# Patient Record
Sex: Female | Born: 1995 | Race: White | Hispanic: No | Marital: Single | Smoking: Current some day smoker
Health system: Southern US, Community
[De-identification: ages and names within clinical notes are randomized; demographics above are authoritative.]

## PROBLEM LIST (undated history)

## (undated) DIAGNOSIS — F419 Anxiety disorder, unspecified: Secondary | ICD-10-CM

## (undated) DIAGNOSIS — F909 Attention-deficit hyperactivity disorder, unspecified type: Secondary | ICD-10-CM

## (undated) DIAGNOSIS — R011 Cardiac murmur, unspecified: Secondary | ICD-10-CM

## (undated) DIAGNOSIS — F32A Depression, unspecified: Secondary | ICD-10-CM

## (undated) DIAGNOSIS — G43909 Migraine, unspecified, not intractable, without status migrainosus: Secondary | ICD-10-CM

## (undated) DIAGNOSIS — F329 Major depressive disorder, single episode, unspecified: Secondary | ICD-10-CM

## (undated) HISTORY — DX: Migraine, unspecified, not intractable, without status migrainosus: G43.909

## (undated) HISTORY — PX: BIOPSY THYROID: PRO38

## (undated) HISTORY — DX: Major depressive disorder, single episode, unspecified: F32.9

## (undated) HISTORY — DX: Attention-deficit hyperactivity disorder, unspecified type: F90.9

## (undated) HISTORY — DX: Anxiety disorder, unspecified: F41.9

## (undated) HISTORY — DX: Cardiac murmur, unspecified: R01.1

## (undated) HISTORY — DX: Depression, unspecified: F32.A

---
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
## 2016-03-15 ENCOUNTER — Ambulatory Visit: Payer: BLUE CROSS/BLUE SHIELD | Admitting: Certified Nurse Midwife

## 2016-03-16 ENCOUNTER — Ambulatory Visit (INDEPENDENT_AMBULATORY_CARE_PROVIDER_SITE_OTHER): Payer: BLUE CROSS/BLUE SHIELD | Admitting: Certified Nurse Midwife

## 2016-03-16 ENCOUNTER — Encounter: Payer: Self-pay | Admitting: Certified Nurse Midwife

## 2016-03-16 VITALS — BP 110/70 | HR 70 | Resp 16 | Ht 68.25 in | Wt 156.0 lb

## 2016-03-16 DIAGNOSIS — Z01419 Encounter for gynecological examination (general) (routine) without abnormal findings: Secondary | ICD-10-CM

## 2016-03-16 DIAGNOSIS — K5900 Constipation, unspecified: Secondary | ICD-10-CM | POA: Diagnosis not present

## 2016-03-16 DIAGNOSIS — Z0000 Encounter for general adult medical examination without abnormal findings: Secondary | ICD-10-CM

## 2016-03-16 LAB — POCT URINALYSIS DIPSTICK
Bilirubin, UA: NEGATIVE
Blood, UA: NEGATIVE
Glucose, UA: NEGATIVE
Ketones, UA: NEGATIVE
Leukocytes, UA: NEGATIVE
Nitrite, UA: NEGATIVE
Protein, UA: NEGATIVE
Urobilinogen, UA: NEGATIVE
pH, UA: 5

## 2016-03-16 NOTE — Patient Instructions (Signed)
General topics  Next pap or exam is  due in 1 year Take a Women's multivitamin Take 1200 mg. of calcium daily - prefer dietary If any concerns in interim to call back  Breast Self-Awareness Practicing breast self-awareness may pick up problems early, prevent significant medical complications, and possibly save your life. By practicing breast self-awareness, you can become familiar with how your breasts look and feel and if your breasts are changing. This allows you to notice changes early. It can also offer you some reassurance that your breast health is good. One way to learn what is normal for your breasts and whether your breasts are changing is to do a breast self-exam. If you find a lump or something that was not present in the past, it is best to contact your caregiver right away. Other findings that should be evaluated by your caregiver include nipple discharge, especially if it is bloody; skin changes or reddening; areas where the skin seems to be pulled in (retracted); or new lumps and bumps. Breast pain is seldom associated with cancer (malignancy), but should also be evaluated by a caregiver. BREAST SELF-EXAM The best time to examine your breasts is 5 7 days after your menstrual period is over.  ExitCare Patient Information 2013 [employer].   Exercise to Stay Healthy Exercise helps you become and stay healthy. EXERCISE IDEAS AND TIPS Choose exercises that:  You enjoy.  Fit into your day. You do not need to exercise really hard to be healthy. You can do exercises at a slow or medium level and stay healthy. You can:  Stretch before and after working out.  Try yoga, Pilates, or tai chi.  Lift weights.  Walk fast, swim, jog, run, climb stairs, bicycle, dance, or rollerskate.  Take aerobic classes. Exercises that burn about 150 calories:  Running 1  miles in 15 minutes.  Playing volleyball for 45 to 60 minutes.  Washing and waxing a car for 45 to 60  minutes.  Playing touch football for 45 minutes.  Walking 1  miles in 35 minutes.  Pushing a stroller 1  miles in 30 minutes.  Playing basketball for 30 minutes.  Raking leaves for 30 minutes.  Bicycling 5 miles in 30 minutes.  Walking 2 miles in 30 minutes.  Dancing for 30 minutes.  Shoveling snow for 15 minutes.  Swimming laps for 20 minutes.  Walking up stairs for 15 minutes.  Bicycling 4 miles in 15 minutes.  Gardening for 30 to 45 minutes.  Jumping rope for 15 minutes.  Washing windows or floors for 45 to 60 minutes. Document Released: 01/15/2011 Document Revised: 03/06/2012 Document Reviewed: 01/15/2011 ExitCare Patient Information 2013 ExitCare[employer].   Other topics ( that may be useful information):    Sexually Transmitted Disease Sexually transmitted disease (STD) refers to any infection that is passed from person to person during sexual activity. This may happen by way of saliva, semen, blood, vaginal mucus, or urine. Common STDs include:  Gonorrhea.  Chlamydia.  Syphilis.  HIV/AIDS.  Genital herpes.  Hepatitis B and C.  Trichomonas.  Human papillomavirus (HPV).  Pubic lice. CAUSES  An STD may be spread by bacteria, virus, or parasite. A person can get an STD by:  Sexual intercourse with an infected person.  Sharing sex toys with an infected person.  Sharing needles with an infected person.  Having intimate contact with the genitals, mouth, or rectal areas of an infected person. SYMPTOMS  Some people may not have any symptoms, but   they can still pass the infection to others. Different STDs have different symptoms. Symptoms include:  Painful or bloody urination.  Pain in the pelvis, abdomen, vagina, anus, throat, or eyes.  Skin rash, itching, irritation, growths, or sores (lesions). These usually occur in the genital or anal area.  Abnormal vaginal discharge.  Penile discharge in men.  Soft, flesh-colored skin growths in the  genital or anal area.  Fever.  Pain or bleeding during sexual intercourse.  Swollen glands in the groin area.  Yellow skin and eyes (jaundice). This is seen with hepatitis. DIAGNOSIS  To make a diagnosis, your caregiver may:  Take a medical history.  Perform a physical exam.  Take a specimen (culture) to be examined.  Examine a sample of discharge under a microscope.  Perform blood test TREATMENT   Chlamydia, gonorrhea, trichomonas, and syphilis can be cured with antibiotic medicine.  Genital herpes, hepatitis, and HIV can be treated, but not cured, with prescribed medicines. The medicines will lessen the symptoms.  Genital warts from HPV can be treated with medicine or by freezing, burning (electrocautery), or surgery. Warts may come back.  HPV is a virus and cannot be cured with medicine or surgery.However, abnormal areas may be followed very closely by your caregiver and may be removed from the cervix, vagina, or vulva through office procedures or surgery. If your diagnosis is confirmed, your recent sexual partners need treatment. This is true even if they are symptom-free or have a negative culture or evaluation. They should not have sex until their caregiver says it is okay. HOME CARE INSTRUCTIONS  All sexual partners should be informed, tested, and treated for all STDs.  Take your antibiotics as directed. Finish them even if you start to feel better.  Only take over-the-counter or prescription medicines for pain, discomfort, or fever as directed by your caregiver.  Rest.  Eat a balanced diet and drink enough fluids to keep your urine clear or pale yellow.  Do not have sex until treatment is completed and you have followed up with your caregiver. STDs should be checked after treatment.  Keep all follow-up appointments, Pap tests, and blood tests as directed by your caregiver.  Only use latex condoms and water-soluble lubricants during sexual activity. Do not use  petroleum jelly or oils.  Avoid alcohol and illegal drugs.  Get vaccinated for HPV and hepatitis. If you have not received these vaccines in the past, talk to your caregiver about whether one or both might be right for you.  Avoid risky sex practices that can break the skin. The only way to avoid getting an STD is to avoid all sexual activity.Latex condoms and dental dams (for oral sex) will help lessen the risk of getting an STD, but will not completely eliminate the risk. SEEK MEDICAL CARE IF:   You have a fever.  You have any new or worsening symptoms. Document Released: 03/05/2003 Document Revised: 03/06/2012 Document Reviewed: 03/12/2011 [facility] Patient Information 2013 [place].    Domestic Abuse You are being battered or abused if someone close to you hits, pushes, or physically hurts you in any way. You also are being abused if you are forced into activities. You are being sexually abused if you are forced to have sexual contact of any kind. You are being emotionally abused if you are made to feel worthless or if you are constantly threatened. It is important to remember that help is available. No one has the right to abuse you. PREVENTION OF FURTHER  ABUSE  Learn the warning signs of danger. This varies with situations but may include: the use of alcohol, threats, isolation from friends and family, or forced sexual contact. Leave if you feel that violence is going to occur.  If you are attacked or beaten, report it to the police so the abuse is documented. You do not have to press charges. The police can protect you while you or the attackers are leaving. Get the officer's name and badge number and a copy of the report.  Find someone you can trust and tell them what is happening to you: your caregiver, a nurse, clergy member, close friend or family member. Feeling ashamed is natural, but remember that you have done nothing wrong. No one deserves abuse. Document Released:  12/10/2000 Document Revised: 03/06/2012 Document Reviewed: 02/18/2011 ExitCare Patient Information 2013 ExitCare[employer].    How Much is Too Much Alcohol? Drinking too much alcohol can cause injury, accidents, and health problems. These types of problems can include:   Car crashes.  Falls.  Family fighting (domestic violence).  Drowning.  Fights.  Injuries.  Burns.  Damage to certain organs.  Having a baby with birth defects. ONE DRINK CAN BE TOO MUCH WHEN YOU ARE:  Working.  Pregnant or breastfeeding.  Taking medicines. Ask your doctor.  Driving or planning to drive. If you or someone you know has a drinking problem, get help from a doctor.  Document Released: 10/09/2009 Document Revised: 03/06/2012 Document Reviewed: 10/09/2009 ExitCare Patient Information 2013 ExitCare[employer].   Smoking Hazards Smoking cigarettes is extremely bad for your health. Tobacco smoke has over 200 known poisons in it. There are over 60 chemicals in tobacco smoke that cause cancer. Some of the chemicals found in cigarette smoke include:   Cyanide.  Benzene.  Formaldehyde.  Methanol (wood alcohol).  Acetylene (fuel used in welding torches).  Ammonia. Cigarette smoke also contains the poisonous gases nitrogen oxide and carbon monoxide.  Cigarette smokers have an increased risk of many serious medical problems and Smoking causes approximately:  90% of all lung cancer deaths in men.  80% of all lung cancer deaths in women.  90% of deaths from chronic obstructive lung disease. Compared with nonsmokers, smoking increases the risk of:  Coronary heart disease by 2 to 4 times.  Stroke by 2 to 4 times.  Men developing lung cancer by 23 times.  Women developing lung cancer by 13 times.  Dying from chronic obstructive lung diseases by 12 times.  . Smoking is the most preventable cause of death and disease in our society.  WHY IS SMOKING ADDICTIVE?  Nicotine is the chemical  agent in tobacco that is capable of causing addiction or dependence.  When you smoke and inhale, nicotine is absorbed rapidly into the bloodstream through your lungs. Nicotine absorbed through the lungs is capable of creating a powerful addiction. Both inhaled and non-inhaled nicotine may be addictive.  Addiction studies of cigarettes and spit tobacco show that addiction to nicotine occurs mainly during the teen years, when young people begin using tobacco products. WHAT ARE THE BENEFITS OF QUITTING?  There are many health benefits to quitting smoking.   Likelihood of developing cancer and heart disease decreases. Health improvements are seen almost immediately.  Blood pressure, pulse rate, and breathing patterns start returning to normal soon after quitting. QUITTING SMOKING   American Lung Association - 1-800-LUNGUSA  American Cancer Society - 1-800-ACS-2345 Document Released: 01/20/2005 Document Revised: 03/06/2012 Document Reviewed: 09/24/2009 ExitCare Patient Information 2013 ExitCare,   LLC.   Stress Management Stress is a state of physical or mental tension that often results from changes in your life or normal routine. Some common causes of stress are:  Death of a loved one.  Injuries or severe illnesses.  Getting fired or changing jobs.  Moving into a new home. Other causes may be:  Sexual problems.  Business or financial losses.  Taking on a large debt.  Regular conflict with someone at home or at work.  Constant tiredness from lack of sleep. It is not just bad things that are stressful. It may be stressful to:  Win the lottery.  Get married.  Buy a new car. The amount of stress that can be easily tolerated varies from person to person. Changes generally cause stress, regardless of the types of change. Too much stress can affect your health. It may lead to physical or emotional problems. Too little stress (boredom) may also become stressful. SUGGESTIONS TO  REDUCE STRESS:  Talk things over with your family and friends. It often is helpful to share your concerns and worries. If you feel your problem is serious, you may want to get help from a professional counselor.  Consider your problems one at a time instead of lumping them all together. Trying to take care of everything at once may seem impossible. List all the things you need to do and then start with the most important one. Set a goal to accomplish 2 or 3 things each day. If you expect to do too many in a single day you will naturally fail, causing you to feel even more stressed.  Do not use alcohol or drugs to relieve stress. Although you may feel better for a short time, they do not remove the problems that caused the stress. They can also be habit forming.  Exercise regularly - at least 3 times per week. Physical exercise can help to relieve that "uptight" feeling and will relax you.  The shortest distance between despair and hope is often a good night's sleep.  Go to bed and get up on time allowing yourself time for appointments without being rushed.  Take a short "time-out" period from any stressful situation that occurs during the day. Close your eyes and take some deep breaths. Starting with the muscles in your face, tense them, hold it for a few seconds, then relax. Repeat this with the muscles in your neck, shoulders, hand, stomach, back and legs.  Take good care of yourself. Eat a balanced diet and get plenty of rest.  Schedule time for having fun. Take a break from your daily routine to relax. HOME CARE INSTRUCTIONS   Call if you feel overwhelmed by your problems and feel you can no longer manage them on your own.  Return immediately if you feel like hurting yourself or someone else. Document Released: 06/08/2001 Document Revised: 03/06/2012 Document Reviewed: 01/29/2008 [facility] Patient Information 2013 [place].  Constipation, Adult Constipation is when a person has  fewer than three bowel movements a week, has difficulty having a bowel movement, or has stools that are dry, hard, or larger than normal. As people grow older, constipation is more common. A low-fiber diet, not taking in enough fluids, and taking certain medicines may make constipation worse.  CAUSES   Certain medicines, such as antidepressants, pain medicine, iron supplements, antacids, and water pills.   Certain diseases, such as diabetes, irritable bowel syndrome (IBS), thyroid disease, or depression.   Not drinking enough water.   Not  eating enough fiber-rich foods.   Stress or travel.   Lack of physical activity or exercise.   Ignoring the urge to have a bowel movement.   Using laxatives too much.  SIGNS AND SYMPTOMS   Having fewer than three bowel movements a week.   Straining to have a bowel movement.   Having stools that are hard, dry, or larger than normal.   Feeling full or bloated.   Pain in the lower abdomen.   Not feeling relief after having a bowel movement.  DIAGNOSIS  Your health care provider will take a medical history and perform a physical exam. Further testing may be done for severe constipation. Some tests may include:  A barium enema X-ray to examine your rectum, colon, and, sometimes, your small intestine.   A sigmoidoscopy to examine your lower colon.   A colonoscopy to examine your entire colon. TREATMENT  Treatment will depend on the severity of your constipation and what is causing it. Some dietary treatments include drinking more fluids and eating more fiber-rich foods. Lifestyle treatments may include regular exercise. If these diet and lifestyle recommendations do not help, your health care provider may recommend taking over-the-counter laxative medicines to help you have bowel movements. Prescription medicines may be prescribed if over-the-counter medicines do not work.  HOME CARE INSTRUCTIONS   Eat foods that have a lot of  fiber, such as fruits, vegetables, whole grains, and beans.  Limit foods high in fat and processed sugars, such as french fries, hamburgers, cookies, candies, and soda.   A fiber supplement may be added to your diet if you cannot get enough fiber from foods.   Drink enough fluids to keep your urine clear or pale yellow.   Exercise regularly or as directed by your health care provider.   Go to the restroom when you have the urge to go. Do not hold it.   Only take over-the-counter or prescription medicines as directed by your health care provider. Do not take other medicines for constipation without talking to your health care provider first.  [place] IF:   You have bright red blood in your stool.   Your constipation lasts for more than 4 days or gets worse.   You have abdominal or rectal pain.   You have thin, pencil-like stools.   You have unexplained weight loss. MAKE SURE YOU:   Understand these instructions.  Will watch your condition.  Will get help right away if you are not doing well or get worse.   This information is not intended to replace advice given to you by your health care provider. Make sure you discuss any questions you have with your health care provider.   Document Released: 09/10/2004 Document Revised: 01/03/2015 Document Reviewed: 09/24/2013 Elsevier Interactive Patient Education [employer].

## 2016-03-16 NOTE — Progress Notes (Signed)
20 y.o. G0P0000 Single  Caucasian Fe here for annual exam. Periods normal to slightly heavy and then occasional missed period. Patient is experiencing constipation with gas pain and discomfort at times, working on diet to help with. Not sure she wants to stay with IUD, but will at present due to being senior in high school. Denies any pelvic pain, just has noted increase acne and feels related to IUD. Sees PCP prn. Not sexually active, no STD concerns or testing needed. No other health issues today.  No LMP recorded. Patient is not currently having periods (Reason: IUD).          Sexually active: No.  The current method of family planning is IUD.    Exercising: Yes.    [place] Smoker:  no  Health Maintenance: Pap:  none MMG:  none Colonoscopy:  none BMD:   none TDaP:  Less than 6077yrs Shingles: no Pneumonia: no Hep C and HIV: HIV neg 2016 Labs: poct urine-neg, hgb-12.0 Self breast exam: not done   reports that she has never smoked. She has never used smokeless tobacco. She reports that she does not drink alcohol or use illicit drugs.  Past Medical History  Diagnosis Date  . Migraines     with aura  . Anxiety   . Heart murmur     Past Surgical History  Procedure Laterality Date  . Biopsy thyroid      benign  . Skyla      insertion 04-18-15    Current Outpatient Prescriptions  Medication Sig Dispense Refill  . Levonorgestrel (SKYLA) 13.5 MG IUD by Intrauterine route.     No current facility-administered medications for this visit.    Family History  Problem Relation Age of Onset  . Thyroid disease Mother     hypo  . Aneurysm Maternal Aunt   . Aneurysm Maternal Grandmother     ROS:  Pertinent items are noted in HPI.  Otherwise, a comprehensive ROS was negative.  Exam:   BP 110/70 mmHg  Pulse 70  Resp 16  Ht 5' 8.25" (1.734 m)  Wt 156 lb (70.761 kg)  BMI 23.53 kg/m2 Height: 5' 8.25" (173.4 cm) Ht Readings from Last 3 Encounters:  03/16/16 5' 8.25" (1.734 m)  (94 %*, Z = 1.58)  05/23/15 5' 7.75" (1.721 m) (92 %*, Z = 1.40)  04/18/15 5' 8.25" (1.734 m) (95 %*, Z = 1.60)   * Growth percentiles are based on CDC 2-20 Years data.    General appearance: alert, cooperative and appears stated age Head: Normocephalic, without obvious abnormality, atraumatic Neck: no adenopathy, supple, symmetrical, trachea midline and thyroid mild enlargement Lungs: clear to auscultation bilaterally Breasts: normal appearance, no masses or tenderness, No nipple retraction or dimpling, No nipple discharge or bleeding, No axillary or supraclavicular adenopathy Heart: regular rate and rhythm Abdomen: soft, non-tender; no masses,  no organomegaly Extremities: extremities normal, atraumatic, no cyanosis or edema Skin: Skin color, texture, turgor normal. No rashes or lesions Lymph nodes: Cervical, supraclavicular, and axillary nodes normal. No abnormal inguinal nodes palpated Neurologic: Grossly normal   Pelvic: External genitalia:  no lesions              Urethra:  normal appearing urethra with no masses, tenderness or lesions              Bartholin's and Skene's: normal                 Vagina: normal appearing vagina with normal color and discharge,  no lesions              Cervix: normal,non tender, no lesions, IUD string noted in cervix              Pap taken: No. Bimanual Exam:  Uterus:  normal size, contour, position, consistency, mobility, non-tender and anteverted              Adnexa: normal adnexa and no mass, fullness, tenderness               Rectovaginal: Confirms               Anus:  normal appearance  Chaperone present: yes  A:  Well Woman with normal exam  Contraception Skyla IUD removal due 04/17/18  Constipation ? Stress with school  P:   Reviewed health and wellness pertinent to exam  Reviewed warning signs with IUD and discussed bleeding profile expectations with IUD and what she is having is normal. Hgb. Normal , so no anemia.  Lab  TSH  Discussed constipation and etiology of. Discussed increasing roughage, fresh fruit and vegetables and increase water intake, decrease soda to re establish normal bowel habits. Discussed daily probiotic use, given information on, and on constipation warning signs. Will see PCP if continues. Questions addressed.  Pap smear as above not taken   counseled on breast self exam, STD prevention, HIV risk factors and prevention, adequate intake of calcium and vitamin D, diet and exercise  return annually or prn  An After Visit Summary was printed and given to the patient.

## 2016-03-17 LAB — TSH: TSH: 0.71 mIU/L (ref 0.50–4.30)

## 2016-03-17 NOTE — Progress Notes (Signed)
Encounter reviewed Ane Conerly, MD   

## 2016-03-19 LAB — HEMOGLOBIN, FINGERSTICK: Hemoglobin, fingerstick: 12 g/dL (ref 12.0–16.0)

## 2016-03-30 DIAGNOSIS — R05 Cough: Secondary | ICD-10-CM | POA: Diagnosis not present

## 2016-03-30 DIAGNOSIS — R42 Dizziness and giddiness: Secondary | ICD-10-CM | POA: Diagnosis not present

## 2016-04-21 DIAGNOSIS — J988 Other specified respiratory disorders: Secondary | ICD-10-CM | POA: Diagnosis not present

## 2016-06-02 DIAGNOSIS — J029 Acute pharyngitis, unspecified: Secondary | ICD-10-CM | POA: Diagnosis not present

## 2016-08-06 DIAGNOSIS — Z23 Encounter for immunization: Secondary | ICD-10-CM | POA: Diagnosis not present

## 2017-01-15 DIAGNOSIS — J101 Influenza due to other identified influenza virus with other respiratory manifestations: Secondary | ICD-10-CM | POA: Diagnosis not present

## 2017-01-15 DIAGNOSIS — R509 Fever, unspecified: Secondary | ICD-10-CM | POA: Diagnosis not present

## 2017-02-17 ENCOUNTER — Ambulatory Visit (INDEPENDENT_AMBULATORY_CARE_PROVIDER_SITE_OTHER): Payer: BLUE CROSS/BLUE SHIELD | Admitting: Certified Nurse Midwife

## 2017-02-17 VITALS — BP 96/70 | HR 88 | Temp 98.6°F | Resp 16 | Ht 68.25 in | Wt 159.0 lb

## 2017-02-17 DIAGNOSIS — N3001 Acute cystitis with hematuria: Secondary | ICD-10-CM

## 2017-02-17 DIAGNOSIS — N920 Excessive and frequent menstruation with regular cycle: Secondary | ICD-10-CM | POA: Diagnosis not present

## 2017-02-17 DIAGNOSIS — R300 Dysuria: Secondary | ICD-10-CM | POA: Diagnosis not present

## 2017-02-17 LAB — POCT URINALYSIS DIPSTICK
Bilirubin, UA: NEGATIVE
Glucose, UA: NEGATIVE
Ketones, UA: NEGATIVE
Nitrite, UA: NEGATIVE
Urobilinogen, UA: NEGATIVE
pH, UA: 5

## 2017-02-17 LAB — POCT URINE PREGNANCY: Preg Test, Ur: NEGATIVE

## 2017-02-17 MED ORDER — PHENAZOPYRIDINE HCL 100 MG PO TABS: 100.0000 mg | ORAL_TABLET | Freq: Three times a day (TID) | ORAL | 0 refills | Status: DC | PRN

## 2017-02-17 MED ORDER — NITROFURANTOIN MONOHYD MACRO 100 MG PO CAPS: 100.0000 mg | ORAL_CAPSULE | Freq: Two times a day (BID) | ORAL | 0 refills | Status: DC

## 2017-02-17 NOTE — Progress Notes (Signed)
21 y.o. Single Caucasian female G0P0000 here with complaint of UTI, with onset  on 2-3 days ago. Patient complaining of urinary frequency/urgency/ and pain with urination. Patient denies fever, chills, nausea or back pain. No new personal products. Patient feels not to sexual activity. Denies any vaginal symptoms.    Contraception is Skyla IUD. Denies STD concerns or testing needed. Patient also wants to change contraception. Has continued to have dysmenorrhea with use and heavier periods. Inserted in 04/18/15. Would like to have removal. Interested in Nexplanon or trial of OCP again. She feels she maybe able to more consistent with OCP use. No other health concerns today.  ROS: Pertinent to HPI  O: Healthy female WDWN Affect: Normal, orientation x 3 Skin : warm and dry CVAT: negative bilateral Abdomen: positive for suprapubic tenderness  Pelvic exam: External genital area: normal, no lesions Bladder,Urethra tender, Urethral meatus: tender, red Vagina: normal vaginal discharge, normal appearance  Wet prep not taken Cervix: normal, non tender, IUD string noted in cervix Uterus:normal,non tender Adnexa: normal non tender, no fullness or masses POCT urine : 1+ RBC, WBC+  A: Acute cystitis Normal pelvic exam  P: Reviewed findings of UTI and need for treatment. Rx:Rx Macrobid see order with instructions Rx Pyridium see order with instructions ZOX:[id number]:Urine micro, culture Reviewed warning signs and symptoms of UTI and need to advise if occurring. Limit thong use which can contribute to UTI Encouraged to limit soda, tea, and coffee and be sure to increase water intake.  Discussed risks/benefits of Nexplanon, insertion/removal and bleeding profile expectations. Questions addressed at length. Patient also was shown the device. Discussed may have same or similar bleeding profile as with IUD. Discussed risks/benefits of OCP, expectations with cycle control and PMS may improve with use. Importance of  consistent use for contraception as well as improved cycle control. Discussed if not consistent cycles may be worse. Questions addressed at length. Information given. Patient will advise her decision and call. Will schedule removal of IUD at same time as Nexplanon insertion if she decides on this. Otherwise if OCP can  RX at removal.  20 minutes in consultation regarding contraception.  Rv prn   RV prn

## 2017-02-17 NOTE — Patient Instructions (Signed)
Oral Contraception Use Oral contraceptive pills (OCPs) are medicines taken to prevent pregnancy. OCPs work by preventing the ovaries from releasing eggs. The hormones in OCPs also cause the cervical mucus to thicken, preventing the sperm from entering the uterus. The hormones also cause the uterine lining to become thin, not allowing a fertilized egg to attach to the inside of the uterus. OCPs are highly effective when taken exactly as prescribed. However, OCPs do not prevent sexually transmitted diseases (STDs). Safe sex practices, such as using condoms along with an OCP, can help prevent STDs. Before taking OCPs, you may have a physical exam and Pap test. Your health care provider may also order blood tests if necessary. Your health care provider will make sure you are a good candidate for oral contraception. Discuss with your health care provider the possible side effects of the OCP you may be prescribed. When starting an OCP, it can take 2 to 3 months for the body to adjust to the changes in hormone levels in your body. How to take oral contraceptive pills Your health care provider may advise you on how to start taking the first cycle of OCPs. Otherwise, you can:  Start on day 1 of your menstrual period. You will not need any backup contraceptive protection with this start time.  Start on the first Sunday after your menstrual period or the day you get your prescription. In these cases, you will need to use backup contraceptive protection for the first week.  Start the pill at any time of your cycle. If you take the pill within 5 days of the start of your period, you are protected against pregnancy right away. In this case, you will not need a backup form of birth control. If you start at any other time of your menstrual cycle, you will need to use another form of birth control for 7 days. If your OCP is the type called a minipill, it will protect you from pregnancy after taking it for 2 days (48  hours). After you have started taking OCPs:  If you forget to take 1 pill, take it as soon as you remember. Take the next pill at the regular time.  If you miss 2 or more pills, call your health care provider because different pills have different instructions for missed doses. Use backup birth control until your next menstrual period starts.  If you use a 28-day pack that contains inactive pills and you miss 1 of the last 7 pills (pills with no hormones), it will not matter. Throw away the rest of the non-hormone pills and start a new pill pack. No matter which day you start the OCP, you will always start a new pack on that same day of the week. Have an extra pack of OCPs and a backup contraceptive method available in case you miss some pills or lose your OCP pack. Follow these instructions at home:  Do not smoke.  Always use a condom to protect against STDs. OCPs do not protect against STDs.  Use a calendar to mark your menstrual period days.  Read the information and directions that came with your OCP. Talk to your health care provider if you have questions. Contact a health care provider if:  You develop nausea and vomiting.  You have abnormal vaginal discharge or bleeding.  You develop a rash.  You miss your menstrual period.  You are losing your hair.  You need treatment for mood swings or depression.  You get dizzy  when taking the OCP.  You develop acne from taking the OCP.  You become pregnant. Get help right away if:  You develop chest pain.  You develop shortness of breath.  You have an uncontrolled or severe headache.  You develop numbness or slurred speech.  You develop visual problems.  You develop pain, redness, and swelling in the legs. This information is not intended to replace advice given to you by your health care provider. Make sure you discuss any questions you have with your health care provider. Document Released: 12/02/2011 Document Revised:  05/20/2016 Document Reviewed: 06/03/2013 Elsevier Interactive Patient Education  2017 [employer] Contraceptive Implant Information A contraceptive implant is a plastic rod that is inserted under your skin. It is usually inserted under the skin of your upper arm. It continually releases small amounts of progestin (synthetic progesterone) into your bloodstream. This prevents an egg from being released from your ovaries. It also thickens your cervical mucus to prevent sperm from entering the cervix, and it thins your uterine lining to prevent a fertilized egg from attaching to your uterus. Contraceptive implants can be effective for up to 3 years. They do not provide protection against sexually transmitted diseases (STDs).  The procedure to insert an implant usually takes about 10 minutes. There may be minor bruising, swelling, and discomfort at the insertion site for a couple days. The implant begins to work within the first day. Other contraceptive protection may be necessary for 7 days. Be sure to discuss with your health care provider if you need a backup method of contraception.  Your health care provider will make sure you are a good candidate for the contraceptive implant. Discuss with your health care provider the possible side effects of the implant. ADVANTAGES  It prevents pregnancy for up to 3 years.  It is easily reversible.  It is convenient.  It can be used when breastfeeding.  It can be used by women who cannot take estrogen. DISADVANTAGES  You may have irregular or unplanned vaginal bleeding.  You may develop side effects, including headache, weight gain, acne, breast tenderness, or mood changes.  You may have tissue or nerve damage after insertion (rare).  It may be difficult and uncomfortable to remove.  Certain medicines may interfere with the effectiveness of the implant. REMOVAL OF IMPLANT The implant should be removed in 3 years or as directed by your health care  provider. The implant's effect wears off in a few hours after removal. Your ability to get pregnant (fertility) may be restored in 1-2 weeks. A new implant can be inserted as soon as the old one is removed if desired. CONTRAINDICATIONS You should not get the implant if you are experiencing any of the following situations:  You are pregnant.  You have a history of breast cancer, osteoporosis, blood clots, heart disease, diabetes, high blood pressure, liver disease, tumors, or stroke.   You have undiagnosed vaginal bleeding.  You have a sensitivity to any part of the implant. This information is not intended to replace advice given to you by your health care provider. Make sure you discuss any questions you have with your health care provider. Document Released: 12/02/2011 Document Revised: 08/15/2013 Document Reviewed: 06/11/2013 Elsevier Interactive Patient Education  2017 [employer]vier Inc. Urinary Tract Infection, Adult Introduction A urinary tract infection (UTI) is an infection of any part of the urinary tract. The urinary tract includes the:  Kidneys.  Ureters.  Bladder.  Urethra. These organs make, store, and get rid of  pee (urine) in the body. Follow these instructions at home:  Take over-the-counter and prescription medicines only as told by your doctor.  If you were prescribed an antibiotic medicine, take it as told by your doctor. Do not stop taking the antibiotic even if you start to feel better.  Avoid the following drinks:  Alcohol.  Caffeine.  Tea.  Carbonated drinks.  Drink enough fluid to keep your pee clear or pale yellow.  Keep all follow-up visits as told by your doctor. This is important.  Make sure to:  Empty your bladder often and completely. Do not to hold pee for long periods of time.  Empty your bladder before and after sex.  Wipe from front to back after a bowel movement if you are female. Use each tissue one time when you wipe. Contact a  doctor if:  You have back pain.  You have a fever.  You feel sick to your stomach (nauseous).  You throw up (vomit).  Your symptoms do not get better after 3 days.  Your symptoms go away and then come back. Get help right away if:  You have very bad back pain.  You have very bad lower belly (abdominal) pain.  You are throwing up and cannot keep down any medicines or water. This information is not intended to replace advice given to you by your health care provider. Make sure you discuss any questions you have with your health care provider. Document Released: 05/31/2008 Document Revised: 05/20/2016 Document Reviewed: 11/03/2015  2017 Elsevier

## 2017-02-18 ENCOUNTER — Encounter: Payer: Self-pay | Admitting: Certified Nurse Midwife

## 2017-02-18 LAB — URINALYSIS, MICROSCOPIC ONLY
Casts: NONE SEEN [LPF]
Crystals: NONE SEEN [HPF]
Yeast: NONE SEEN [HPF]

## 2017-02-18 NOTE — Progress Notes (Signed)
Encounter reviewed Jill Jertson, MD   

## 2017-02-20 LAB — URINE CULTURE

## 2017-02-21 ENCOUNTER — Telehealth: Payer: Self-pay | Admitting: *Deleted

## 2017-02-21 NOTE — Telephone Encounter (Signed)
LM for pt to call back.

## 2017-02-21 NOTE — Telephone Encounter (Signed)
-----   Message from Verner Choleborah S Leonard, CNM sent at 02/20/2017  7:43 PM EST ----- Notify patient that urine culture positive for E. Coli and Micro also positive for bacteria.  She is on appropriate medication. If symptoms persist after completion needs to call.

## 2017-02-22 NOTE — Telephone Encounter (Signed)
Left message for call back.

## 2017-02-22 NOTE — Telephone Encounter (Signed)
Patient is returning a call to Joy. °

## 2017-02-22 NOTE — Telephone Encounter (Signed)
Patient notified of results as written by provider 

## 2017-02-22 NOTE — Telephone Encounter (Signed)
Left message to call back  

## 2017-03-17 ENCOUNTER — Ambulatory Visit (INDEPENDENT_AMBULATORY_CARE_PROVIDER_SITE_OTHER): Payer: BLUE CROSS/BLUE SHIELD | Admitting: Certified Nurse Midwife

## 2017-03-17 ENCOUNTER — Encounter: Payer: Self-pay | Admitting: Certified Nurse Midwife

## 2017-03-17 VITALS — BP 104/70 | HR 64 | Temp 99.3°F | Resp 16 | Ht 67.75 in | Wt 163.0 lb

## 2017-03-17 DIAGNOSIS — Z0000 Encounter for general adult medical examination without abnormal findings: Secondary | ICD-10-CM | POA: Diagnosis not present

## 2017-03-17 DIAGNOSIS — Z30432 Encounter for removal of intrauterine contraceptive device: Secondary | ICD-10-CM

## 2017-03-17 DIAGNOSIS — Z01419 Encounter for gynecological examination (general) (routine) without abnormal findings: Secondary | ICD-10-CM | POA: Diagnosis not present

## 2017-03-17 LAB — POCT URINALYSIS DIPSTICK
Bilirubin, UA: NEGATIVE
Blood, UA: NEGATIVE
Glucose, UA: NEGATIVE
Ketones, UA: NEGATIVE
Leukocytes, UA: NEGATIVE
Nitrite, UA: NEGATIVE
Protein, UA: NEGATIVE
Urobilinogen, UA: NEGATIVE (ref ?–2.0)
pH, UA: 7 (ref 5.0–8.0)

## 2017-03-17 NOTE — Progress Notes (Signed)
21 y.o. G0P0000 Single  Caucasian Fe here for annual exam. Periods are heavier and more cramping with Skyla IUD and would like to have removed. Discussed options last time she was in and plan to do OCP.  No partner change, no STD screening needed. Having issues with allergies at present seen for at [facility]. Sees Psychiatrist for medication management of depression/anxiety. No other health issues today.  Patient's last menstrual period was 02/26/2017 (exact date).          Sexually active: Yes.    The current method of family planning is IUD.    Exercising: No.  exercise Smoker:  E-cigs  Health Maintenance: Pap:  none MMG:  none Colonoscopy:  none BMD:   none TDaP:  Less than10 Shingles: no Pneumonia: no Hep C and HIV: HIV neg 2016, Hep c neg 2015 Labs: poct urine-7.0 Self breast exam: not done   reports that she has been smoking E-cigarettes.  She has never used smokeless tobacco. She reports that she drinks about 1.2 oz of alcohol per week . She reports that she does not use drugs.  Past Medical History:  Diagnosis Date  . Anxiety   . Depression   . Heart murmur   . Migraines    with aura    Past Surgical History:  Procedure Laterality Date  . BIOPSY THYROID     benign  . skyla     insertion 04-18-15    Current Outpatient Prescriptions  Medication Sig Dispense Refill  . buPROPion (WELLBUTRIN XL) 150 MG 24 hr tablet Take 1 tablet by mouth daily.    . busPIRone (BUSPAR) 5 MG tablet Take 1 tablet by mouth daily.    . Levonorgestrel (SKYLA) 13.5 MG IUD by Intrauterine route.     No current facility-administered medications for this visit.     Family History  Problem Relation Age of Onset  . Thyroid disease Mother     hypo  . Aneurysm Maternal Aunt   . Aneurysm Maternal Grandmother     ROS:  Pertinent items are noted in HPI.  Otherwise, a comprehensive ROS was negative.  Exam:   BP 104/70   Pulse 64   Temp 99.3 F (37.4 C) (Oral)   Resp 16   Ht 5'  7.75" (1.721 m)   Wt 163 lb (73.9 kg)   LMP 02/26/2017 (Exact Date)   BMI 24.97 kg/m  Height: 5' 7.75" (172.1 cm) Ht Readings from Last 3 Encounters:  03/17/17 5' 7.75" (1.721 m) (91 %, Z= 1.36)*  02/17/17 5' 8.25" (1.734 m) (94 %, Z= 1.56)*  03/16/16 5' 8.25" (1.734 m) (94 %, Z= 1.58)*   * Growth percentiles are based on CDC 2-20 Years data.    General appearance: alert, cooperative and appears stated age Head: Normocephalic, without obvious abnormality, atraumatic Neck: no adenopathy, supple, symmetrical, trachea midline and thyroid normal to inspection and palpation Lungs: clear to auscultation bilaterally Breasts: normal appearance, no masses or tenderness, No nipple retraction or dimpling, No nipple discharge or bleeding, No axillary or supraclavicular adenopathy Heart: regular rate and rhythm Abdomen: soft, non-tender; no masses,  no organomegaly Extremities: extremities normal, atraumatic, no cyanosis or edema Skin: Skin color, texture, turgor normal. No rashes or lesions Lymph nodes: Cervical, supraclavicular, and axillary nodes normal. No abnormal inguinal nodes palpated Neurologic: Grossly normal   Pelvic: External genitalia:  no lesions              Urethra:  normal appearing urethra with no  masses, tenderness or lesions              Bartholin's and Skene's: normal                 Vagina: normal appearing vagina with normal color and discharge, no lesions              Cervix: no cervical motion tenderness, no lesions and nulliparous appearance, IUD string present              Pap taken: No. Bimanual Exam:  Uterus:  normal size, contour, position, consistency, mobility, non-tender              Adnexa: normal adnexa and no mass, fullness, tenderness               Rectovaginal: Confirms               Anus:  normal sphincter tone, no lesions  Chaperone present: yes  A:  Well Woman with normal exam  Contraception Skyla IUD desires removal due to heavier periods and  cramping.  Contraception plan POP due to Migraine with aura  Depression/anxiety with MD management      P:   Reviewed health and wellness pertinent to exam  Patient will call with period for removal and will start POP at that time. She will be called with insurance information regarding removal.  Warning signs of IUD reviewed and need to advise if occurring.  Pap smear as above not taken   counseled on breast self exam, STD prevention, HIV risk factors and prevention, use and side effects of OCP's, adequate intake of calcium and vitamin D, diet and exercise  return annually or prn  An After Visit Summary was printed and given to the patient.

## 2017-03-17 NOTE — Patient Instructions (Signed)
Oral Contraception Use Oral contraceptive pills (OCPs) are medicines taken to prevent pregnancy. OCPs work by preventing the ovaries from releasing eggs. The hormones in OCPs also cause the cervical mucus to thicken, preventing the sperm from entering the uterus. The hormones also cause the uterine lining to become thin, not allowing a fertilized egg to attach to the inside of the uterus. OCPs are highly effective when taken exactly as prescribed. However, OCPs do not prevent sexually transmitted diseases (STDs). Safe sex practices, such as using condoms along with an OCP, can help prevent STDs. Before taking OCPs, you may have a physical exam and Pap test. Your health care provider may also order blood tests if necessary. Your health care provider will make sure you are a good candidate for oral contraception. Discuss with your health care provider the possible side effects of the OCP you may be prescribed. When starting an OCP, it can take 2 to 3 months for the body to adjust to the changes in hormone levels in your body. How to take oral contraceptive pills Your health care provider may advise you on how to start taking the first cycle of OCPs. Otherwise, you can:  Start on day 1 of your menstrual period. You will not need any backup contraceptive protection with this start time.  Start on the first Sunday after your menstrual period or the day you get your prescription. In these cases, you will need to use backup contraceptive protection for the first week.  Start the pill at any time of your cycle. If you take the pill within 5 days of the start of your period, you are protected against pregnancy right away. In this case, you will not need a backup form of birth control. If you start at any other time of your menstrual cycle, you will need to use another form of birth control for 7 days. If your OCP is the type called a minipill, it will protect you from pregnancy after taking it for 2 days (48  hours).  After you have started taking OCPs:  If you forget to take 1 pill, take it as soon as you remember. Take the next pill at the regular time.  If you miss 2 or more pills, call your health care provider because different pills have different instructions for missed doses. Use backup birth control until your next menstrual period starts.  If you use a 28-day pack that contains inactive pills and you miss 1 of the last 7 pills (pills with no hormones), it will not matter. Throw away the rest of the non-hormone pills and start a new pill pack.  No matter which day you start the OCP, you will always start a new pack on that same day of the week. Have an extra pack of OCPs and a backup contraceptive method available in case you miss some pills or lose your OCP pack. Follow these instructions at home:  Do not smoke.  Always use a condom to protect against STDs. OCPs do not protect against STDs.  Use a calendar to mark your menstrual period days.  Read the information and directions that came with your OCP. Talk to your health care provider if you have questions. Contact a health care provider if:  You develop nausea and vomiting.  You have abnormal vaginal discharge or bleeding.  You develop a rash.  You miss your menstrual period.  You are losing your hair.  You need treatment for mood swings or depression.  You   get dizzy when taking the OCP.  You develop acne from taking the OCP.  You become pregnant. Get help right away if:  You develop chest pain.  You develop shortness of breath.  You have an uncontrolled or severe headache.  You develop numbness or slurred speech.  You develop visual problems.  You develop pain, redness, and swelling in the legs. This information is not intended to replace advice given to you by your health care provider. Make sure you discuss any questions you have with your health care provider. Document Released: 12/02/2011 Document  Revised: 05/20/2016 Document Reviewed: 06/03/2013 Elsevier Interactive Patient Education  2017 Elsevier Inc.  

## 2017-03-20 NOTE — Progress Notes (Signed)
Encounter reviewed Amanda Squitieri, MD   

## 2017-03-28 ENCOUNTER — Telehealth: Payer: Self-pay | Admitting: Certified Nurse Midwife

## 2017-03-28 NOTE — Telephone Encounter (Signed)
Patient is calling to schedule her IUD insertion. Patient has started her cycle.

## 2017-03-28 NOTE — Telephone Encounter (Signed)
Spoke with patient. Patient states that she would like to schedule her IUD removal and start on POP.Appointment for IUD removal scheduled for 03/30/2017 at 10:30 am with Leota Sauers CNM. Patient is agreeable to date and time. Colposcopy post procedure.  Instructions given. Motrin 800 mg po x , one hour before appointment with food. Make sure to eat a meal before appointment and drink plenty of fluids.   Routing to provider for final review. Patient agreeable to disposition. Will close encounter.

## 2017-03-28 NOTE — Telephone Encounter (Signed)
Message left to return call to Emily at [phone].    

## 2017-03-30 ENCOUNTER — Ambulatory Visit (INDEPENDENT_AMBULATORY_CARE_PROVIDER_SITE_OTHER): Payer: BLUE CROSS/BLUE SHIELD | Admitting: Certified Nurse Midwife

## 2017-03-30 ENCOUNTER — Ambulatory Visit: Payer: BLUE CROSS/BLUE SHIELD | Admitting: Certified Nurse Midwife

## 2017-03-30 ENCOUNTER — Encounter: Payer: Self-pay | Admitting: Certified Nurse Midwife

## 2017-03-30 VITALS — BP 98/60 | HR 60 | Resp 16 | Ht 67.75 in | Wt 162.0 lb

## 2017-03-30 DIAGNOSIS — Z30011 Encounter for initial prescription of contraceptive pills: Secondary | ICD-10-CM

## 2017-03-30 DIAGNOSIS — Z30432 Encounter for removal of intrauterine contraceptive device: Secondary | ICD-10-CM

## 2017-03-30 MED ORDER — NORETHIN ACE-ETH ESTRAD-FE 1-20 MG-MCG PO TABS: 1.0000 | ORAL_TABLET | Freq: Every day | ORAL | 11 refills | Status: DC

## 2017-03-30 NOTE — Progress Notes (Signed)
20 yrsCaucasianSingleG0P0000LMP      Presents for [facility] IUD removal.  Denies any vaginal symptoms or STD concerns.  Plans for contraception are oral contraception. No health issues today.  LMPApril 1,2018       HPI neg. Healthy [place] female Affect: normal, orientation x 3  Abdomen: soft non-tender Groin:no inguinal nodes palpated    Pelvic exam:Pelvic exam: VULVA: normal appearing vulva with no masses, tenderness or lesions, VAGINA: normal appearing vagina with normal color and discharge, no lesions, blood noted in vagina, CERVIX: normal appearing cervix without discharge or lesions, blood present from cervix and IUD string noted, UTERUS: uterus is normal size, shape, consistency and nontender, ADNEXA: normal adnexa in size, nontender and no masses.  Procedure: Speculum placed, cervix visualized and cleansed with Betadine solution..  IUD string visualized, grasp with ring forceps, with gentle traction IUD removed intact.  IUD shown to patient and discarded. Speculum removed.   Assessment: Skyla IUD removal Pt tolerated procedure well.  Plan: Begin contraceptive choice of oral contraception today,use condoms consistently for at least one month or next period occurs. Discussed warning signs of OCP and need to advise if occurs. Handout given on use.  Rx Loestrin 1/20 Fe  See order with instructions  Questions addressed  Return Visit: 3 months to check BP, prn

## 2017-03-30 NOTE — Patient Instructions (Signed)
Oral Contraception Use Oral contraceptive pills (OCPs) are medicines taken to prevent pregnancy. OCPs work by preventing the ovaries from releasing eggs. The hormones in OCPs also cause the cervical mucus to thicken, preventing the sperm from entering the uterus. The hormones also cause the uterine lining to become thin, not allowing a fertilized egg to attach to the inside of the uterus. OCPs are highly effective when taken exactly as prescribed. However, OCPs do not prevent sexually transmitted diseases (STDs). Safe sex practices, such as using condoms along with an OCP, can help prevent STDs. Before taking OCPs, you may have a physical exam and Pap test. Your health care provider may also order blood tests if necessary. Your health care provider will make sure you are a good candidate for oral contraception. Discuss with your health care provider the possible side effects of the OCP you may be prescribed. When starting an OCP, it can take 2 to 3 months for the body to adjust to the changes in hormone levels in your body. How to take oral contraceptive pills Your health care provider may advise you on how to start taking the first cycle of OCPs. Otherwise, you can:  Start on day 1 of your menstrual period. You will not need any backup contraceptive protection with this start time.  Start on the first Sunday after your menstrual period or the day you get your prescription. In these cases, you will need to use backup contraceptive protection for the first week.  Start the pill at any time of your cycle. If you take the pill within 5 days of the start of your period, you are protected against pregnancy right away. In this case, you will not need a backup form of birth control. If you start at any other time of your menstrual cycle, you will need to use another form of birth control for 7 days. If your OCP is the type called a minipill, it will protect you from pregnancy after taking it for 2 days (48  hours).  After you have started taking OCPs:  If you forget to take 1 pill, take it as soon as you remember. Take the next pill at the regular time.  If you miss 2 or more pills, call your health care provider because different pills have different instructions for missed doses. Use backup birth control until your next menstrual period starts.  If you use a 28-day pack that contains inactive pills and you miss 1 of the last 7 pills (pills with no hormones), it will not matter. Throw away the rest of the non-hormone pills and start a new pill pack.  No matter which day you start the OCP, you will always start a new pack on that same day of the week. Have an extra pack of OCPs and a backup contraceptive method available in case you miss some pills or lose your OCP pack. Follow these instructions at home:  Do not smoke.  Always use a condom to protect against STDs. OCPs do not protect against STDs.  Use a calendar to mark your menstrual period days.  Read the information and directions that came with your OCP. Talk to your health care provider if you have questions. Contact a health care provider if:  You develop nausea and vomiting.  You have abnormal vaginal discharge or bleeding.  You develop a rash.  You miss your menstrual period.  You are losing your hair.  You need treatment for mood swings or depression.  You   get dizzy when taking the OCP.  You develop acne from taking the OCP.  You become pregnant. Get help right away if:  You develop chest pain.  You develop shortness of breath.  You have an uncontrolled or severe headache.  You develop numbness or slurred speech.  You develop visual problems.  You develop pain, redness, and swelling in the legs. This information is not intended to replace advice given to you by your health care provider. Make sure you discuss any questions you have with your health care provider. Document Released: 12/02/2011 Document  Revised: 05/20/2016 Document Reviewed: 06/03/2013 Elsevier Interactive Patient Education  2017 Elsevier Inc.  

## 2017-04-04 NOTE — Progress Notes (Signed)
Encounter reviewed Jill Jertson, MD   

## 2017-05-11 ENCOUNTER — Telehealth: Payer: Self-pay | Admitting: Certified Nurse Midwife

## 2017-05-11 NOTE — Telephone Encounter (Signed)
Left message to call Mallissa Lorenzen at [phone].  

## 2017-05-11 NOTE — Telephone Encounter (Signed)
Patient would like to have another IUD inserted.

## 2017-05-16 NOTE — Telephone Encounter (Signed)
Spoke with patient. Patient states she is having trouble remembering to take OCP, has missed more than 3 pills. Skyla IUD removed 03/30/17. LMP "short one more than a month ago". Patient would like to have IUD placed, would like a different option other than Skyla. Patient reports negative UPT on 05/14/17. Patient reports consistent condom use. Advised patient to continue consistent condom use. Patient scheduled for OV to discuss IUD options on 5/23 at 8:15am with Dr. Edward JollySilva.   Routing to provider for final review. Patient is agreeable to disposition. Will close encounter.  Cc: Leota Sauerseborah Leonard, CNM, Dr. Edward JollySilva

## 2017-05-18 ENCOUNTER — Ambulatory Visit: Payer: Self-pay | Admitting: Obstetrics and Gynecology

## 2017-05-18 NOTE — Progress Notes (Deleted)
GYNECOLOGY  VISIT   HPI: 21 y.o.   Single  Caucasian  female   G0P0000 with No LMP recorded. Patient is not currently having periods (Reason: IUD).   here for   Discuss IUD options  GYNECOLOGIC HISTORY: No LMP recorded. Patient is not currently having periods (Reason: IUD). Contraception:  OCP  Menopausal hormone therapy:  n/a Last mammogram:  n/a Last pap smear:   n/a        OB History    Gravida Para Term Preterm AB Living   0 0 0 0 0 0   SAB TAB Ectopic Multiple Live Births   0 0 0 0           Patient Active Problem List   Diagnosis Date Noted  . Menstrual migraine 08/31/2013  . Menorrhagia 08/31/2013    Past Medical History:  Diagnosis Date  . Anxiety   . Depression   . Heart murmur   . Migraines    with aura    Past Surgical History:  Procedure Laterality Date  . BIOPSY THYROID     benign  . skyla     insertion 04-18-15    Current Outpatient Prescriptions  Medication Sig Dispense Refill  . buPROPion (WELLBUTRIN XL) 150 MG 24 hr tablet Take 1 tablet by mouth daily.    . busPIRone (BUSPAR) 5 MG tablet Take 1 tablet by mouth daily.    . norethindrone-ethinyl estradiol (JUNEL FE,GILDESS FE,LOESTRIN FE) 1-20 MG-MCG tablet Take 1 tablet by mouth daily. 1 Package 11   No current facility-administered medications for this visit.      ALLERGIES: Amoxicillin  Family History  Problem Relation Age of Onset  . Thyroid disease Mother        hypo  . Aneurysm Maternal Aunt   . Aneurysm Maternal Grandmother     Social History   Social History  . Marital status: Single    Spouse name: N/A  . Number of children: N/A  . Years of education: N/A   Occupational History  . Not on file.   Social History Main Topics  . Smoking status: Current Every Day Smoker    Types: E-cigarettes  . Smokeless tobacco: Never Used  . Alcohol use 1.2 oz/week    2 Standard drinks or equivalent per week  . Drug use: No  . Sexual activity: Yes    Partners: Male    Birth  control/ protection: IUD, Condom   Other Topics Concern  . Not on file   Social History Narrative  . No narrative on file    ROS:  Pertinent items are noted in HPI.  PHYSICAL EXAMINATION:    There were no vitals taken for this visit.    General appearance: alert, cooperative and appears stated age Head: Normocephalic, without obvious abnormality, atraumatic Neck: no adenopathy, supple, symmetrical, trachea midline and thyroid normal to inspection and palpation Lungs: clear to auscultation bilaterally Breasts: normal appearance, no masses or tenderness, No nipple retraction or dimpling, No nipple discharge or bleeding, No axillary or supraclavicular adenopathy Heart: regular rate and rhythm Abdomen: soft, non-tender, no masses,  no organomegaly Extremities: extremities normal, atraumatic, no cyanosis or edema Skin: Skin color, texture, turgor normal. No rashes or lesions Lymph nodes: Cervical, supraclavicular, and axillary nodes normal. No abnormal inguinal nodes palpated Neurologic: Grossly normal  Pelvic: External genitalia:  no lesions              Urethra:  normal appearing urethra with no masses, tenderness or lesions  Bartholins and Skenes: normal                 Vagina: normal appearing vagina with normal color and discharge, no lesions              Cervix: no lesions                Bimanual Exam:  Uterus:  normal size, contour, position, consistency, mobility, non-tender              Adnexa: no mass, fullness, tenderness              Rectal exam: {yes no:314532}.  Confirms.              Anus:  normal sphincter tone, no lesions  Chaperone was present for exam.  ASSESSMENT     PLAN     An After Visit Summary was printed and given to the patient.  ______ minutes face to face time of which over 50% was spent in counseling.

## 2017-05-25 ENCOUNTER — Encounter: Payer: Self-pay | Admitting: Obstetrics and Gynecology

## 2017-05-25 ENCOUNTER — Ambulatory Visit (INDEPENDENT_AMBULATORY_CARE_PROVIDER_SITE_OTHER): Payer: BLUE CROSS/BLUE SHIELD | Admitting: Obstetrics and Gynecology

## 2017-05-25 VITALS — BP 100/60 | HR 88 | Resp 16 | Wt 162.0 lb

## 2017-05-25 DIAGNOSIS — Z3009 Encounter for other general counseling and advice on contraception: Secondary | ICD-10-CM

## 2017-05-25 DIAGNOSIS — N926 Irregular menstruation, unspecified: Secondary | ICD-10-CM | POA: Diagnosis not present

## 2017-05-25 DIAGNOSIS — Z01812 Encounter for preprocedural laboratory examination: Secondary | ICD-10-CM | POA: Diagnosis not present

## 2017-05-25 LAB — POCT URINE PREGNANCY: Preg Test, Ur: NEGATIVE

## 2017-05-25 NOTE — Progress Notes (Signed)
GYNECOLOGY  VISIT   HPI: [age] y.o.   Single  Caucasian  female   G0P0000 with Patient's last menstrual period was 05/16/2017.   here for  Discuss IUD options.  Interested in CollbranParaGard IUD.   Hx Skyla IUD removal in March 30, 2017.  Had first menses which was "very average but late." Took about 6 weeks to have a cycle after IUD removal.  IUD causes hormonal changes, mood changes, and weight gain.   Nuva Ring fell out.  Difficulty to remember to take pills.  Stopped her OCPs.  Did a UPT at home and it was negative.  Sexually active 2 weeks ago.  Broke up since then and reunited also.  Had STD testing at March at school. Had negative GC/CT testing then.   GYNECOLOGIC HISTORY: Patient's last menstrual period was 05/16/2017. Contraception:  Patient has stopped taking OCP; per patient no currently sexually active Menopausal hormone therapy:  n/a Last mammogram:  n/a Last pap smear:   n/a        OB History    Gravida Para Term Preterm AB Living   0 0 0 0 0 0   SAB TAB Ectopic Multiple Live Births   0 0 0 0           Patient Active Problem List   Diagnosis Date Noted  . Menstrual migraine 08/31/2013  . Menorrhagia 08/31/2013    Past Medical History:  Diagnosis Date  . Anxiety   . Depression   . Heart murmur   . Migraines    with aura    Past Surgical History:  Procedure Laterality Date  . BIOPSY THYROID     benign  . skyla     insertion 04-18-15    Current Outpatient Prescriptions  Medication Sig Dispense Refill  . buPROPion (WELLBUTRIN XL) 150 MG 24 hr tablet Take 1 tablet by mouth daily.    . busPIRone (BUSPAR) 5 MG tablet Take 1 tablet by mouth daily.     No current facility-administered medications for this visit.      ALLERGIES: Amoxicillin  Family History  Problem Relation Age of Onset  . Thyroid disease Mother        hypo  . Aneurysm Maternal Aunt   . Aneurysm Maternal Grandmother     Social History   Social History  . Marital status:  Single    Spouse name: N/A  . Number of children: N/A  . Years of education: N/A   Occupational History  . Not on file.   Social History Main Topics  . Smoking status: Current Every Day Smoker    Types: E-cigarettes  . Smokeless tobacco: Never Used  . Alcohol use 1.2 oz/week    2 Standard drinks or equivalent per week  . Drug use: No  . Sexual activity: Not Currently    Partners: Male    Birth control/ protection: Condom   Other Topics Concern  . Not on file   Social History Narrative  . No narrative on file    ROS:  Pertinent items are noted in HPI.  PHYSICAL EXAMINATION:    BP 100/60 (BP Location: Right Arm, Patient Position: Sitting, Cuff Size: Normal)   Pulse 88   Resp 16   Wt 162 lb (73.5 kg)   LMP 05/16/2017   BMI 24.81 kg/m     General appearance: alert, cooperative and appears stated age   Pelvic: External genitalia:  no lesions  Urethra:  normal appearing urethra with no masses, tenderness or lesions              Bartholins and Skenes: normal                 Vagina: normal appearing vagina with normal color and discharge, no lesions              Cervix: no lesions                Bimanual Exam:  Uterus:  normal size, contour, position, consistency, mobility, non-tender              Adnexa: no mass, fullness, tenderness         Chaperone was present for exam.  ASSESSMENT  Desire for long acting contraception.  Irregular menses.  Delayed onset of cycle following IUD removal. Migraine headache with aura.  PLAN  Discussion of menstrual frequency.  UPT here now - negative. Discussed ParaGard IUD, condoms with spermicide, and diaphragm with spermicide - risks and benefits of each.  She knows that the ParaGard will not control frequency of menses nor flow or cramping. In fact, menstrual symptoms may be increased and she can take NSAIDs for this. Will precert ParaGard.    An After Visit Summary was printed and given to the  patient.  _15_____ minutes face to face time of which over 50% was spent in counseling.

## 2017-05-30 ENCOUNTER — Telehealth: Payer: Self-pay | Admitting: Certified Nurse Midwife

## 2017-05-30 NOTE — Telephone Encounter (Signed)
Call to patient to review benefit for ParaGuard insertion. Left voicemail to return call for benefit review and scheduling.

## 2017-06-01 NOTE — Telephone Encounter (Signed)
Patient had returned call, front office staff placed on hold.  When trying to retrieve the call, the call was disconnected.  I called back to patient at the primary phone number listed, there was no answer. I have left a voicemail message requesting a return call.

## 2017-06-01 NOTE — Telephone Encounter (Signed)
Patient returned call. Reviewed benefit information for a ParaGuard IUD insertion.  Patient understood information presented. Patient states her last monthly period was on 05/16/17. I have advised patient to call our office at the onset of her next cycle. Patient understood and is agreeable.   Routing to Leota Sauerseborah Leonard, CNM  cc: Triage Nurse

## 2017-06-13 ENCOUNTER — Telehealth: Payer: Self-pay | Admitting: *Deleted

## 2017-06-13 MED ORDER — MISOPROSTOL 200 MCG PO TABS: ORAL_TABLET | ORAL | 0 refills | Status: DC

## 2017-06-13 NOTE — Telephone Encounter (Signed)
Spoke with patient. Patient started her menses today and would like to schedule Paragard insertion. Appointment scheduled for 06/15/2017 at 3 pm with Dr.Silva. Pre procedure instructions given.  Motrin instructions given. Motrin=Advil=Ibuprofen, 800 mg one hour before appointment. Eat a meal and hydrate well before appointment. Cytotec instructions given. Cytotec 200 mcg place 1 tablet the night before the procedure and 1 tablet the morning of the procedure #2 0RF sent to pharmacy on file. Patient verbalizes understanding.  Routing to provider for final review. Patient agreeable to disposition. Will close encounter.

## 2017-06-13 NOTE — Telephone Encounter (Signed)
Patient calling wanting to schedule her IUD. She started her period today. She will not have her phone after -

## 2017-06-13 NOTE — Progress Notes (Signed)
GYNECOLOGY  VISIT   HPI: 21 y.o.   Single  Caucasian  female   G0P0000 with Patient's last menstrual period was 06/13/2017 (exact date).   here for   ParaGard IUD insertion.  Negative GC/CT testing at school in March.  No partner change since then.  No other partner involved.   Took Cytotec this am.  Took 600 mg ibuprofen this afternoon.  UPT negative today.   GYNECOLOGIC HISTORY: Patient's last menstrual period was 06/13/2017 (exact date). Contraception:  Abstinence  Menopausal hormone therapy:  none Last mammogram:  none Last pap smear:   None         OB History    Gravida Para Term Preterm AB Living   0 0 0 0 0 0   SAB TAB Ectopic Multiple Live Births   0 0 0 0           Patient Active Problem List   Diagnosis Date Noted  . Menstrual migraine 08/31/2013  . Menorrhagia 08/31/2013    Past Medical History:  Diagnosis Date  . Anxiety   . Depression   . Heart murmur   . Migraines    with aura    Past Surgical History:  Procedure Laterality Date  . BIOPSY THYROID     benign  . skyla     insertion 04-18-15    Current Outpatient Prescriptions  Medication Sig Dispense Refill  . buPROPion (WELLBUTRIN XL) 150 MG 24 hr tablet Take 1 tablet by mouth daily.    . busPIRone (BUSPAR) 5 MG tablet Take 1 tablet by mouth daily.     No current facility-administered medications for this visit.      ALLERGIES: Amoxicillin  Family History  Problem Relation Age of Onset  . Thyroid disease Mother        hypo  . Aneurysm Maternal Aunt   . Aneurysm Maternal Grandmother     Social History   Social History  . Marital status: Single    Spouse name: N/A  . Number of children: N/A  . Years of education: N/A   Occupational History  . Not on file.   Social History Main Topics  . Smoking status: Current Every Day Smoker    Types: E-cigarettes  . Smokeless tobacco: Never Used  . Alcohol use 1.2 oz/week    2 Standard drinks or equivalent per week  . Drug use: No   . Sexual activity: Not Currently    Partners: Male    Birth control/ protection: Condom   Other Topics Concern  . Not on file   Social History Narrative  . No narrative on file    ROS:  Pertinent items are noted in HPI.  PHYSICAL EXAMINATION:    BP 110/70 (BP Location: Right Arm, Patient Position: Sitting, Cuff Size: Normal)   Pulse 88   Resp 16   Ht 5' 7.75" (1.721 m)   Wt 166 lb (75.3 kg)   LMP 06/13/2017 (Exact Date)   BMI 25.43 kg/m     General appearance: alert, cooperative and appears stated age  Pelvic: External genitalia:  no lesions              Urethra:  normal appearing urethra with no masses, tenderness or lesions              Bartholins and Skenes: normal                 Vagina: normal appearing vagina with normal color and discharge, no lesions  Cervix: no lesions. Menstrual flow.                 Bimanual Exam:  Uterus:  normal size, contour, position, consistency, mobility, non-tender              Adnexa: no mass, fullness, tenderness             ParaGard IUD insertion. Lot number [id number], Exp June 2028. Consent for procedure.  Sterile prep with betadine.  Paracervical block with 10 cc 1% lidocaine.  Lot number [id number], Exp 02/21. Tenaculum to anterior cervical lip.  Uterus sounded to a little over 6 cm.  IUD placed without difficulty.  Strings trimmed to 2.5 cm and shown to patient.  Repeat BM exam, no change. Minimal EBL.  No complications.  IUD card to patient.  Patient had minor vagal response with nausea and dizziness after procedure.  BP 112/84  and pulse 78. Observed and given an addition 200 mg ibuprofen and heating pad.   Chaperone was present for exam.  ASSESSMENT  ParaGard IUD insertion.   PLAN  Instructions and precautions given.  Call for heavy bleeding, increasing abdominal pain, or fever. Ibuprofen 800 mg po q 8 hours prn. Aware that this provides immediate contraception.  Follow up in 5 weeks for recheck.     An After Visit Summary was printed and given to the patient.

## 2017-06-15 ENCOUNTER — Ambulatory Visit (INDEPENDENT_AMBULATORY_CARE_PROVIDER_SITE_OTHER): Payer: BLUE CROSS/BLUE SHIELD | Admitting: Obstetrics and Gynecology

## 2017-06-15 VITALS — BP 110/70 | HR 88 | Resp 16 | Ht 67.75 in | Wt 166.0 lb

## 2017-06-15 DIAGNOSIS — Z3009 Encounter for other general counseling and advice on contraception: Secondary | ICD-10-CM

## 2017-06-15 DIAGNOSIS — Z30430 Encounter for insertion of intrauterine contraceptive device: Secondary | ICD-10-CM | POA: Diagnosis not present

## 2017-06-15 LAB — POCT URINE PREGNANCY: Preg Test, Ur: NEGATIVE

## 2017-06-16 ENCOUNTER — Telehealth: Payer: Self-pay | Admitting: Obstetrics and Gynecology

## 2017-06-16 NOTE — Telephone Encounter (Signed)
Please contact patient to see how she is doing post IUD insertion on 06/15/17.  She received a ParaGard and had a minor vagal response and cramping.   She needs to schedule her 5 week post IUD check with me also.

## 2017-06-16 NOTE — Telephone Encounter (Signed)
Left message to call Amanda Fowler at [phone]. 

## 2017-06-20 NOTE — Telephone Encounter (Signed)
Spoke with patient. Patient states that she is having light spotting and mild cramping following IUD insertion on 06/15/2017. Denies any heavy bleeding or pain. Advised she may take 600-800 mg of Ibuprofen/Motrin every 6-8 hours for cramping. Advised light spotting and mild cramping are common with new IUD insertion. Advised this should subside. If she develops any heavy bleeding of sharp pain will need to be seen for further evaluation. Patient is agreeable. 5 week recheck scheduled for 07/11/2017 at 2 pm with Dr.Silva. Patient is agreeable to date and time.  Routing to provider for final review. Patient agreeable to disposition. Will close encounter.

## 2017-06-30 ENCOUNTER — Ambulatory Visit: Payer: BLUE CROSS/BLUE SHIELD | Admitting: Certified Nurse Midwife

## 2017-07-11 ENCOUNTER — Ambulatory Visit (INDEPENDENT_AMBULATORY_CARE_PROVIDER_SITE_OTHER): Payer: BLUE CROSS/BLUE SHIELD | Admitting: Obstetrics and Gynecology

## 2017-07-11 ENCOUNTER — Encounter: Payer: Self-pay | Admitting: Obstetrics and Gynecology

## 2017-07-11 VITALS — BP 102/60 | HR 88 | Resp 16 | Wt 165.0 lb

## 2017-07-11 DIAGNOSIS — R102 Pelvic and perineal pain: Secondary | ICD-10-CM

## 2017-07-11 DIAGNOSIS — Z30431 Encounter for routine checking of intrauterine contraceptive device: Secondary | ICD-10-CM

## 2017-07-11 NOTE — Progress Notes (Signed)
Patient scheduled while in office. [facility] with [facility] at [facility]. Patient scheduled for PUS on 07/12/17 at 8 am, arriving at 7:40am. [address] location. Patient to arrive with full bladder, drink only water 1 hour prior to appointment. Patient verbalizes understanding and is agreeable.

## 2017-07-11 NOTE — Progress Notes (Signed)
GYNECOLOGY  VISIT   HPI: 21 y.o.   Single  Caucasian  female   G0P0000 with Patient's last menstrual period was 07/11/2017.   here for IUD recheck; patient complains of having some cramping daily since insertion of IUD     Cramping since the IUD was placed.   Taking Advil and Iburofen for the pain.   Able to sleep but appetite is decreased.   Feels flushed.  No know fever.  Sexually active.  No change in partner. Feels different but not painful to have sex.  Had negative GC/CT in March at school.   Previously had a Skyla IUD.   GYNECOLOGIC HISTORY: Patient's last menstrual period was 07/11/2017. Contraception:  ParaGard IUD inserted 06/15/17 Menopausal hormone therapy:  n/a Last mammogram:  n/a Last pap smear:   n/a        OB History    Gravida Para Term Preterm AB Living   0 0 0 0 0 0   SAB TAB Ectopic Multiple Live Births   0 0 0 0           Patient Active Problem List   Diagnosis Date Noted  . Menstrual migraine 08/31/2013  . Menorrhagia 08/31/2013    Past Medical History:  Diagnosis Date  . Anxiety   . Depression   . Heart murmur   . Migraines    with aura    Past Surgical History:  Procedure Laterality Date  . BIOPSY THYROID     benign  . skyla     insertion 04-18-15    Current Outpatient Prescriptions  Medication Sig Dispense Refill  . buPROPion (WELLBUTRIN XL) 150 MG 24 hr tablet Take 1 tablet by mouth daily.    . busPIRone (BUSPAR) 5 MG tablet Take 1 tablet by mouth daily.     No current facility-administered medications for this visit.      ALLERGIES: Amoxicillin  Family History  Problem Relation Age of Onset  . Thyroid disease Mother        hypo  . Aneurysm Maternal Aunt   . Aneurysm Maternal Grandmother     Social History   Social History  . Marital status: Single    Spouse name: N/A  . Number of children: N/A  . Years of education: N/A   Occupational History  . Not on file.   Social History Main Topics  . Smoking  status: Current Every Day Smoker    Types: E-cigarettes  . Smokeless tobacco: Never Used  . Alcohol use 1.2 oz/week    2 Standard drinks or equivalent per week  . Drug use: No  . Sexual activity: Not Currently    Partners: Male    Birth control/ protection: IUD     Comment: ParaGard - inserted 06/15/17   Other Topics Concern  . Not on file   Social History Narrative  . No narrative on file    ROS:  Pertinent items are noted in HPI.  PHYSICAL EXAMINATION:    BP 102/60 (BP Location: Right Arm, Patient Position: Sitting, Cuff Size: Normal)   Pulse 88   Resp 16   Wt 165 lb (74.8 kg)   LMP 07/11/2017   BMI 25.27 kg/m     General appearance: alert, cooperative and appears stated age   Abdomen: soft, non-tender, no masses,  no organomegaly   Pelvic: External genitalia:  no lesions              Urethra:  normal appearing urethra with no masses,  tenderness or lesions              Bartholins and Skenes: normal                 Vagina: normal appearing vagina with normal color and discharge, no lesions              Cervix: no lesions. IUD strings seen.  No CMT.                Bimanual Exam:  Uterus:  normal size, contour, position, consistency, mobility, non-tender              Adnexa: no mass, fullness, tenderness               Chaperone was present for exam.  ASSESSMENT  Status post Skyla removal and placement of ParaGard IUD. Pelvic pain/cramping.   PLAN  Possibilities of etiology of pain - malplacement of the IUD, removal of progesterone IUD and return to ovulatory function.  Will get pelvic ultrasound and manage as appropriate.  Patient really wants to know as soon as possible if the IUD positioning is OK.   An After Visit Summary was printed and given to the patient.  ___15___ minutes face to face time of which over 50% was spent in counseling.

## 2017-07-12 ENCOUNTER — Ambulatory Visit
Admission: RE | Admit: 2017-07-12 | Discharge: 2017-07-12 | Disposition: A | Discharge status: Home / Self Care | Payer: Self-pay | Source: Ambulatory Visit | Attending: Obstetrics and Gynecology | Admitting: Obstetrics and Gynecology

## 2017-07-12 ENCOUNTER — Telehealth: Payer: Self-pay | Admitting: *Deleted

## 2017-07-12 DIAGNOSIS — R102 Pelvic and perineal pain: Secondary | ICD-10-CM | POA: Diagnosis not present

## 2017-07-12 NOTE — Telephone Encounter (Signed)
Left message to call Resa Rinks at [phone].  

## 2017-07-12 NOTE — Telephone Encounter (Signed)
Spoke with patient, advised of results and recommendations as seen below per Dr. Edward JollySilva. Patient scheduled for OV on 7/18 at 10am with Dr. Edward JollySilva. Patient verbalizes understanding and is agreeable.  Patient is agreeable to disposition. Will close encounter.

## 2017-07-12 NOTE — Telephone Encounter (Signed)
-----   Message from Patton SallesBrook E Amundson C Silva, MD sent at 07/12/2017 12:44 PM EDT ----- Please let patient know that her IUD is in a normal position.  Her ovaries are normal as well.  Please make an appointment for patient to see me in follow up this week.  I can see her 7/18 or 7/19.

## 2017-07-12 NOTE — Telephone Encounter (Signed)
Spoke with Tobi Bastosnna from [facility] calling to notify report from PUS dated today available for review in EPIC. RN advised will update provider for review.    Routing to covering provider for review.  Dr. Oscar LaJertson, please review PUS dated today.  Cc: Dr. Edward JollySilva

## 2017-07-13 ENCOUNTER — Encounter: Payer: Self-pay | Admitting: Obstetrics and Gynecology

## 2017-07-13 ENCOUNTER — Ambulatory Visit (INDEPENDENT_AMBULATORY_CARE_PROVIDER_SITE_OTHER): Payer: BLUE CROSS/BLUE SHIELD | Admitting: Obstetrics and Gynecology

## 2017-07-13 VITALS — BP 110/70 | HR 68 | Resp 16 | Wt 165.0 lb

## 2017-07-13 DIAGNOSIS — Z113 Encounter for screening for infections with a predominantly sexual mode of transmission: Secondary | ICD-10-CM

## 2017-07-13 DIAGNOSIS — R102 Pelvic and perineal pain: Secondary | ICD-10-CM

## 2017-07-13 NOTE — Progress Notes (Signed)
GYNECOLOGY  VISIT   HPI: 21 y.o.   Single  Caucasian  female   G0P0000 with Patient's last menstrual period was 07/11/2017.   here for f/u from PUS.  US done for pelvic cramping since IUD was placed.  Pelvic US shows IUD is in the endometrial canal.   Negative GC/CT this spring.   Still bleeding with her period.  States the cramping got worse with onset of her cycle.  Denies dysuria.   No bowel problems.  BMs are once to twice a day.  No diarrhea or blood in the stool.  No intercourse since the IUD was placed.     Really likes that the IUD is nonhormonal.   GYNECOLOGIC HISTORY: Patient's last menstrual period was 07/11/2017. Contraception:  ParaGard IUD inserted 06/15/17 Menopausal hormone therapy:  n/a Last mammogram:  n/a Last pap smear:   n/a        OB History    Gravida Para Term Preterm AB Living   0 0 0 0 0 0   SAB TAB Ectopic Multiple Live Births   0 0 0 0           Patient Active Problem List   Diagnosis Date Noted  . Menstrual migraine 08/31/2013  . Menorrhagia 08/31/2013    Past Medical History:  Diagnosis Date  . Anxiety   . Depression   . Heart murmur   . Migraines    with aura    Past Surgical History:  Procedure Laterality Date  . BIOPSY THYROID     benign  . skyla     insertion 04-18-15    Current Outpatient Prescriptions  Medication Sig Dispense Refill  . buPROPion (WELLBUTRIN XL) 150 MG 24 hr tablet Take 1 tablet by mouth daily.    . busPIRone (BUSPAR) 5 MG tablet Take 1 tablet by mouth daily.     No current facility-administered medications for this visit.      ALLERGIES: Amoxicillin  Family History  Problem Relation Age of Onset  . Thyroid disease Mother        hypo  . Aneurysm Maternal Aunt   . Aneurysm Maternal Grandmother     Social History   Social History  . Marital status: Single    Spouse name: N/A  . Number of children: N/A  . Years of education: N/A   Occupational History  . Not on file.   Social  History Main Topics  . Smoking status: Current Every Day Smoker    Types: E-cigarettes  . Smokeless tobacco: Never Used  . Alcohol use 1.2 oz/week    2 Standard drinks or equivalent per week  . Drug use: No  . Sexual activity: Not Currently    Partners: Male    Birth control/ protection: IUD     Comment: ParaGard - inserted 06/15/17   Other Topics Concern  . Not on file   Social History Narrative  . No narrative on file    ROS:  Pertinent items are noted in HPI.  PHYSICAL EXAMINATION:    BP 110/70 (BP Location: Right Arm, Patient Position: Sitting, Cuff Size: Normal)   Pulse 68   Resp 16   Wt 165 lb (74.8 kg)   LMP 07/11/2017   BMI 25.27 kg/m     General appearance: alert, cooperative and appears stated age    Pelvic: External genitalia:  no lesions              Urethra:  normal appearing urethra with no  masses, tenderness or lesions              Bartholins and Skenes: normal                 Vagina: normal appearing vagina with normal color and discharge, no lesions              Cervix: no lesions.  IUD strings seen.  Old blood noted.  No CMT.                 Bimanual Exam:  Uterus:  normal size, contour, position, consistency, mobility, non-tender              Adnexa: no mass, fullness, tenderness              Chaperone was present for exam.  ASSESSMENT  Pelvic cramping.  ParaGard IUD.  STD screening.   PLAN  Will check Affirm and GC/CT.  May do an empiric course of doxycycline.  Patient would like to continue with he IUD.  NSAIDs prn.   An After Visit Summary was printed and given to the patient.  __15___ minutes face to face time of which over 50% was spent in counseling.

## 2017-07-13 NOTE — Patient Instructions (Signed)
Please call if you pain worsens or if you get a fever!

## 2017-07-14 LAB — VAGINITIS/VAGINOSIS, DNA PROBE
Candida Species: NEGATIVE
Gardnerella vaginalis: NEGATIVE
Trichomonas vaginosis: NEGATIVE

## 2017-07-15 LAB — GC/CHLAMYDIA PROBE AMP
Chlamydia trachomatis, NAA: NEGATIVE
Neisseria gonorrhoeae by PCR: NEGATIVE

## 2017-07-18 ENCOUNTER — Telehealth: Payer: Self-pay

## 2017-07-18 NOTE — Telephone Encounter (Signed)
-----   Message from Patton SallesBrook E Amundson C Silva, MD sent at 07/16/2017  9:55 AM EDT ----- Please inform patient of her negative Affirm and negative GC/CT.  I am recommending an empiric course of Doxycycline as she has had cramping since her IUD was placed.  Her ultrasound showed the IUD to be in a proper position and showed no signs of pathology.  She and I talked about this plan at her office visit.  Doxycycline 100 mg po bid x 1 week.  Take with food. Please send to pharmacy of choice. She will need follow up about a week after she completes the abx. Please schedule.

## 2017-07-18 NOTE — Telephone Encounter (Signed)
Left message to call Kaitlyn at [phone]. 

## 2017-07-20 NOTE — Telephone Encounter (Signed)
Left message to call Kaitlyn at [phone]. 

## 2017-07-22 NOTE — Telephone Encounter (Signed)
Left message to call Kaitlyn at [phone]. 

## 2017-07-22 NOTE — Telephone Encounter (Signed)
Patient returned call

## 2017-08-02 NOTE — Telephone Encounter (Signed)
Follow-up call to patient from call on 07-25-17 Patient previously spoke to Amanda MuKaitlyn Sprague, RN on 07-25-17 who advised patient of results and instructions from Dr Edward JollySilva. Due to sudden bereavement leave, Amanda Fowler was unable to complete call documentation and prescription.   Patient states she does not feel she needs medication sent as cramping has now resolved and she is pleased with IUD. Has minimal bleeding and does not feel like she needs any further follow-up. Has annual scheduled for 03-22-18.    Advised Dr Edward JollySilva will review call. Will call back if additional instructions.

## 2017-08-03 NOTE — Telephone Encounter (Signed)
No further instructions.  Encounter has already been closed.

## 2017-08-17 DIAGNOSIS — N3081 Other cystitis with hematuria: Secondary | ICD-10-CM | POA: Diagnosis not present

## 2017-09-27 DIAGNOSIS — J029 Acute pharyngitis, unspecified: Secondary | ICD-10-CM | POA: Diagnosis not present

## 2017-09-27 DIAGNOSIS — R300 Dysuria: Secondary | ICD-10-CM | POA: Diagnosis not present

## 2017-11-04 DIAGNOSIS — F900 Attention-deficit hyperactivity disorder, predominantly inattentive type: Secondary | ICD-10-CM | POA: Diagnosis not present

## 2017-11-29 DIAGNOSIS — F900 Attention-deficit hyperactivity disorder, predominantly inattentive type: Secondary | ICD-10-CM | POA: Diagnosis not present

## 2017-12-07 DIAGNOSIS — F900 Attention-deficit hyperactivity disorder, predominantly inattentive type: Secondary | ICD-10-CM | POA: Diagnosis not present

## 2018-01-25 DIAGNOSIS — F988 Other specified behavioral and emotional disorders with onset usually occurring in childhood and adolescence: Secondary | ICD-10-CM | POA: Diagnosis not present

## 2018-01-25 DIAGNOSIS — F411 Generalized anxiety disorder: Secondary | ICD-10-CM | POA: Diagnosis not present

## 2018-01-25 DIAGNOSIS — F3341 Major depressive disorder, recurrent, in partial remission: Secondary | ICD-10-CM | POA: Diagnosis not present

## 2018-01-25 DIAGNOSIS — F410 Panic disorder [episodic paroxysmal anxiety] without agoraphobia: Secondary | ICD-10-CM | POA: Diagnosis not present

## 2018-03-22 ENCOUNTER — Ambulatory Visit: Payer: BLUE CROSS/BLUE SHIELD | Admitting: Certified Nurse Midwife

## 2018-03-22 ENCOUNTER — Encounter: Payer: Self-pay | Admitting: Certified Nurse Midwife

## 2018-03-22 NOTE — Progress Notes (Deleted)
22 y.o. G0P0000 Single  {Race/ethnicity:17218} Fe here for annual exam.    No LMP recorded.          Sexually active: {yes no:314532}  The current method of family planning is {contraception:315051}.    Exercising: {yes no:314532}  {types:19826} Smoker:  {YES NO:22349}  Health Maintenance: Pap:  none History of Abnormal Pap: {YES NO:22349} MMG:  none Self Breast exams: {YES NO:22349} Colonoscopy:  none BMD:   none TDaP:  Less than 10 Shingles: no Pneumonia: no Hep C and HIV: HIV neg 2016, hep c neg 2015 Labs: ***   reports that she has been smoking e-cigarettes.  She has never used smokeless tobacco. She reports that she drinks about 1.2 oz of alcohol per week. She reports that she does not use drugs.  Past Medical History:  Diagnosis Date  . Anxiety   . Depression   . Heart murmur   . Migraines    with aura    Past Surgical History:  Procedure Laterality Date  . BIOPSY THYROID     benign  . skyla     insertion 04-18-15    Current Outpatient Medications  Medication Sig Dispense Refill  . buPROPion (WELLBUTRIN XL) 150 MG 24 hr tablet Take 1 tablet by mouth daily.    . busPIRone (BUSPAR) 5 MG tablet Take 1 tablet by mouth daily.     No current facility-administered medications for this visit.     Family History  Problem Relation Age of Onset  . Thyroid disease Mother        hypo  . Aneurysm Maternal Aunt   . Aneurysm Maternal Grandmother     ROS:  Pertinent items are noted in HPI.  Otherwise, a comprehensive ROS was negative.  Exam:   There were no vitals taken for this visit.   Ht Readings from Last 3 Encounters:  06/15/17 5' 7.75" (1.721 m) (91 %, Z= 1.36)*  03/30/17 5' 7.75" (1.721 m) (91 %, Z= 1.36)*  03/17/17 5' 7.75" (1.721 m) (91 %, Z= 1.36)*   * Growth percentiles are based on CDC (Girls, 2-20 Years) data.    General appearance: alert, cooperative and appears stated age Head: Normocephalic, without obvious abnormality, atraumatic Neck: no  adenopathy, supple, symmetrical, trachea midline and thyroid {EXAM; THYROID:18604} Lungs: clear to auscultation bilaterally Breasts: {Exam; breast:13139::"normal appearance, no masses or tenderness"} Heart: regular rate and rhythm Abdomen: soft, non-tender; no masses,  no organomegaly Extremities: extremities normal, atraumatic, no cyanosis or edema Skin: Skin color, texture, turgor normal. No rashes or lesions Lymph nodes: Cervical, supraclavicular, and axillary nodes normal. No abnormal inguinal nodes palpated Neurologic: Grossly normal   Pelvic: External genitalia:  no lesions              Urethra:  normal appearing urethra with no masses, tenderness or lesions              Bartholin's and Skene's: normal                 Vagina: normal appearing vagina with normal color and discharge, no lesions              Cervix: {exam; cervix:14595}              Pap taken: {yes no:314532} Bimanual Exam:  Uterus:  {exam; uterus:12215}              Adnexa: {exam; adnexa:12223}               Rectovaginal: Confirms  Anus:  normal sphincter tone, no lesions  Chaperone present: ***  A:  Well Woman with normal exam  P:   Reviewed health and wellness pertinent to exam  Pap smear: {YES NO:22349}  {plan; gyn:5269::"mammogram","pap smear","return annually or prn"}  An After Visit Summary was printed and given to the patient.

## 2018-05-08 DIAGNOSIS — N390 Urinary tract infection, site not specified: Secondary | ICD-10-CM | POA: Diagnosis not present

## 2018-05-08 DIAGNOSIS — Z3202 Encounter for pregnancy test, result negative: Secondary | ICD-10-CM | POA: Diagnosis not present

## 2018-05-11 ENCOUNTER — Other Ambulatory Visit: Payer: Self-pay

## 2018-05-11 ENCOUNTER — Encounter: Payer: Self-pay | Admitting: Obstetrics and Gynecology

## 2018-05-11 ENCOUNTER — Ambulatory Visit (INDEPENDENT_AMBULATORY_CARE_PROVIDER_SITE_OTHER): Payer: BLUE CROSS/BLUE SHIELD | Admitting: Obstetrics and Gynecology

## 2018-05-11 VITALS — BP 118/60 | HR 92 | Resp 14 | Ht 68.0 in | Wt 158.0 lb

## 2018-05-11 DIAGNOSIS — Z30431 Encounter for routine checking of intrauterine contraceptive device: Secondary | ICD-10-CM

## 2018-05-11 DIAGNOSIS — Z01419 Encounter for gynecological examination (general) (routine) without abnormal findings: Secondary | ICD-10-CM | POA: Diagnosis not present

## 2018-05-11 DIAGNOSIS — N6324 Unspecified lump in the left breast, lower inner quadrant: Secondary | ICD-10-CM

## 2018-05-11 DIAGNOSIS — N390 Urinary tract infection, site not specified: Secondary | ICD-10-CM

## 2018-05-11 DIAGNOSIS — R300 Dysuria: Secondary | ICD-10-CM | POA: Diagnosis not present

## 2018-05-11 DIAGNOSIS — R350 Frequency of micturition: Secondary | ICD-10-CM

## 2018-05-11 DIAGNOSIS — Z0000 Encounter for general adult medical examination without abnormal findings: Secondary | ICD-10-CM

## 2018-05-11 DIAGNOSIS — Z113 Encounter for screening for infections with a predominantly sexual mode of transmission: Secondary | ICD-10-CM | POA: Diagnosis not present

## 2018-05-11 LAB — POCT URINALYSIS DIPSTICK
Bilirubin, UA: NEGATIVE
Blood, UA: NEGATIVE
Glucose, UA: NEGATIVE
Ketones, UA: NEGATIVE
Leukocytes, UA: NEGATIVE
Nitrite, UA: NEGATIVE
Protein, UA: NEGATIVE
pH, UA: 7 (ref 5.0–8.0)

## 2018-05-11 MED ORDER — NITROFURANTOIN MACROCRYSTAL 50 MG PO CAPS: ORAL_CAPSULE | ORAL | 2 refills | Status: DC

## 2018-05-11 MED ORDER — NITROFURANTOIN MONOHYD MACRO 100 MG PO CAPS: 100.0000 mg | ORAL_CAPSULE | Freq: Two times a day (BID) | ORAL | 0 refills | Status: DC

## 2018-05-11 NOTE — Patient Instructions (Signed)
EXERCISE AND DIET:  We recommended that you start or continue a regular exercise program for good health. Regular exercise means any activity that makes your heart beat faster and makes you sweat.  We recommend exercising at least 30 minutes per day at least 3 days a week, preferably 4 or 5.  We also recommend a diet low in fat and sugar.  Inactivity, poor dietary choices and obesity can cause diabetes, heart attack, stroke, and kidney damage, among others.    ALCOHOL AND SMOKING:  Women should limit their alcohol intake to no more than 7 drinks/beers/glasses of wine (combined, not each!) per week. Moderation of alcohol intake to this level decreases your risk of breast cancer and liver damage. And of course, no recreational drugs are part of a healthy lifestyle.  And absolutely no smoking or even second hand smoke. Most people know smoking can cause heart and lung diseases, but did you know it also contributes to weakening of your bones? Aging of your skin?  Yellowing of your teeth and nails?  CALCIUM AND VITAMIN D:  Adequate intake of calcium and Vitamin D are recommended.  The recommendations for exact amounts of these supplements seem to change often, but generally speaking 600 mg of calcium (either carbonate or citrate) and 800 units of Vitamin D per day seems prudent. Certain women may benefit from higher intake of Vitamin D.  If you are among these women, your doctor will have told you during your visit.    PAP SMEARS:  Pap smears, to check for cervical cancer or precancers,  have traditionally been done yearly, although recent scientific advances have shown that most women can have pap smears less often.  However, every woman still should have a physical exam from her gynecologist every year. It will include a breast check, inspection of the vulva and vagina to check for abnormal growths or skin changes, a visual exam of the cervix, and then an exam to evaluate the size and shape of the uterus and  ovaries.  And after 21 years of age, a rectal exam is indicated to check for rectal cancers. We will also provide age appropriate advice regarding health maintenance, like when you should have certain vaccines, screening for sexually transmitted diseases, bone density testing, colonoscopy, mammograms, etc.   MAMMOGRAMS:  All women over 40 years old should have a yearly mammogram. Many facilities now offer a "3D" mammogram, which may cost around $50 extra out of pocket. If possible,  we recommend you accept the option to have the 3D mammogram performed.  It both reduces the number of women who will be called back for extra views which then turn out to be normal, and it is better than the routine mammogram at detecting truly abnormal areas.    COLONOSCOPY:  Colonoscopy to screen for colon cancer is recommended for all women at age 50.  We know, you hate the idea of the prep.  We agree, BUT, having colon cancer and not knowing it is worse!!  Colon cancer so often starts as a polyp that can be seen and removed at colonscopy, which can quite literally save your life!  And if your first colonoscopy is normal and you have no family history of colon cancer, most women don't have to have it again for 10 years.  Once every ten years, you can do something that may end up saving your life, right?  We will be happy to help you get it scheduled when you are ready.    Be sure to check your insurance coverage so you understand how much it will cost.  It may be covered as a preventative service at no cost, but you should check your particular policy.      Breast Self-Awareness Breast self-awareness means being familiar with how your breasts look and feel. It involves checking your breasts regularly and reporting any changes to your health care provider. Practicing breast self-awareness is important. A change in your breasts can be a sign of a serious medical problem. Being familiar with how your breasts look and feel allows  you to find any problems early, when treatment is more likely to be successful. All women should practice breast self-awareness, including women who have had breast implants. How to do a breast self-exam One way to learn what is normal for your breasts and whether your breasts are changing is to do a breast self-exam. To do a breast self-exam: Look for Changes  1. Remove all the clothing above your waist. 2. Stand in front of a mirror in a room with good lighting. 3. Put your hands on your hips. 4. Push your hands firmly downward. 5. Compare your breasts in the mirror. Look for differences between them (asymmetry), such as: ? Differences in shape. ? Differences in size. ? Puckers, dips, and bumps in one breast and not the other. 6. Look at each breast for changes in your skin, such as: ? Redness. ? Scaly areas. 7. Look for changes in your nipples, such as: ? Discharge. ? Bleeding. ? Dimpling. ? Redness. ? A change in position. Feel for Changes  Carefully feel your breasts for lumps and changes. It is best to do this while lying on your back on the floor and again while sitting or standing in the shower or tub with soapy water on your skin. Feel each breast in the following way:  Place the arm on the side of the breast you are examining above your head.  Feel your breast with the other hand.  Start in the nipple area and make  inch (2 cm) overlapping circles to feel your breast. Use the pads of your three middle fingers to do this. Apply light pressure, then medium pressure, then firm pressure. The light pressure will allow you to feel the tissue closest to the skin. The medium pressure will allow you to feel the tissue that is a little deeper. The firm pressure will allow you to feel the tissue close to the ribs.  Continue the overlapping circles, moving downward over the breast until you feel your ribs below your breast.  Move one finger-width toward the center of the body.  Continue to use the  inch (2 cm) overlapping circles to feel your breast as you move slowly up toward your collarbone.  Continue the up and down exam using all three pressures until you reach your armpit.  Write Down What You Find  Write down what is normal for each breast and any changes that you find. Keep a written record with breast changes or normal findings for each breast. By writing this information down, you do not need to depend only on memory for size, tenderness, or location. Write down where you are in your menstrual cycle, if you are still menstruating. If you are having trouble noticing differences in your breasts, do not get discouraged. With time you will become more familiar with the variations in your breasts and more comfortable with the exam. How often should I examine my breasts? Examine   your breasts every month. If you are breastfeeding, the best time to examine your breasts is after a feeding or after using a breast pump. If you menstruate, the best time to examine your breasts is 5-7 days after your period is over. During your period, your breasts are lumpier, and it may be more difficult to notice changes. When should I see my health care provider? See your health care provider if you notice:  A change in shape or size of your breasts or nipples.  A change in the skin of your breast or nipples, such as a reddened or scaly area.  Unusual discharge from your nipples.  A lump or thick area that was not there before.  Pain in your breasts.  Anything that concerns you.  This information is not intended to replace advice given to you by your health care provider. Make sure you discuss any questions you have with your health care provider. Document Released: 12/13/2005 Document Revised: 05/20/2016 Document Reviewed: 11/02/2015 Elsevier Interactive Patient Education  2018 Elsevier Inc.  

## 2018-05-11 NOTE — Progress Notes (Signed)
22 y.o. G0P0000 SingleCaucasianF here for annual exam. Patient is on Bactrim for 3 days for a UTI, still having symptoms.  Still has frequency and slight dysuria. She was started on antibiotics from urgent care. She was referred to Urology for recurrent UTI's. At least 5 in the last year, she was seen for all them by her primary or urgent care. All seem related to intercourse.  She has a paragard IUD, placed in 6/18. That was removed and the mirena was placed in 2/19.  She is sexually active, same partner since August of 2018. Not using condoms. She reports intermittent deep dyspareunia.  Period Cycle (Days): 28 Period Duration (Days): 5-6 days  Period Pattern: Regular Menstrual Flow: Moderate Menstrual Control: Tampon Menstrual Control Change Freq (Hours): changes tampon every 5 hours  Dysmenorrhea: None  Patient's last menstrual period was 05/07/2018.          Sexually active: Yes.    The current method of family planning is IUD.   Mirena 2/19 Exercising: No.  The patient does not participate in regular exercise at present. Smoker:  Yes E-CIG  Health Maintenance: Pap:  N/A TDaP:  unsure Gardasil: yes x 3   reports that she has been smoking e-cigarettes.  She has never used smokeless tobacco. She reports that she drinks about 0.6 oz of alcohol per week. She reports that she does not use drugs. She goes to [employer], Arts development officer. She has dual citizenship here and [place]. Wants to get her masters over there.   Past Medical History:  Diagnosis Date  . ADHD   . Anxiety   . Depression   . Heart murmur   . Migraines    with aura    Past Surgical History:  Procedure Laterality Date  . BIOPSY THYROID     benign    Current Outpatient Medications  Medication Sig Dispense Refill  . amphetamine-dextroamphetamine (ADDERALL) 10 MG tablet Take 10 mg by mouth daily with breakfast.    . FLUoxetine (PROZAC) 20 MG capsule Take by mouth daily.  1  . propranolol (INDERAL) 10 MG tablet  Take 10 mg by mouth 3 (three) times daily.    Marland Kitchen sulfamethoxazole-trimethoprim (BACTRIM DS,SEPTRA DS) 800-160 MG tablet TAKE 1 TABLET BY MOUTH TWICE A DAY FOR 3 DAYS  0   No current facility-administered medications for this visit.     Family History  Problem Relation Age of Onset  . Thyroid disease Mother        hypo  . Aneurysm Maternal Aunt   . Aneurysm Maternal Grandmother     Review of Systems  Constitutional: Negative.   HENT: Negative.   Eyes: Negative.   Respiratory: Negative.   Cardiovascular: Negative.   Gastrointestinal: Negative.   Endocrine: Negative.   Genitourinary: Positive for dyspareunia, dysuria and frequency.  Musculoskeletal: Negative.   Skin: Negative.   Allergic/Immunologic: Negative.   Neurological: Negative.   Psychiatric/Behavioral: Negative.     Exam:   BP 118/60 (BP Location: Right Arm, Patient Position: Sitting, Cuff Size: Normal)   Pulse 92   Resp 14   Ht  (1.727 m)   Wt 158 lb (71.7 kg)   LMP 05/07/2018   BMI 24.02 kg/m   Weight change: @ Height:   Height:  (172.7 cm)  Ht Readings from Last 3 Encounters:  05/11/18  (1.727 m)  06/15/17 5' 7.75" (1.721 m) (91 %, Z= 1.36)*  03/30/17 5' 7.75" (1.721 m) (91 %, Z= 1.36)*   * Growth  percentiles are based on CDC (Girls, 2-20 Years) data.    General appearance: alert, cooperative and appears stated age Head: Normocephalic, without obvious abnormality, atraumatic Neck: no adenopathy, supple, symmetrical, trachea midline and thyroid normal to inspection and palpation Lungs: clear to auscultation bilaterally Cardiovascular: regular rate and rhythm Breasts: In the left breast at 8 o'clock, just outside the areolar region is a smooth, mobile, non tender lima bean shaped lump. No other lumps, no dimpling or skin changes Abdomen: soft, non-tender; non distended,  no masses,  no organomegaly Extremities: extremities normal, atraumatic, no cyanosis or edema Skin: Skin  color, texture, turgor normal. No rashes or lesions Lymph nodes: Cervical, supraclavicular, and axillary nodes normal. No abnormal inguinal nodes palpated Neurologic: Grossly normal   Pelvic: External genitalia:  no lesions              Urethra:  normal appearing urethra with no masses, tenderness or lesions              Bartholins and Skenes: normal                 Vagina: normal appearing vagina with normal color and discharge, no lesions              Cervix: no lesions and IUD string 1 cm               Bimanual Exam:  Uterus:  normal size, contour, position, consistency, mobility, non-tender and retroverted              Adnexa: no mass, fullness, tenderness               Rectovaginal: Confirms               Anus:  normal sphincter tone, no lesions  Chaperone was present for exam.  A:  Well Woman with normal exam  Recurrent UTI, with intercourse  Currently on medication for UTI, not better  New breast lump  IUD check  P:   No pap until next year  Screening labs  Urine for ua, c&s  STD testing  Discussed breast self exam  Discussed calcium and vit D intake  Stop Bactrim, start macrobid  macrodantin for prn use with intercourse

## 2018-05-11 NOTE — Progress Notes (Signed)
Scheduled patient while in office for [facility] 05[facility]17/2018 at 7:20 am. Patient is agreeable to date and time. Placed in mammogram hold.

## 2018-05-12 LAB — CBC
Hematocrit: 43.2 % (ref 34.0–46.6)
Hemoglobin: 14.3 g/dL (ref 11.1–15.9)
MCH: 30 pg (ref 26.6–33.0)
MCHC: 33.1 g/dL (ref 31.5–35.7)
MCV: 91 fL (ref 79–97)
Platelets: 371 10*3/uL (ref 150–379)
RBC: 4.77 x10E6/uL (ref 3.77–5.28)
RDW: 13.2 % (ref 12.3–15.4)
WBC: 6.9 10*3/uL (ref 3.4–10.8)

## 2018-05-12 LAB — LIPID PANEL
Chol/HDL Ratio: 4.8 ratio — ABNORMAL HIGH (ref 0.0–4.4)
Cholesterol, Total: 215 mg/dL — ABNORMAL HIGH (ref 100–199)
HDL: 45 mg/dL (ref >39)
LDL Calculated: 131 mg/dL — ABNORMAL HIGH (ref 0–99)
Triglycerides: 193 mg/dL — ABNORMAL HIGH (ref 0–149)
VLDL Cholesterol Cal: 39 mg/dL (ref 5–40)

## 2018-05-12 LAB — COMPREHENSIVE METABOLIC PANEL
ALT: 17 IU/L (ref 0–32)
AST: 22 IU/L (ref 0–40)
Albumin/Globulin Ratio: 1.7 (ref 1.2–2.2)
Albumin: 4.8 g/dL (ref 3.5–5.5)
Alkaline Phosphatase: 83 IU/L (ref 39–117)
BUN/Creatinine Ratio: 9 (ref 9–23)
BUN: 9 mg/dL (ref 6–20)
Bilirubin Total: 0.4 mg/dL (ref 0.0–1.2)
CO2: 25 mmol/L (ref 20–29)
Calcium: 9.7 mg/dL (ref 8.7–10.2)
Chloride: 102 mmol/L (ref 96–106)
Creatinine, Ser: 1.04 mg/dL — ABNORMAL HIGH (ref 0.57–1.00)
GFR calc Af Amer: 89 mL/min/{1.73_m2} (ref >59)
GFR calc non Af Amer: 78 mL/min/{1.73_m2} (ref >59)
Globulin, Total: 2.8 g/dL (ref 1.5–4.5)
Glucose: 53 mg/dL — ABNORMAL LOW (ref 65–99)
Potassium: 5.1 mmol/L (ref 3.5–5.2)
Sodium: 141 mmol/L (ref 134–144)
Total Protein: 7.6 g/dL (ref 6.0–8.5)

## 2018-05-12 LAB — URINE CULTURE

## 2018-05-12 LAB — URINALYSIS, MICROSCOPIC ONLY
Casts: NONE SEEN /lpf
Epithelial Cells (non renal): >10 /hpf — AB (ref 0–10)

## 2018-05-12 LAB — HEP, RPR, HIV PANEL
HIV Screen 4th Generation wRfx: NONREACTIVE
Hepatitis B Surface Ag: NEGATIVE
RPR Ser Ql: NONREACTIVE

## 2018-05-12 LAB — HEPATITIS C ANTIBODY: Hep C Virus Ab: <0.1 s/co ratio (ref 0.0–0.9)

## 2018-05-12 LAB — GC/CHLAMYDIA PROBE AMP
Chlamydia trachomatis, NAA: NEGATIVE
Neisseria gonorrhoeae by PCR: NEGATIVE

## 2018-05-16 ENCOUNTER — Telehealth: Payer: Self-pay | Admitting: *Deleted

## 2018-05-16 NOTE — Telephone Encounter (Signed)
Returning a call to Elaine.  °

## 2018-05-16 NOTE — Telephone Encounter (Signed)
Left message to call regarding lab results -eh 

## 2018-05-16 NOTE — Telephone Encounter (Signed)
Spoke with patient and gave results and recommendations. Scheduled 2 month recheck of labs -eh

## 2018-05-16 NOTE — Telephone Encounter (Signed)
-----   Message from Romualdo Bolk, MD sent at 05/15/2018  5:56 PM EDT ----- Please inform the patient that her urine culture was negative (she was on antibiotics) Her creatinine was minimally elevated, but her GFR was normal, likely do to dehydration.  Her glucose was low Her lipid panel was elevated.  Her STD testing was negative. For her elevated lipids,  I would recommend a mediterranean diet.  A mediterranean diet is high in fruits, vegetables, whole grains, fish, chicken, nuts, healthy fats (olive oil or canola oil). Low fat dairy. Limit butter, margarine, red meat and sweets. She should also limit carbohydrates (secondary to elevated triglycerides).  I would recommend that she return for a fasting lipid panel, glucose and renal function in 3 months. Please arrange

## 2018-05-17 ENCOUNTER — Telehealth: Payer: Self-pay | Admitting: *Deleted

## 2018-05-17 ENCOUNTER — Ambulatory Visit
Admission: RE | Admit: 2018-05-17 | Discharge: 2018-05-17 | Disposition: A | Discharge status: Home / Self Care | Payer: PRIVATE HEALTH INSURANCE | Source: Ambulatory Visit | Attending: Obstetrics and Gynecology | Admitting: Obstetrics and Gynecology

## 2018-05-17 DIAGNOSIS — N6324 Unspecified lump in the left breast, lower inner quadrant: Secondary | ICD-10-CM

## 2018-05-17 DIAGNOSIS — N6489 Other specified disorders of breast: Secondary | ICD-10-CM | POA: Diagnosis not present

## 2018-05-17 NOTE — Telephone Encounter (Signed)
Notes recorded by Leda Min, RN on 05/17/2018 at 11:19 AM EDT Left message to call Noreene Larsson at [phone].

## 2018-05-17 NOTE — Telephone Encounter (Signed)
-----   Message from Romualdo Bolk, MD sent at 05/17/2018 10:21 AM EDT ----- Negative imaging. Please have her return for a 3 month breast check. Take out of mammogram hold

## 2018-05-19 DIAGNOSIS — G4700 Insomnia, unspecified: Secondary | ICD-10-CM | POA: Diagnosis not present

## 2018-05-19 DIAGNOSIS — F419 Anxiety disorder, unspecified: Secondary | ICD-10-CM | POA: Diagnosis not present

## 2018-05-23 NOTE — Telephone Encounter (Signed)
Patient canceled her 3 month breast check 05/25/18. Patient does not wish to reschedule. Patient states that her test were all normal and does not need this appointment.

## 2018-05-24 NOTE — Telephone Encounter (Signed)
Spoke with patient. Reviewed importance of breast f/u and recheck. Offered to combine lab appt and OV. OV scheduled for 08/09/18 at 9:30am, lab appt cancelled. Patient aware fasting labs will be collected same day.   Routing to provider for final review. Patient is agreeable to disposition. Will close encounter.

## 2018-05-25 ENCOUNTER — Ambulatory Visit: Payer: BLUE CROSS/BLUE SHIELD | Admitting: Obstetrics and Gynecology

## 2018-05-30 DIAGNOSIS — F3341 Major depressive disorder, recurrent, in partial remission: Secondary | ICD-10-CM | POA: Diagnosis not present

## 2018-05-30 DIAGNOSIS — F411 Generalized anxiety disorder: Secondary | ICD-10-CM | POA: Diagnosis not present

## 2018-06-06 DIAGNOSIS — F411 Generalized anxiety disorder: Secondary | ICD-10-CM | POA: Diagnosis not present

## 2018-06-14 DIAGNOSIS — F411 Generalized anxiety disorder: Secondary | ICD-10-CM | POA: Diagnosis not present

## 2018-06-20 DIAGNOSIS — N3011 Interstitial cystitis (chronic) with hematuria: Secondary | ICD-10-CM | POA: Diagnosis not present

## 2018-06-27 DIAGNOSIS — F411 Generalized anxiety disorder: Secondary | ICD-10-CM | POA: Diagnosis not present

## 2018-07-13 DIAGNOSIS — F411 Generalized anxiety disorder: Secondary | ICD-10-CM | POA: Diagnosis not present

## 2018-07-24 DIAGNOSIS — F411 Generalized anxiety disorder: Secondary | ICD-10-CM | POA: Diagnosis not present

## 2018-07-31 DIAGNOSIS — F411 Generalized anxiety disorder: Secondary | ICD-10-CM | POA: Diagnosis not present

## 2018-08-07 NOTE — Progress Notes (Signed)
GYNECOLOGY  VISIT   HPI: 22 y.o.   Single  Caucasian  female   [id number] with No LMP recorded.  Patient is having ongoing spotting. here for 3 month breast recheck. She was noted to have a lump in her left breast at her annual exam. Imaging was negative.  Needs lab recheck as well. At her annual she had an elevated lipid panel, low glucose and elevated creatinine.  She has a mirena IUD, placed in 2/19. She was initially having regular cycles, last month was her last cycle, now overdue and just spotting. Light and tolerable.  She c/o vulvar itching for the last 1-2 days, no abnormal d/c.    GYNECOLOGIC HISTORY: No LMP recorded.  Patient is having ongoing spotting Contraception: Mirena IUD Menopausal hormone therapy: None        OB History    Gravida  0   Para  0   Term  0   Preterm  0   AB  0   Living  0     SAB  0   TAB  0   Ectopic  0   Multiple  0   Live Births                 Patient Active Problem List   Diagnosis Date Noted  . Menstrual migraine 08/31/2013  . Menorrhagia 08/31/2013    Past Medical History:  Diagnosis Date  . ADHD   . Anxiety   . Depression   . Heart murmur   . Migraines    with aura    Past Surgical History:  Procedure Laterality Date  . BIOPSY THYROID     benign    Current Outpatient Medications  Medication Sig Dispense Refill  . amphetamine-dextroamphetamine (ADDERALL) 10 MG tablet Take 10 mg by mouth daily with breakfast.    . ARIPiprazole (ABILIFY) 2 MG tablet Take 2 mg by mouth every evening.  0  . nitrofurantoin (MACRODANTIN) 50 MG capsule 1 tablet po prn intercourse 30 capsule 2  . propranolol (INDERAL) 10 MG tablet Take 10 mg by mouth 3 (three) times daily.    . sertraline (ZOLOFT) 100 MG tablet Take 100 mg by mouth daily.     No current facility-administered medications for this visit.      ALLERGIES: Amoxicillin  Family History  Problem Relation Age of Onset  . Thyroid disease Mother        hypo  .  Aneurysm Maternal Aunt   . Aneurysm Maternal Grandmother     Social History   Socioeconomic History  . Marital status: Single    Spouse name: Not on file  . Number of children: Not on file  . Years of education: Not on file  . Highest education level: Not on file  Occupational History  . Not on file  Social Needs  . Financial resource strain: Not on file  . Food insecurity:    Worry: Not on file    Inability: Not on file  . Transportation needs:    Medical: Not on file    Non-medical: Not on file  Tobacco Use  . Smoking status: Current Some Day Smoker    Types: E-cigarettes  . Smokeless tobacco: Never Used  Substance and Sexual Activity  . Alcohol use: Yes    Alcohol/week: 1.0 standard drinks    Types: 1 Standard drinks or equivalent per week  . Drug use: No  . Sexual activity: Yes    Partners: Male  Birth control/protection: IUD    Comment: Mirena 01/2018  Lifestyle  . Physical activity:    Days per week: Not on file    Minutes per session: Not on file  . Stress: Not on file  Relationships  . Social connections:    Talks on phone: Not on file    Gets together: Not on file    Attends religious service: Not on file    Active member of club or organization: Not on file    Attends meetings of clubs or organizations: Not on file    Relationship status: Not on file  . Intimate partner violence:    Fear of current or ex partner: Not on file    Emotionally abused: Not on file    Physically abused: Not on file    Forced sexual activity: Not on file  Other Topics Concern  . Not on file  Social History Narrative  . Not on file    Review of Systems  Constitutional: Negative.   HENT: Negative.   Cardiovascular: Negative.   Gastrointestinal: Negative.   Genitourinary:       Vaginal itching Menses changes-ongoing spotting  Musculoskeletal: Negative.   Skin: Negative.   Neurological: Negative.   Endo/Heme/Allergies: Negative.        Craving sweets Excessive  thirst  Psychiatric/Behavioral: Negative.     PHYSICAL EXAMINATION:    BP 108/60 (BP Location: Right Arm, Patient Position: Sitting, Cuff Size: Normal)   Pulse 80   Wt 152 lb 12.8 oz (69.3 kg)   BMI 23.23 kg/m     General appearance: alert, cooperative and appears stated age Breasts: bilateral fibrocystic changes, tender to palpation in the left breast at 8 o'clock several cm from the areolar region, no mass in that location. No change in lima bean shaped lump at 8-9 o'clock just outside the areolar region. No skin changes Abdomen: soft, non-tender; non distended, no masses,  no organomegaly  Pelvic: External genitalia:  no lesions, slight erythema              Urethra:  normal appearing urethra with no masses, tenderness or lesions              Bartholins and Skenes: normal                 Vagina: normal appearing vagina with a slight increase in thick, white vaginal d/c              Cervix: no lesions and IUD string 1.5 cm               Chaperone was present for exam.  Wet prep: no clue, no trich, few wbc KOH: no yeast PH: 4   ASSESSMENT Breast check, stable breast lump New tender area on breast exam, she will call if it persists Vulvovaginitis, normal slides Elevated lipids, elevated creatinine, low glucose    PLAN Continue monthly breast exams Affirm sent Valisone ointment for pruritus Fasting labs today    An After Visit Summary was printed and given to the patient.

## 2018-08-09 ENCOUNTER — Other Ambulatory Visit: Payer: Self-pay

## 2018-08-09 ENCOUNTER — Ambulatory Visit (INDEPENDENT_AMBULATORY_CARE_PROVIDER_SITE_OTHER): Payer: BLUE CROSS/BLUE SHIELD | Admitting: Obstetrics and Gynecology

## 2018-08-09 ENCOUNTER — Encounter: Payer: Self-pay | Admitting: Obstetrics and Gynecology

## 2018-08-09 VITALS — BP 108/60 | HR 80 | Wt 152.8 lb

## 2018-08-09 DIAGNOSIS — R7989 Other specified abnormal findings of blood chemistry: Secondary | ICD-10-CM | POA: Diagnosis not present

## 2018-08-09 DIAGNOSIS — E785 Hyperlipidemia, unspecified: Secondary | ICD-10-CM

## 2018-08-09 DIAGNOSIS — F411 Generalized anxiety disorder: Secondary | ICD-10-CM | POA: Diagnosis not present

## 2018-08-09 DIAGNOSIS — E162 Hypoglycemia, unspecified: Secondary | ICD-10-CM

## 2018-08-09 DIAGNOSIS — R7309 Other abnormal glucose: Secondary | ICD-10-CM

## 2018-08-09 DIAGNOSIS — N63 Unspecified lump in unspecified breast: Secondary | ICD-10-CM | POA: Diagnosis not present

## 2018-08-09 DIAGNOSIS — N760 Acute vaginitis: Secondary | ICD-10-CM | POA: Diagnosis not present

## 2018-08-09 MED ORDER — BETAMETHASONE VALERATE 0.1 % EX OINT: TOPICAL_OINTMENT | CUTANEOUS | 0 refills | Status: DC

## 2018-08-10 ENCOUNTER — Telehealth: Payer: Self-pay | Admitting: *Deleted

## 2018-08-10 LAB — LIPID PANEL
Chol/HDL Ratio: 4 ratio (ref 0.0–4.4)
Cholesterol, Total: 190 mg/dL (ref 100–199)
HDL: 47 mg/dL (ref >39)
LDL Calculated: 122 mg/dL — ABNORMAL HIGH (ref 0–99)
Triglycerides: 105 mg/dL (ref 0–149)
VLDL Cholesterol Cal: 21 mg/dL (ref 5–40)

## 2018-08-10 LAB — RENAL FUNCTION PANEL
Albumin: 4.4 g/dL (ref 3.5–5.5)
BUN/Creatinine Ratio: 12 (ref 9–23)
BUN: 10 mg/dL (ref 6–20)
CO2: 24 mmol/L (ref 20–29)
Calcium: 9.2 mg/dL (ref 8.7–10.2)
Chloride: 106 mmol/L (ref 96–106)
Creatinine, Ser: 0.82 mg/dL (ref 0.57–1.00)
GFR calc Af Amer: 119 mL/min/{1.73_m2} (ref >59)
GFR calc non Af Amer: 103 mL/min/{1.73_m2} (ref >59)
Glucose: 84 mg/dL (ref 65–99)
Phosphorus: 4.6 mg/dL — ABNORMAL HIGH (ref 2.5–4.5)
Potassium: 4.6 mmol/L (ref 3.5–5.2)
Sodium: 145 mmol/L — ABNORMAL HIGH (ref 134–144)

## 2018-08-10 LAB — VAGINITIS/VAGINOSIS, DNA PROBE
Candida Species: POSITIVE — AB
Gardnerella vaginalis: POSITIVE — AB
Trichomonas vaginosis: NEGATIVE

## 2018-08-10 MED ORDER — FLUCONAZOLE 150 MG PO TABS: ORAL_TABLET | ORAL | 0 refills | Status: DC

## 2018-08-10 MED ORDER — METRONIDAZOLE 500 MG PO TABS: 500.0000 mg | ORAL_TABLET | Freq: Two times a day (BID) | ORAL | 0 refills | Status: DC

## 2018-08-10 NOTE — Telephone Encounter (Signed)
Patient returned call. All results reviewed with patient and she verbalized understanding. Patient requesting oral flagyl. Instructions on use reviewed for flagyl and diflucan. ETOH precautions reviewed and patient verbalized understanding. Pharmacy on file confirmed. Prescription for flagyl 500 mg, #14, 0RF and Diflucan 150mg , #2, 0RF sent to pharmacy on file. Patient advised if symptoms do not resolve after treatment to return call to office. Patient also advised copy of lab results will be sent to PCP and she should follow up with PCP in regards to lab work. Patient agreeable.   Routing to provider for final review. Patient agreeable to disposition. Will close encounter.

## 2018-08-10 NOTE — Telephone Encounter (Signed)
Message left to return call to Exa Bomba at [phone].    

## 2018-08-10 NOTE — Telephone Encounter (Signed)
-----   Message from Romualdo BolkJill Evelyn Jertson, MD sent at 08/10/2018  1:21 PM EDT ----- Please inform the patient that her vaginitis probe was + for BV and yeast. Treat with flagyl (either oral or vaginal, her choice), no ETOH while on Flagyl.  Oral: Flagyl 500 mg BID x 7 days, or Vaginal: Metrogel, 1 applicator per vagina q day x 5 days. Also treat with diflucan 150 mg x 1, may repeat in 72 hours if still symptomatic. #2, no refills  Her Lipid panel is much better, her LDL is mildly elevated, the rest of her lipid panel has normalized. Her renal function in now normal.  Her sodium and phosphorus are now just over the normal range. At last check her sodium was fine, phosphorous not checked.  Please send a copy of her labs to her primary. My guess is that these changes aren't significant but should be followed up by her primary.

## 2018-08-14 DIAGNOSIS — F411 Generalized anxiety disorder: Secondary | ICD-10-CM | POA: Diagnosis not present

## 2018-08-25 DIAGNOSIS — F411 Generalized anxiety disorder: Secondary | ICD-10-CM | POA: Diagnosis not present

## 2018-09-01 DIAGNOSIS — F411 Generalized anxiety disorder: Secondary | ICD-10-CM | POA: Diagnosis not present

## 2018-09-05 DIAGNOSIS — F411 Generalized anxiety disorder: Secondary | ICD-10-CM | POA: Diagnosis not present

## 2018-09-18 DIAGNOSIS — F411 Generalized anxiety disorder: Secondary | ICD-10-CM | POA: Diagnosis not present

## 2018-09-25 DIAGNOSIS — F411 Generalized anxiety disorder: Secondary | ICD-10-CM | POA: Diagnosis not present

## 2018-10-03 DIAGNOSIS — F411 Generalized anxiety disorder: Secondary | ICD-10-CM | POA: Diagnosis not present

## 2018-10-10 DIAGNOSIS — F411 Generalized anxiety disorder: Secondary | ICD-10-CM | POA: Diagnosis not present

## 2018-10-17 DIAGNOSIS — F411 Generalized anxiety disorder: Secondary | ICD-10-CM | POA: Diagnosis not present

## 2018-10-18 DIAGNOSIS — N390 Urinary tract infection, site not specified: Secondary | ICD-10-CM | POA: Diagnosis not present

## 2018-10-24 DIAGNOSIS — F411 Generalized anxiety disorder: Secondary | ICD-10-CM | POA: Diagnosis not present

## 2018-10-31 DIAGNOSIS — F411 Generalized anxiety disorder: Secondary | ICD-10-CM | POA: Diagnosis not present

## 2018-11-07 DIAGNOSIS — F411 Generalized anxiety disorder: Secondary | ICD-10-CM | POA: Diagnosis not present

## 2018-11-29 ENCOUNTER — Emergency Department (HOSPITAL_COMMUNITY)
Admission: EM | Admit: 2018-11-29 | Discharge: 2018-11-29 | Disposition: A | Discharge status: Home / Self Care | Payer: BLUE CROSS/BLUE SHIELD | Attending: Emergency Medicine | Admitting: Emergency Medicine

## 2018-11-29 ENCOUNTER — Ambulatory Visit: Payer: Self-pay | Admitting: Nurse Practitioner

## 2018-11-29 ENCOUNTER — Encounter (HOSPITAL_COMMUNITY): Payer: Self-pay | Admitting: Emergency Medicine

## 2018-11-29 ENCOUNTER — Other Ambulatory Visit: Payer: Self-pay

## 2018-11-29 ENCOUNTER — Emergency Department (HOSPITAL_COMMUNITY): Payer: BLUE CROSS/BLUE SHIELD

## 2018-11-29 VITALS — BP 115/60 | HR 78 | Temp 98.8°F | Resp 16 | Wt 151.2 lb

## 2018-11-29 DIAGNOSIS — N3001 Acute cystitis with hematuria: Secondary | ICD-10-CM | POA: Diagnosis not present

## 2018-11-29 DIAGNOSIS — Z79899 Other long term (current) drug therapy: Secondary | ICD-10-CM | POA: Insufficient documentation

## 2018-11-29 DIAGNOSIS — R350 Frequency of micturition: Secondary | ICD-10-CM | POA: Diagnosis not present

## 2018-11-29 DIAGNOSIS — F17290 Nicotine dependence, other tobacco product, uncomplicated: Secondary | ICD-10-CM | POA: Insufficient documentation

## 2018-11-29 DIAGNOSIS — R300 Dysuria: Secondary | ICD-10-CM

## 2018-11-29 LAB — POCT URINALYSIS DIPSTICK
Bilirubin, UA: NEGATIVE
Glucose, UA: NEGATIVE
Ketones, UA: NEGATIVE
Leukocytes, UA: NEGATIVE
Nitrite, UA: NEGATIVE
Protein, UA: NEGATIVE
Spec Grav, UA: 1.025 (ref 1.010–1.025)
Urobilinogen, UA: 0.2 E.U./dL
pH, UA: 5 (ref 5.0–8.0)

## 2018-11-29 LAB — URINALYSIS, ROUTINE W REFLEX MICROSCOPIC
Bilirubin Urine: NEGATIVE
Glucose, UA: NEGATIVE mg/dL
Ketones, ur: NEGATIVE mg/dL
Nitrite: NEGATIVE
Protein, ur: NEGATIVE mg/dL
Specific Gravity, Urine: 1.003 — ABNORMAL LOW (ref 1.005–1.030)
pH: 7 (ref 5.0–8.0)

## 2018-11-29 LAB — POC URINE PREG, ED: Preg Test, Ur: NEGATIVE

## 2018-11-29 NOTE — ED Triage Notes (Signed)
Patient c/o urinary frequency, urgency and dysuria today. Hx UTI.

## 2018-11-29 NOTE — Patient Instructions (Signed)

## 2018-11-29 NOTE — Progress Notes (Signed)
Subjective:    Amanda Fowler is a 22 y.o. female who complains of burning with urination, frequency, hematuria and urgency since this morning.  Patient also complains of fever and vaginal discharge. Patient denies back pain and stomach ache.  Patient does have a history of recurrent UTI.  Patient does not have a history of pyelonephritis.  The patient states she was last seen [facility] school, patient had a urine culture done and it was positive for strep.  The patient did receive antibiotics at that time.  The patient states that her UTIs are usually preempted by sexual intercourse.  Patient states she has seen urology and they informed her to perform better hygiene, and she felt she did not get much at that visit and has not returned since.  The patient states that when she was at her GYN appointment earlier this year, her GYN did give her a supply of Macrobid.  The patient states that she took 2 Macrobid 50 mg tablets this morning.  The patient denies any chance of STD/STI.  Patient also denies any chance of pregnancy.  The following portions of the patient's history were reviewed and updated as appropriate: allergies, current medications and past medical history.  Review of Systems Constitutional: positive for fevers, negative for anorexia, chills, fatigue, malaise and sweats Ears, nose, mouth, throat, and face: negative Respiratory: negative Cardiovascular: negative Gastrointestinal: negative Genitourinary:positive for vaginal discharge, dysuria, frequency and hematuria, negative for abnormal menstrual periods, decreased stream, nocturia and urinary incontinence Neurological: negative    Objective:    BP 115/60 (BP Location: Right Arm, Patient Position: Sitting, Cuff Size: Normal)   Pulse 78   Temp 98.8 F (37.1 C) (Oral)   Resp 16   Wt 151 lb 3.2 oz (68.6 kg)   SpO2 98%   BMI 22.99 kg/m  General: alert, cooperative and no distress  Abdomen:  soft, non-tender, without masses or organomegaly in the entire abdomen  Back: back muscles are full ROM, no CVA tenderness  GU: defer exam   Laboratory:  Urine dipstick shows sp gravity 1.025, negative for glucose, trace for hemoglobin, negative for ketones, negative for leukocyte esterase, negative for nitrites, negative for protein and 0.2 for urobilinogen.   Micro exam: not done.    Assessment:   Dysuria   Plan:   Exam findings, diagnosis etiology and medication use and indications reviewed with patient. Follow- Up and discharge instructions provided.  Discussed this patient with Dr. Hyacinth MeekerMiller, supervising physician.  Informed Dr. Hyacinth MeekerMiller that the patient's exam was negative, and did not indicate any treatment for UTI at this time.  Dr. Hyacinth MeekerMiller suggested sending the patient to the ER to rule out pyelonephritis or a renal stone due to her urinary symptoms without other symptomology.  Spoke with the patient and informed her of the concern of her symptoms.  Informed patient to go to the emergency department so that she can have the proper work-up to include imaging possibly, labs, and a needed urine culture.  Patient states [facility].  Patient education was provided. Patient verbalized understanding of information provided and agrees with plan of care (POC), all questions answered.   1. Dysuria  - POCT urinalysis dipstick -Patient sent to ER for further evaluation.

## 2018-11-29 NOTE — ED Provider Notes (Signed)
[facility]-EMERGENCY DEPT Provider Note   CSN: [id number] Arrival date & time: 11/29/18  4098     History   Chief Complaint Chief Complaint  Patient presents with  . Urinary Frequency  . Dysuria    HPI Amanda Fowler is a 22 y.o. female.  HPI Patient presents with concern of ongoing polyuria, dysuria, Patient has a history of recurrent urinary tract infection. She has been on multiple antibiotics over the past year or so, notes that she typically has episodes of polyuria, dysuria, improved after a brief course typically of Macrobid. She notes that after recent completion of 1 therapeutic regimen, she developed dysuria again, polyuria, with new cramping in the left lateral abdomen. She went to urgent care, and was referred here for further evaluation. She has no history of kidney stones. She denies objective fever, though she does have chills. No vomiting, no diarrhea. Patient notes history of UTI, as above, as well as after every episode of intercourse, in spite of multiple interventions. Patient is here with her mother, was present for much of the exam except for the last portion. Past Medical History:  Diagnosis Date  . ADHD   . Anxiety   . Depression   . Heart murmur   . Migraines    with aura    Patient Active Problem List   Diagnosis Date Noted  . Menstrual migraine 08/31/2013  . Menorrhagia 08/31/2013    Past Surgical History:  Procedure Laterality Date  . BIOPSY THYROID     benign     OB History    Gravida  0   Para  0   Term  0   Preterm  0   AB  0   Living  0     SAB  0   TAB  0   Ectopic  0   Multiple  0   Live Births               Home Medications    Prior to Admission medications   Medication Sig Start Date End Date Taking? Authorizing Provider  acetaminophen (TYLENOL) 500 MG tablet Take 1,000 mg by mouth daily as needed for mild pain or fever.   Yes [provider]    amphetamine-dextroamphetamine (ADDERALL) 10 MG tablet Take 10 mg by mouth daily as needed (ADHD).    Yes [provider]  ARIPiprazole (ABILIFY) 2 MG tablet Take 2 mg by mouth every evening. 07/26/18  Yes [provider]  hydrOXYzine (ATARAX/VISTARIL) 10 MG tablet Take 10-20 mg by mouth 2 (two) times daily as needed for anxiety. 11/07/18  Yes [provider]  levonorgestrel (MIRENA) 20 MCG/24HR IUD 1 each by Intrauterine route once.   Yes [provider]  Multiple Vitamins-Minerals (ALIVE WOMENS GUMMY PO) Take by mouth.   Yes [provider]  nitrofurantoin (MACRODANTIN) 50 MG capsule 1 tablet po prn intercourse 05/11/18  Yes Romualdo Bolk, MD  propranolol (INDERAL) 10 MG tablet Take 10 mg by mouth 3 (three) times daily.   Yes [provider]  venlafaxine XR (EFFEXOR-XR) 37.5 MG 24 hr capsule Take 112.5 mg by mouth daily with breakfast.  11/28/18  Yes [provider]  VITAMIN D, ERGOCALCIFEROL, PO Take 5,000 Units by mouth daily.  08/22/18  Yes [provider]  VYVANSE 30 MG capsule Take 30 mg by mouth every morning. 11/15/18  Yes [provider]  betamethasone valerate ointment (VALISONE) 0.1 % Apply a pea sized amount topically BID  for 1-2 weeks as needed Patient not taking: Reported on 11/29/2018 08/09/18   Romualdo BolkJertson, Jill Evelyn, MD  fluconazole (DIFLUCAN) 150 MG tablet Take one tablet now, repeat in 72 hours if needed Patient not taking: Reported on 11/29/2018 08/10/18   Romualdo BolkJertson, Jill Evelyn, MD  metroNIDAZOLE (FLAGYL) 500 MG tablet Take 1 tablet (500 mg total) by mouth 2 (two) times daily. Patient not taking: Reported on 11/29/2018 08/10/18   Romualdo BolkJertson, Jill Evelyn, MD    Family History Family History  Problem Relation Age of Onset  . Thyroid disease Mother        hypo  . Aneurysm Maternal Aunt   . Aneurysm Maternal Grandmother     Social History Social History   Tobacco Use  . Smoking status: Current  Some Day Smoker    Types: E-cigarettes  . Smokeless tobacco: Never Used  Substance Use Topics  . Alcohol use: Yes    Alcohol/week: 1.0 standard drinks    Types: 1 Standard drinks or equivalent per week  . Drug use: No     Allergies   Amoxicillin   Review of Systems Review of Systems  Constitutional:       Per HPI, otherwise negative  HENT:       Per HPI, otherwise negative  Respiratory:       Per HPI, otherwise negative  Cardiovascular:       Per HPI, otherwise negative  Gastrointestinal: Negative for vomiting.  Endocrine:       Negative aside from HPI  Genitourinary:       Neg aside from HPI   Musculoskeletal:       Per HPI, otherwise negative  Skin: Negative.   Neurological: Negative for syncope.     Physical Exam Updated Vital Signs BP 122/85   Pulse 71   Resp 16   SpO2 100%   Physical Exam  Constitutional: She is oriented to person, place, and time. She appears well-developed and well-nourished. No distress.  HENT:  Head: Normocephalic and atraumatic.  Eyes: Conjunctivae and EOM are normal.  Cardiovascular: Normal rate and regular rhythm.  Pulmonary/Chest: Effort normal and breath sounds normal. No stridor. No respiratory distress.  Abdominal: She exhibits no distension.  Mild left lateral abdominal and flank pain  Musculoskeletal: She exhibits no edema.  Neurological: She is alert and oriented to person, place, and time. No cranial nerve deficit.  Skin: Skin is warm and dry.  Psychiatric: She has a normal mood and affect.  Nursing note and vitals reviewed.    ED Treatments / Results  Labs (all labs ordered are listed, but only abnormal results are displayed) Labs Reviewed  URINALYSIS, ROUTINE W REFLEX MICROSCOPIC - Abnormal; Notable for the following components:      Result Value   Color, Urine STRAW (*)    Specific Gravity, Urine 1.003 (*)    Hgb urine dipstick SMALL (*)    Leukocytes, UA SMALL (*)    Bacteria, UA RARE (*)    All other  components within normal limits  POC URINE PREG, ED    EKG None  Radiology Ct Renal Stone Study  Result Date: 11/29/2018 CLINICAL DATA:  Initial evaluation for acute urinary frequency, urgency and dysuria. EXAM: CT ABDOMEN AND PELVIS WITHOUT CONTRAST TECHNIQUE: Multidetector CT imaging of the abdomen and pelvis was performed following the standard protocol without IV contrast. COMPARISON:  None. FINDINGS: Lower chest: Visualized lung bases are clear. Hepatobiliary: Limited noncontrast evaluation of the liver is unremarkable. Gallbladder within normal limits. No  biliary dilatation. Pancreas: Pancreas within normal limits. Spleen: Unremarkable. Adrenals/Urinary Tract: Adrenal glands are normal. Kidneys equal in size without nephrolithiasis or hydronephrosis. No radiopaque calculi seen along the course of either renal collecting system. No appreciable hydroureter. Partially distended bladder within normal limits. No layering stones within the bladder lumen. Stomach/Bowel: Stomach within normal limits. No evidence for obstruction. No acute inflammatory changes seen about the bowels. Normal appendix. Vascular/Lymphatic: Intra-abdominal aorta of normal caliber. No adenopathy. Reproductive: IUD in place within the uterus. Uterus and ovaries otherwise unremarkable. Other: No free air or fluid. Musculoskeletal: No acute osseous abnormality. No discrete lytic or blastic osseous lesions. IMPRESSION: 1. No CT evidence for nephrolithiasis or obstructive uropathy. 2. No other acute intra-abdominal or pelvic process identified. Electronically Signed   By: Rise Mu M.D.   On: 11/29/2018 22:23    Procedures Procedures (including critical care time)  Medications Ordered in ED Medications - No data to display   Initial Impression / Assessment and Plan / ED Course  I have reviewed the triage vital signs and the nursing notes.  Pertinent labs & imaging results that were available during my care of the  patient were reviewed by me and considered in my medical decision making (see chart for details).  Update:, Patient in no distress, sitting upright, speaking clearly. I discussed all findings with patient and her mother, discussed options for ongoing consideration of recurrent UTI. We discussed follow-up with urology/gynecology, at local [facility] center given her recurrent events, absence of improvement, in spite of follow-up with family practitioners. Absent distress, hemodynamic instability, evidence for bacteremia, sepsis or complete obstruction, the patient will continue Macrobid, receive Pyridium, follow-up as an outpatient.  Final Clinical Impressions(s) / ED Diagnoses   Final diagnoses:  Acute cystitis with hematuria     Gerhard Munch, MD 11/29/18 2340

## 2018-11-29 NOTE — Discharge Instructions (Addendum)
Please be sure follow-up with a gynecologist -urologist

## 2018-12-05 DIAGNOSIS — F411 Generalized anxiety disorder: Secondary | ICD-10-CM | POA: Diagnosis not present

## 2018-12-11 DIAGNOSIS — F411 Generalized anxiety disorder: Secondary | ICD-10-CM | POA: Diagnosis not present

## 2018-12-18 DIAGNOSIS — F411 Generalized anxiety disorder: Secondary | ICD-10-CM | POA: Diagnosis not present

## 2018-12-29 DIAGNOSIS — F411 Generalized anxiety disorder: Secondary | ICD-10-CM | POA: Diagnosis not present

## 2019-01-05 DIAGNOSIS — F411 Generalized anxiety disorder: Secondary | ICD-10-CM | POA: Diagnosis not present

## 2019-01-19 DIAGNOSIS — F411 Generalized anxiety disorder: Secondary | ICD-10-CM | POA: Diagnosis not present

## 2019-01-23 DIAGNOSIS — S83419A Sprain of medial collateral ligament of unspecified knee, initial encounter: Secondary | ICD-10-CM | POA: Diagnosis not present

## 2019-01-23 DIAGNOSIS — S93402A Sprain of unspecified ligament of left ankle, initial encounter: Secondary | ICD-10-CM | POA: Diagnosis not present

## 2019-01-26 DIAGNOSIS — F411 Generalized anxiety disorder: Secondary | ICD-10-CM | POA: Diagnosis not present

## 2019-01-31 ENCOUNTER — Encounter (HOSPITAL_COMMUNITY): Payer: Self-pay

## 2019-01-31 ENCOUNTER — Emergency Department (HOSPITAL_COMMUNITY)
Admission: EM | Admit: 2019-01-31 | Discharge: 2019-01-31 | Disposition: A | Discharge status: Home / Self Care | Payer: BLUE CROSS/BLUE SHIELD | Attending: Emergency Medicine | Admitting: Emergency Medicine

## 2019-01-31 DIAGNOSIS — F17290 Nicotine dependence, other tobacco product, uncomplicated: Secondary | ICD-10-CM | POA: Diagnosis not present

## 2019-01-31 DIAGNOSIS — N3090 Cystitis, unspecified without hematuria: Secondary | ICD-10-CM | POA: Diagnosis not present

## 2019-01-31 DIAGNOSIS — Z79899 Other long term (current) drug therapy: Secondary | ICD-10-CM | POA: Diagnosis not present

## 2019-01-31 DIAGNOSIS — F909 Attention-deficit hyperactivity disorder, unspecified type: Secondary | ICD-10-CM | POA: Insufficient documentation

## 2019-01-31 DIAGNOSIS — N3091 Cystitis, unspecified with hematuria: Secondary | ICD-10-CM | POA: Diagnosis not present

## 2019-01-31 DIAGNOSIS — R350 Frequency of micturition: Secondary | ICD-10-CM | POA: Diagnosis not present

## 2019-01-31 LAB — CBC
HCT: 39.9 % (ref 36.0–46.0)
Hemoglobin: 12.8 g/dL (ref 12.0–15.0)
MCH: 30.8 pg (ref 26.0–34.0)
MCHC: 32.1 g/dL (ref 30.0–36.0)
MCV: 95.9 fL (ref 80.0–100.0)
Platelets: 293 10*3/uL (ref 150–400)
RBC: 4.16 MIL/uL (ref 3.87–5.11)
RDW: 12.5 % (ref 11.5–15.5)
WBC: 16.9 10*3/uL — ABNORMAL HIGH (ref 4.0–10.5)
nRBC: 0 % (ref 0.0–0.2)

## 2019-01-31 LAB — I-STAT BETA HCG BLOOD, ED (MC, WL, AP ONLY): I-stat hCG, quantitative: <5 m[IU]/mL (ref <5)

## 2019-01-31 LAB — URINALYSIS, ROUTINE W REFLEX MICROSCOPIC
Bacteria, UA: NONE SEEN
Bilirubin Urine: NEGATIVE
Glucose, UA: NEGATIVE mg/dL
Ketones, ur: 5 mg/dL — AB
Nitrite: NEGATIVE
Protein, ur: >=300 mg/dL — AB
Specific Gravity, Urine: 1.031 — ABNORMAL HIGH (ref 1.005–1.030)
pH: 6 (ref 5.0–8.0)

## 2019-01-31 LAB — BASIC METABOLIC PANEL
Anion gap: 7 (ref 5–15)
BUN: 13 mg/dL (ref 6–20)
CO2: 24 mmol/L (ref 22–32)
Calcium: 8.9 mg/dL (ref 8.9–10.3)
Chloride: 106 mmol/L (ref 98–111)
Creatinine, Ser: 0.85 mg/dL (ref 0.44–1.00)
GFR calc Af Amer: >60 mL/min (ref >60)
GFR calc non Af Amer: >60 mL/min (ref >60)
Glucose, Bld: 94 mg/dL (ref 70–99)
Potassium: 3.6 mmol/L (ref 3.5–5.1)
Sodium: 137 mmol/L (ref 135–145)

## 2019-01-31 MED ORDER — PHENAZOPYRIDINE HCL 200 MG PO TABS: 200.0000 mg | ORAL_TABLET | Freq: Three times a day (TID) | ORAL | 0 refills | Status: DC | PRN

## 2019-01-31 MED ORDER — NITROFURANTOIN MONOHYD MACRO 100 MG PO CAPS
100.0000 mg | ORAL_CAPSULE | Freq: Once | ORAL | Status: AC
  Administered 2019-01-31: 100 mg via ORAL
  Filled 2019-01-31: qty 1

## 2019-01-31 MED ORDER — PHENAZOPYRIDINE HCL 200 MG PO TABS
200.0000 mg | ORAL_TABLET | Freq: Once | ORAL | Status: AC
  Administered 2019-01-31: 200 mg via ORAL
  Filled 2019-01-31: qty 1

## 2019-01-31 MED ORDER — NITROFURANTOIN MONOHYD MACRO 100 MG PO CAPS: 100.0000 mg | ORAL_CAPSULE | Freq: Two times a day (BID) | ORAL | 0 refills | Status: DC

## 2019-01-31 NOTE — ED Notes (Signed)
Pt and visitor verbalized discharge instructions and follow up care. Alert and ambulatory

## 2019-01-31 NOTE — ED Provider Notes (Signed)
[facility]-EMERGENCY DEPT Provider Note: Amanda Fowler Amanda Novakowski, MD, FACEP  CSN: [id number] MRN: [id number] ARRIVAL: 01/31/19 at 0231 ROOM: WA15/WA15   CHIEF COMPLAINT  Urinary Tract Infection   HISTORY OF PRESENT ILLNESS  01/31/19 4:40 AM Amanda Fowler is a 23 y.o. female with urinary frequency, burning on urination and blood in urine that began about 6 hours prior to arrival.  She states her symptoms are "really bad".  She has taken ibuprofen without relief.  She denies fever but has had chills.  She denies nausea, vomiting or diarrhea.  She denies low back pain.   Past Medical History:  Diagnosis Date  . ADHD   . Anxiety   . Depression   . Heart murmur   . Migraines    with aura    Past Surgical History:  Procedure Laterality Date  . BIOPSY THYROID     benign    Family History  Problem Relation Age of Onset  . Thyroid disease Mother        hypo  . Aneurysm Maternal Aunt   . Aneurysm Maternal Grandmother     Social History   Tobacco Use  . Smoking status: Current Some Day Smoker    Types: E-cigarettes  . Smokeless tobacco: Never Used  Substance Use Topics  . Alcohol use: Yes    Alcohol/week: 1.0 standard drinks    Types: 1 Standard drinks or equivalent per week  . Drug use: No    Prior to Admission medications   Medication Sig Start Date End Date Taking? Authorizing Provider  acetaminophen (TYLENOL) 500 MG tablet Take 1,000 mg by mouth daily as needed for mild pain or fever.   Yes [provider]  amphetamine-dextroamphetamine (ADDERALL) 10 MG tablet Take 10 mg by mouth daily as needed (ADHD).    Yes [provider]  cholecalciferol (VITAMIN D3) 25 MCG (1000 UT) tablet Take 1,000 Units by mouth daily.   Yes [provider]  hydrOXYzine (ATARAX/VISTARIL) 10 MG tablet Take 10-20 mg by mouth 2 (two) times daily as needed for anxiety. 11/07/18  Yes [provider]  levonorgestrel (MIRENA) 20 MCG/24HR IUD 1 each by Intrauterine  route once.   Yes [provider]  propranolol (INDERAL) 10 MG tablet Take 10 mg by mouth 3 (three) times daily.   Yes [provider]  venlafaxine XR (EFFEXOR-XR) 37.5 MG 24 hr capsule Take 112.5 mg by mouth daily with breakfast.  11/28/18  Yes [provider]  VYVANSE 30 MG capsule Take 30 mg by mouth every morning. 11/15/18  Yes [provider]  betamethasone valerate ointment (VALISONE) 0.1 % Apply a pea sized amount topically BID for 1-2 weeks as needed Patient not taking: Reported on 11/29/2018 08/09/18   Romualdo BolkJertson, Jill Evelyn, MD  fluconazole (DIFLUCAN) 150 MG tablet Take one tablet now, repeat in 72 hours if needed Patient not taking: Reported on 11/29/2018 08/10/18   Romualdo BolkJertson, Jill Evelyn, MD  metroNIDAZOLE (FLAGYL) 500 MG tablet Take 1 tablet (500 mg total) by mouth 2 (two) times daily. Patient not taking: Reported on 11/29/2018 08/10/18   Romualdo BolkJertson, Jill Evelyn, MD  nitrofurantoin (MACRODANTIN) 50 MG capsule 1 tablet po prn intercourse Patient not taking: Reported on 01/31/2019 05/11/18   Romualdo BolkJertson, Jill Evelyn, MD    Allergies Amoxicillin   REVIEW OF SYSTEMS  Negative except as noted here or in the History of Present Illness.   PHYSICAL EXAMINATION  Initial Vital Signs Blood pressure 136/80, pulse 93, temperature 98.7 F (37.1 C), temperature source  Oral, resp. rate 18, height 5\' 8"  (1.727 m), weight 69.4 kg, SpO2 99 %.  Examination General: Well-developed, well-nourished female in no acute distress; appearance consistent with age of record HENT: normocephalic; atraumatic Eyes: pupils equal, round and reactive to light; extraocular muscles intact Neck: supple Heart: regular rate and rhythm Lungs: clear to auscultation bilaterally Abdomen: soft; nondistended; suprapubic tenderness; no masses or hepatosplenomegaly; bowel sounds present GU: No CVA tenderness Extremities: No deformity; full range of motion; pulses normal Neurologic: Awake, alert and  oriented; motor function intact in all extremities and symmetric; no facial droop Skin: Warm and dry Psychiatric: Normal mood and affect   RESULTS  Summary of this visit's results, reviewed by myself:   EKG Interpretation  Date/Time:    Ventricular Rate:    PR Interval:    QRS Duration:   QT Interval:    QTC Calculation:   R Axis:     Text Interpretation:        Laboratory Studies: Results for orders placed or performed during the hospital encounter of 01/31/19 (from the past 24 hour(s))  CBC     Status: Abnormal   Collection Time: 01/31/19  3:24 AM  Result Value Ref Range   WBC 16.9 (H) 4.0 - 10.5 K/uL   RBC 4.16 3.87 - 5.11 MIL/uL   Hemoglobin 12.8 12.0 - 15.0 g/dL   HCT 40.939.9 81.136.0 - 91.446.0 %   MCV 95.9 80.0 - 100.0 fL   MCH 30.8 26.0 - 34.0 pg   MCHC 32.1 30.0 - 36.0 g/dL   RDW 78.212.5 95.611.5 - 21.315.5 %   Platelets 293 150 - 400 K/uL   nRBC 0.0 0.0 - 0.2 %  Basic metabolic panel     Status: None   Collection Time: 01/31/19  3:24 AM  Result Value Ref Range   Sodium 137 135 - 145 mmol/L   Potassium 3.6 3.5 - 5.1 mmol/L   Chloride 106 98 - 111 mmol/L   CO2 24 22 - 32 mmol/L   Glucose, Bld 94 70 - 99 mg/dL   BUN 13 6 - 20 mg/dL   Creatinine, Ser 0.860.85 0.44 - 1.00 mg/dL   Calcium 8.9 8.9 - 57.810.3 mg/dL   GFR calc non Af Amer >60 >60 mL/min   GFR calc Af Amer >60 >60 mL/min   Anion gap 7 5 - 15  Urinalysis, Routine w reflex microscopic- may I&O cath if menses     Status: Abnormal   Collection Time: 01/31/19  3:31 AM  Result Value Ref Range   Color, Urine YELLOW YELLOW   APPearance TURBID (A) CLEAR   Specific Gravity, Urine 1.031 (H) 1.005 - 1.030   pH 6.0 5.0 - 8.0   Glucose, UA NEGATIVE NEGATIVE mg/dL   Hgb urine dipstick LARGE (A) NEGATIVE   Bilirubin Urine NEGATIVE NEGATIVE   Ketones, ur 5 (A) NEGATIVE mg/dL   Protein, ur >=469>=300 (A) NEGATIVE mg/dL   Nitrite NEGATIVE NEGATIVE   Leukocytes, UA MODERATE (A) NEGATIVE   RBC / HPF 0-5 0 - 5 RBC/hpf   Bacteria, UA NONE  SEEN NONE SEEN  I-Stat beta hCG blood, ED     Status: None   Collection Time: 01/31/19  3:36 AM  Result Value Ref Range   I-stat hCG, quantitative <5.0 <5 mIU/mL   Comment 3           Imaging Studies: No results found.  ED COURSE and MDM  Nursing notes and initial vitals signs, including pulse oximetry, reviewed.  Vitals:   01/31/19 0238 01/31/19 0240  BP: 136/80   Pulse: 93   Resp: 18   Temp: 98.7 F (37.1 C)   TempSrc: Oral   SpO2: 99%   Weight:  69.4 kg  Height:  5\' 8"  (1.727 m)   4:46 AM U/S C/W hemorrhagic cystitis.   PROCEDURES    ED DIAGNOSES     ICD-10-CM   1. Hemorrhagic cystitis N30.91        Camryn Lampson, Jonny Ruiz, MD 01/31/19 [phone]

## 2019-01-31 NOTE — ED Triage Notes (Signed)
Pt arrived stating roughly 6 hours ago she began to feel symptoms of a UTI. Pt reports, frequency, burning, and bleeding.

## 2019-02-01 IMAGING — CT CT RENAL STONE PROTOCOL
2 of 4 series · 16 of 46 positions shown, 18 images · non-contrast
Comparison: None.

CLINICAL DATA: Initial evaluation for acute urinary frequency,
urgency and dysuria.

EXAM:
CT ABDOMEN AND PELVIS WITHOUT CONTRAST
TECHNIQUE: Multidetector CT imaging of the abdomen and pelvis was performed
following the standard protocol without IV contrast.

[Series 2: axial st · axial · 0.82mm/px · z∈[-386,+14]mm · 13 of 90 slices shown, 15 images]
[im 5/90  soft-tissue]
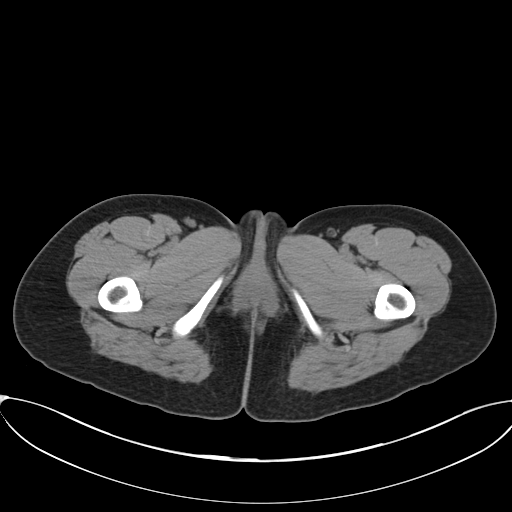
[im 5/90  bone]
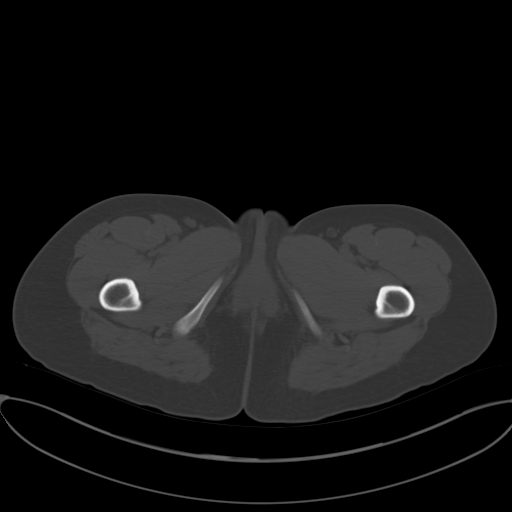
[im 14/90  soft-tissue]
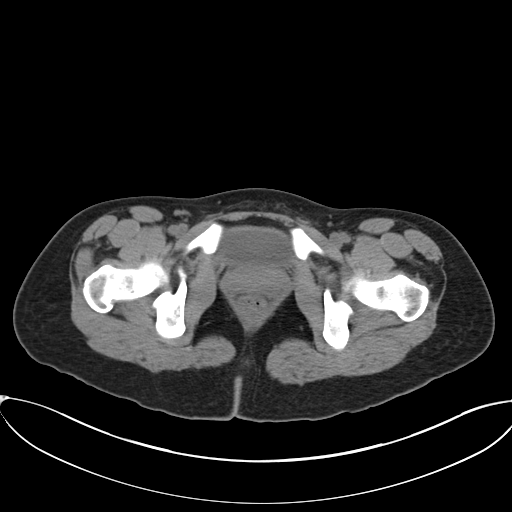
[im 18/90  soft-tissue]
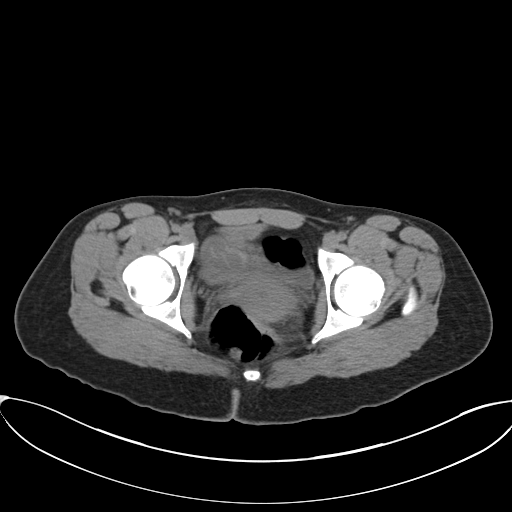
[im 27/90  soft-tissue]
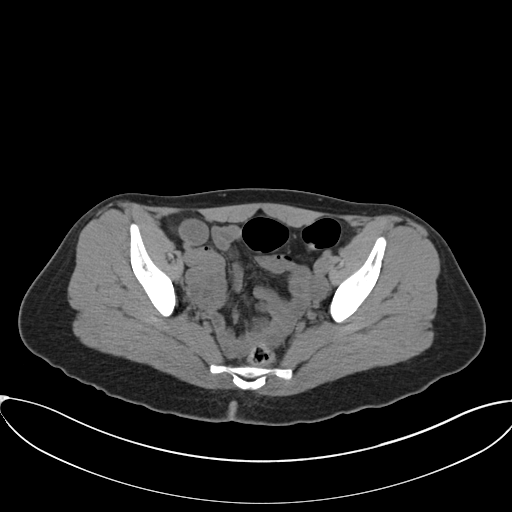
[im 32/90  soft-tissue]
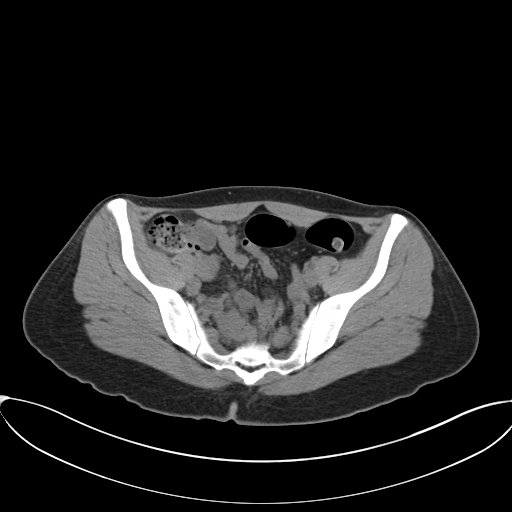
[im 41/90  soft-tissue]
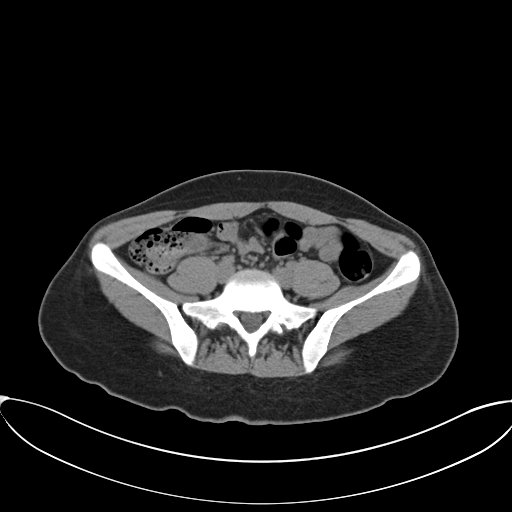
[im 45/90  soft-tissue]
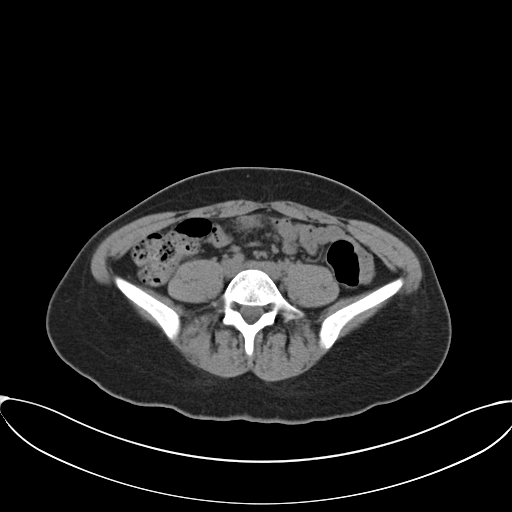
[im 49/90  soft-tissue]
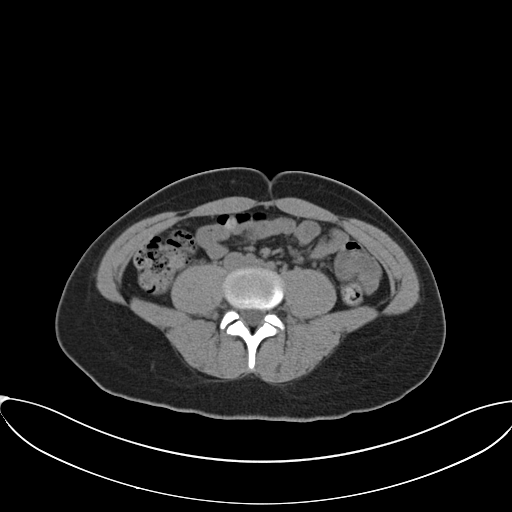
[im 58/90  soft-tissue]
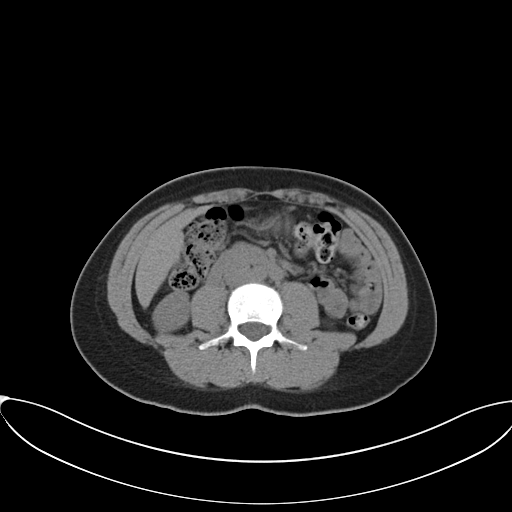
[im 58/90  bone]
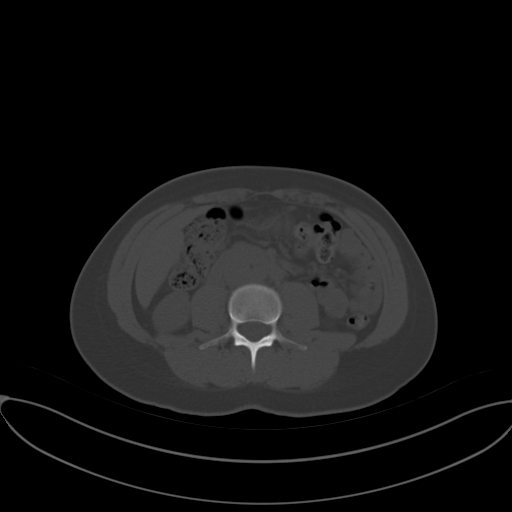
[im 63/90  soft-tissue]
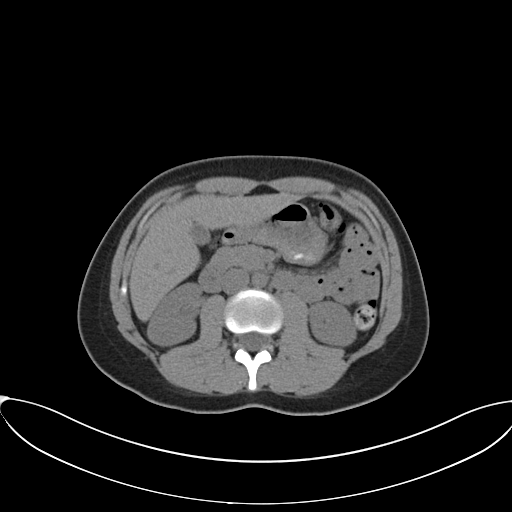
[im 72/90  soft-tissue]
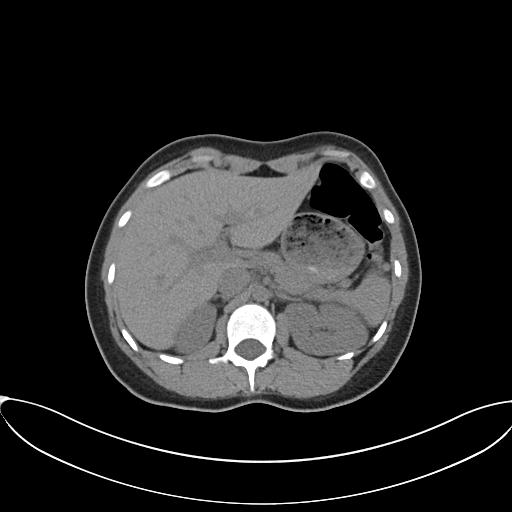
[im 76/90  soft-tissue]
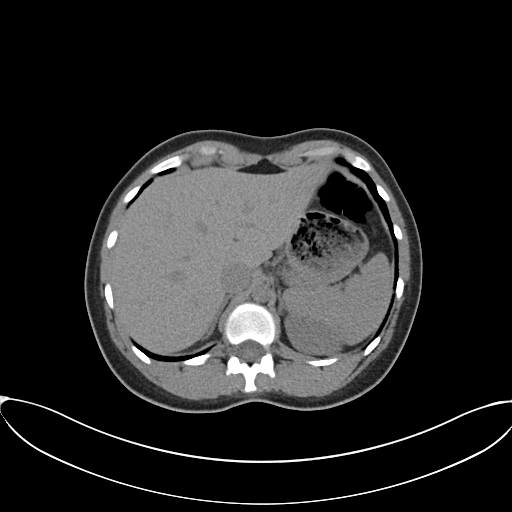
[im 85/90  soft-tissue]
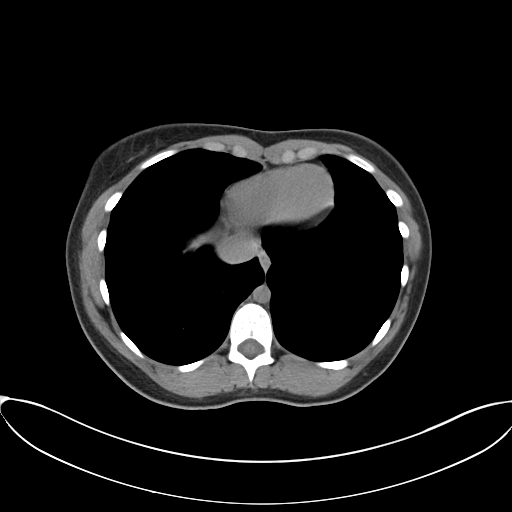

[Series 5: coronal · coronal · 0.69mm/px · 3 of 129 slices shown]
[im 43/129  soft-tissue]
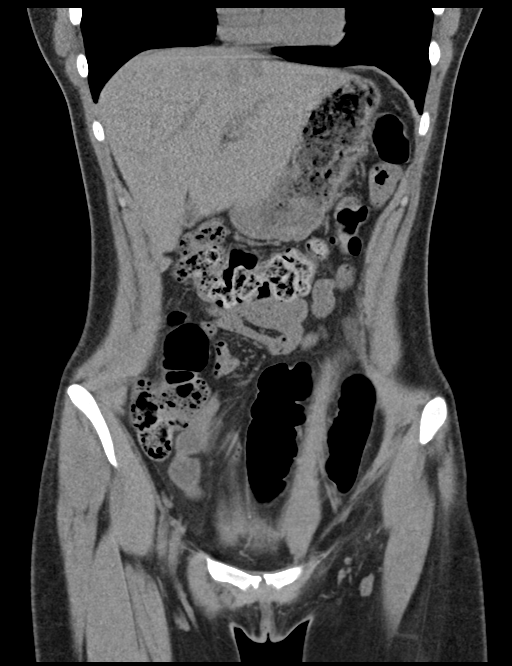
[im 57/129  soft-tissue]
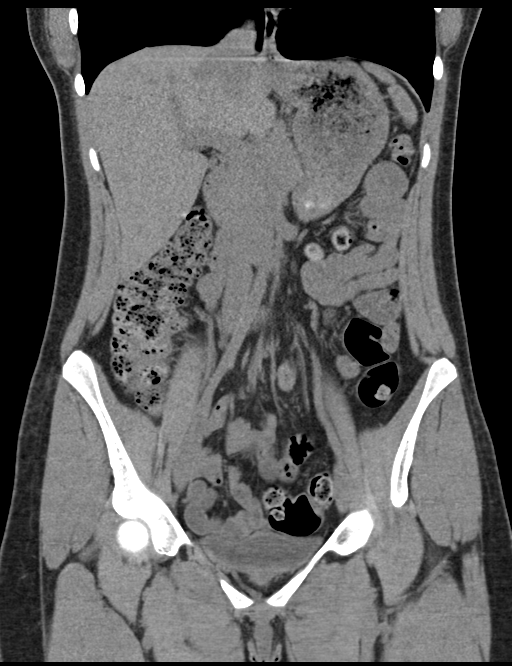
[im 72/129  soft-tissue]
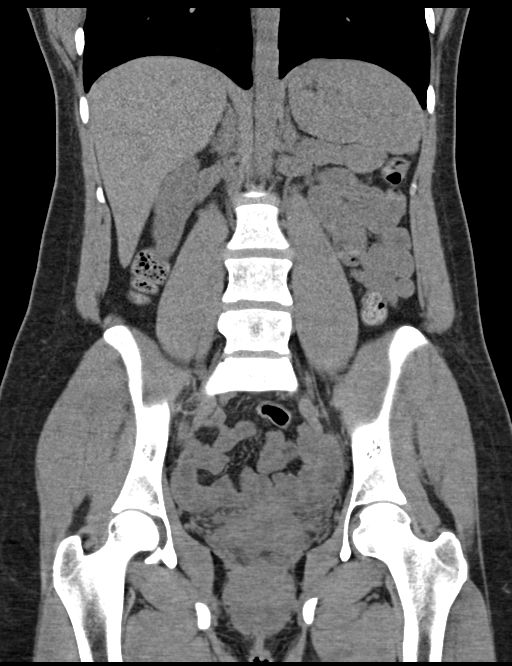

[16 of 46 positions shown; findings below may reference images not displayed]

FINDINGS: Lower chest: Visualized lung bases are clear.

Hepatobiliary: Limited noncontrast evaluation of the liver is
unremarkable. Gallbladder within normal limits. No biliary
dilatation.

Pancreas: Pancreas within normal limits.

Spleen: Unremarkable.

Adrenals/Urinary Tract: Adrenal glands are normal. Kidneys equal in
size without nephrolithiasis or hydronephrosis. No radiopaque
calculi seen along the course of either renal collecting system. No
appreciable hydroureter. Partially distended bladder within normal
limits. No layering stones within the bladder lumen.

Stomach/Bowel: Stomach within normal limits. No evidence for
obstruction. No acute inflammatory changes seen about the bowels.
Normal appendix.

Vascular/Lymphatic: Intra-abdominal aorta of normal caliber. No
adenopathy.

Reproductive: IUD in place within the uterus. Uterus and ovaries
otherwise unremarkable.

Other: No free air or fluid.

Musculoskeletal: No acute osseous abnormality. No discrete lytic or
blastic osseous lesions.
IMPRESSION: 1. No CT evidence for nephrolithiasis or obstructive uropathy.
2. No other acute intra-abdominal or pelvic process identified.

## 2019-02-02 LAB — URINE CULTURE: Culture: 20000 — AB

## 2019-02-03 ENCOUNTER — Telehealth: Payer: Self-pay | Admitting: Emergency Medicine

## 2019-02-03 NOTE — Telephone Encounter (Signed)
Post ED Visit - Positive Culture Follow-up  Culture report reviewed by antimicrobial stewardship pharmacist:  []  Enzo Bi, Pharm.D. []  Celedonio Miyamoto, Pharm.D., BCPS AQ-ID []  Garvin Fila, Pharm.D., BCPS []  Georgina Pillion, [address].D., BCPS []  Jovista, [address].D., BCPS, AAHIVP []  Estella Husk, Pharm.D., BCPS, AAHIVP []  Lysle Pearl, PharmD, BCPS []  Phillips Climes, PharmD, BCPS []  Agapito Games, PharmD, BCPS [x]  Vinnie Level, PharmD  Positive Urine culture Treated with Nitrofurantoin, organism sensitive to the same and no further patient follow-up is required at this time.  Carollee Herter Brenetta Penny 02/03/2019, 3:26 PM

## 2019-02-09 DIAGNOSIS — F411 Generalized anxiety disorder: Secondary | ICD-10-CM | POA: Diagnosis not present

## 2019-02-27 DIAGNOSIS — N390 Urinary tract infection, site not specified: Secondary | ICD-10-CM | POA: Diagnosis not present

## 2019-03-09 DIAGNOSIS — F411 Generalized anxiety disorder: Secondary | ICD-10-CM | POA: Diagnosis not present

## 2019-03-15 DIAGNOSIS — F411 Generalized anxiety disorder: Secondary | ICD-10-CM | POA: Diagnosis not present

## 2019-04-02 DIAGNOSIS — Z87440 Personal history of urinary (tract) infections: Secondary | ICD-10-CM | POA: Diagnosis not present

## 2019-04-02 DIAGNOSIS — R399 Unspecified symptoms and signs involving the genitourinary system: Secondary | ICD-10-CM | POA: Diagnosis not present

## 2019-04-09 DIAGNOSIS — F411 Generalized anxiety disorder: Secondary | ICD-10-CM | POA: Diagnosis not present

## 2019-04-16 DIAGNOSIS — F411 Generalized anxiety disorder: Secondary | ICD-10-CM | POA: Diagnosis not present

## 2019-04-23 DIAGNOSIS — F411 Generalized anxiety disorder: Secondary | ICD-10-CM | POA: Diagnosis not present

## 2019-04-30 DIAGNOSIS — F411 Generalized anxiety disorder: Secondary | ICD-10-CM | POA: Diagnosis not present

## 2019-05-07 DIAGNOSIS — F411 Generalized anxiety disorder: Secondary | ICD-10-CM | POA: Diagnosis not present

## 2019-05-15 DIAGNOSIS — F411 Generalized anxiety disorder: Secondary | ICD-10-CM | POA: Diagnosis not present

## 2019-07-01 DIAGNOSIS — Z20828 Contact with and (suspected) exposure to other viral communicable diseases: Secondary | ICD-10-CM | POA: Diagnosis not present

## 2019-08-28 DIAGNOSIS — Z03818 Encounter for observation for suspected exposure to other biological agents ruled out: Secondary | ICD-10-CM | POA: Diagnosis not present

## 2019-08-29 DIAGNOSIS — Z20828 Contact with and (suspected) exposure to other viral communicable diseases: Secondary | ICD-10-CM | POA: Diagnosis not present

## 2019-09-03 DIAGNOSIS — U071 COVID-19: Secondary | ICD-10-CM | POA: Diagnosis not present

## 2019-11-13 DIAGNOSIS — Z20828 Contact with and (suspected) exposure to other viral communicable diseases: Secondary | ICD-10-CM | POA: Diagnosis not present

## 2020-02-11 DIAGNOSIS — Z20828 Contact with and (suspected) exposure to other viral communicable diseases: Secondary | ICD-10-CM | POA: Diagnosis not present

## 2020-03-11 ENCOUNTER — Other Ambulatory Visit: Payer: Self-pay

## 2020-03-11 ENCOUNTER — Encounter (HOSPITAL_COMMUNITY): Payer: Self-pay | Admitting: Psychiatry

## 2020-03-11 ENCOUNTER — Inpatient Hospital Stay (HOSPITAL_COMMUNITY)
Admission: AD | Admit: 2020-03-11 | Discharge: 2020-03-14 | DRG: 885 | Disposition: A | Discharge status: Home / Self Care | Payer: BC Managed Care – PPO | Source: Ambulatory Visit | Attending: Psychiatry | Admitting: Psychiatry

## 2020-03-11 DIAGNOSIS — Z20822 Contact with and (suspected) exposure to covid-19: Secondary | ICD-10-CM | POA: Diagnosis not present

## 2020-03-11 DIAGNOSIS — F1290 Cannabis use, unspecified, uncomplicated: Secondary | ICD-10-CM | POA: Diagnosis not present

## 2020-03-11 DIAGNOSIS — R45851 Suicidal ideations: Secondary | ICD-10-CM | POA: Diagnosis present

## 2020-03-11 DIAGNOSIS — Z8349 Family history of other endocrine, nutritional and metabolic diseases: Secondary | ICD-10-CM | POA: Diagnosis not present

## 2020-03-11 DIAGNOSIS — F332 Major depressive disorder, recurrent severe without psychotic features: Secondary | ICD-10-CM | POA: Diagnosis not present

## 2020-03-11 DIAGNOSIS — Z818 Family history of other mental and behavioral disorders: Secondary | ICD-10-CM

## 2020-03-11 DIAGNOSIS — F17290 Nicotine dependence, other tobacco product, uncomplicated: Secondary | ICD-10-CM | POA: Diagnosis present

## 2020-03-11 DIAGNOSIS — Z880 Allergy status to penicillin: Secondary | ICD-10-CM

## 2020-03-11 LAB — RESPIRATORY PANEL BY RT PCR (FLU A&B, COVID)
Influenza A by PCR: NEGATIVE
Influenza B by PCR: NEGATIVE
SARS Coronavirus 2 by RT PCR: NEGATIVE

## 2020-03-11 MED ORDER — VENLAFAXINE HCL ER 75 MG PO CP24
112.5000 mg | ORAL_CAPSULE | Freq: Every day | ORAL | Status: DC
  Administered 2020-03-12: 112.5 mg via ORAL
  Filled 2020-03-11 (×2): qty 1

## 2020-03-11 MED ORDER — HYDROXYZINE HCL 10 MG PO TABS
10.0000 mg | ORAL_TABLET | Freq: Two times a day (BID) | ORAL | Status: DC | PRN
  Administered 2020-03-11 – 2020-03-13 (×3): 20 mg via ORAL
  Filled 2020-03-11 (×3): qty 2

## 2020-03-11 MED ORDER — PROPRANOLOL HCL 10 MG PO TABS
10.0000 mg | ORAL_TABLET | Freq: Three times a day (TID) | ORAL | Status: DC
  Administered 2020-03-11 – 2020-03-14 (×7): 10 mg via ORAL
  Filled 2020-03-11 (×14): qty 1

## 2020-03-11 NOTE — Progress Notes (Signed)
Patient ID: Amanda Fowler, female   DOB: November 23, 1996, 23 y.o.   MRN: [id number] Pt A&O x 4, no distress, resting at present, calm & cooperative.  Pt denies SI, HI or AVH at present.  Monitoring for safety.  Pending transfer to [address].

## 2020-03-11 NOTE — Progress Notes (Signed)
Patient ID: Amanda Fowler, female   DOB: 1996/10/31, 24 y.o.   MRN: [id number] D: Pt here voluntarily as walk-in to [facility]. Pt brought here by depressive symptoms and suicidal ideation without a plan. Pt is student at [employer] and identifies school concerns as her major stressor. Pt stopped her Effexor abruptly about 4-5 days ago. Pt doesn't endorse a reason for stopping even though pt knew the possible side effects of doing so. Pt denies current SI, HI, AVH or pain. Pt has a good support system and is agreeable to treatment. This is pt's first admission to a mental health facility. A: Pt was offered support and encouragement. Pt is cooperative during assessment. Skin search and admission process completed by day shift observation unit RN. Height and weight assessed and admission paperwork signed. Belongings searched and contraband items placed in locker. Pt introduced to unit milieu by nursing staff. Q 15 minute checks were started for safety.  R: Pt in room. Pt safety maintained on unit.

## 2020-03-11 NOTE — Progress Notes (Signed)
   03/11/20 2025  Psych Admission Type (Psych Patients Only)  Admission Status Voluntary  Psychosocial Assessment  Patient Complaints Depression  Eye Contact Fair  Facial Expression Sad  Affect Depressed  Speech Logical/coherent  Interaction Assertive  Motor Activity Other (Comment) (WNL)  Appearance/Hygiene Unremarkable;In scrubs  Behavior Characteristics Cooperative;Anxious  Mood Depressed;Anxious  Thought Process  Coherency WDL  Content WDL  Delusions WDL  Perception WDL  Hallucination None reported or observed  Judgment Impaired  Confusion None  Danger to Self  Current suicidal ideation? Denies  Agreement Not to Harm Self Yes  Description of Agreement Verbal  Danger to Others  Danger to Others None reported or observed
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
## 2020-03-11 NOTE — Tx Team (Signed)
Initial Treatment Plan 03/11/2020 9:58 PM Amanda Fowler MNO:[id number]    PATIENT STRESSORS: Educational concerns Medication change or noncompliance   PATIENT STRENGTHS: Average or above average intelligence Capable of independent living Motivation for treatment/growth Supportive family/friends   PATIENT IDENTIFIED PROBLEMS: Depression  Suicidal ideation  (wants to work on decreasing stress level and not becoming overwhelmed and shutting down)                 DISCHARGE CRITERIA:  Improved stabilization in mood, thinking, and/or behavior Motivation to continue treatment in a less acute level of care Verbal commitment to aftercare and medication compliance  PRELIMINARY DISCHARGE PLAN: Attend PHP/IOP Outpatient therapy Return to previous living arrangement Return to previous work or school arrangements  PATIENT/FAMILY INVOLVEMENT: This treatment plan has been presented to and reviewed with the patient, Amanda Fowler, and/or family member.  The patient and family have been given the opportunity to ask questions and make suggestions.  Amanda December, RN 03/11/2020, 9:58 PM

## 2020-03-11 NOTE — H&P (Signed)
Behavioral Health Medical Screening Exam  Amanda Fowler is an 24 y.o. female.  Patient presents voluntarily to [facility] behavioral health for walk-in assessment, referred by psychiatric provider at [facility].  Patient states "I want to be taking care of and to feel better." Patient reports suicidal ideation with vague plan.  Patient denies history of suicide attempt, denies history of self-harm.  Patient denies homicidal ideations.  Patient denies auditory and visual hallucinations.  Patient denies symptoms of paranoia. Patient currently a Consulting civil engineer at [employer], lives with 4 roommates.  Patient reports poor grades at this time.  Patient reports difficulty sleeping and appetite that "comes and goes."  Patient's parents live in [place].  Patient seen by outpatient psychiatrist, Emerson Monte.  Patient's home medications include Effexor propranolol and Adderall.  Patient reports marijuana use daily.  Patient denies alcohol use and other substance use. Patient reports she is currently sleeping 18 to 20 hours daily.  Patient reports stopped Effexor several weeks ago.  Patient reports symptoms of depression including anhedonia, decreased energy and feeling hopeless.  Total Time spent with patient: 30 minutes  Psychiatric Specialty Exam: Physical Exam  Nursing note and vitals reviewed. Constitutional: She is oriented to person, place, and time. She appears well-developed.  HENT:  Head: Normocephalic.  Cardiovascular: Normal rate.  Respiratory: Effort normal.  Neurological: She is alert and oriented to person, place, and time.  Psychiatric: Her speech is normal and behavior is normal. Judgment normal. Cognition and memory are normal. She exhibits a depressed mood. She expresses suicidal ideation.    Review of Systems  Constitutional: Negative.   HENT: Negative.   Eyes: Negative.   Respiratory: Negative.   Cardiovascular: Negative.   Gastrointestinal: Negative.   Genitourinary: Negative.    Musculoskeletal: Negative.   Skin: Negative.   Neurological: Negative.   Psychiatric/Behavioral: Positive for suicidal ideas.    There were no vitals taken for this visit.There is no height or weight on file to calculate BMI.  General Appearance: Disheveled  Eye Contact:  Fair  Speech:  Clear and Coherent and Normal Rate  Volume:  Decreased  Mood:  Depressed  Affect:  Depressed and Tearful  Thought Process:  Coherent, Goal Directed and Descriptions of Associations: Intact  Orientation:  Full (Time, Place, and Person)  Thought Content:  Logical  Suicidal Thoughts:  Yes.  without intent/plan  Homicidal Thoughts:  No  Memory:  Immediate;   Good Recent;   Good Remote;   Good  Judgement:  Fair  Insight:  Fair  Psychomotor Activity:  Normal  Concentration: Concentration: Good and Attention Span: Good  Recall:  Good  Fund of Knowledge:Good  Language: Good  Akathisia:  No  Handed:  Right  AIMS (if indicated):     Assets:  Communication Skills Desire for Improvement Financial Resources/Insurance Housing Intimacy Leisure Time Physical Health Resilience Social Support Talents/Skills Transportation  Sleep:       Musculoskeletal: Strength & Muscle Tone: within normal limits Gait & Station: normal Patient leans: N/A  There were no vitals taken for this visit.  Recommendations: Inpatient treatment recommended.   Based on my evaluation the patient does not appear to have an emergency medical condition.  Patrcia Dolly, FNP 03/11/2020, 4:28 PM

## 2020-03-12 DIAGNOSIS — F332 Major depressive disorder, recurrent severe without psychotic features: Principal | ICD-10-CM

## 2020-03-12 LAB — COMPREHENSIVE METABOLIC PANEL
ALT: 21 U/L (ref 0–44)
AST: 20 U/L (ref 15–41)
Albumin: 4.6 g/dL (ref 3.5–5.0)
Alkaline Phosphatase: 76 U/L (ref 38–126)
Anion gap: 8 (ref 5–15)
BUN: 9 mg/dL (ref 6–20)
CO2: 26 mmol/L (ref 22–32)
Calcium: 9.4 mg/dL (ref 8.9–10.3)
Chloride: 106 mmol/L (ref 98–111)
Creatinine, Ser: 0.9 mg/dL (ref 0.44–1.00)
GFR calc Af Amer: >60 mL/min (ref >60)
GFR calc non Af Amer: >60 mL/min (ref >60)
Glucose, Bld: 90 mg/dL (ref 70–99)
Potassium: 4.2 mmol/L (ref 3.5–5.1)
Sodium: 140 mmol/L (ref 135–145)
Total Bilirubin: 0.5 mg/dL (ref 0.3–1.2)
Total Protein: 7.9 g/dL (ref 6.5–8.1)

## 2020-03-12 LAB — LIPID PANEL
Cholesterol: 281 mg/dL — ABNORMAL HIGH (ref 0–200)
HDL: 50 mg/dL (ref >40)
LDL Cholesterol: 202 mg/dL — ABNORMAL HIGH (ref 0–99)
Total CHOL/HDL Ratio: 5.6 RATIO
Triglycerides: 147 mg/dL (ref <150)
VLDL: 29 mg/dL (ref 0–40)

## 2020-03-12 LAB — ETHANOL: Alcohol, Ethyl (B): <10 mg/dL (ref <10)

## 2020-03-12 LAB — CBC
HCT: 45.3 % (ref 36.0–46.0)
Hemoglobin: 14.7 g/dL (ref 12.0–15.0)
MCH: 31 pg (ref 26.0–34.0)
MCHC: 32.5 g/dL (ref 30.0–36.0)
MCV: 95.6 fL (ref 80.0–100.0)
Platelets: 340 10*3/uL (ref 150–400)
RBC: 4.74 MIL/uL (ref 3.87–5.11)
RDW: 12 % (ref 11.5–15.5)
WBC: 10.2 10*3/uL (ref 4.0–10.5)
nRBC: 0 % (ref 0.0–0.2)

## 2020-03-12 LAB — HEMOGLOBIN A1C
Hgb A1c MFr Bld: 5.2 % (ref 4.8–5.6)
Mean Plasma Glucose: 102.54 mg/dL

## 2020-03-12 LAB — TSH: TSH: 1.391 u[IU]/mL (ref 0.350–4.500)

## 2020-03-12 MED ORDER — ARIPIPRAZOLE 2 MG PO TABS
2.0000 mg | ORAL_TABLET | Freq: Every day | ORAL | Status: DC
  Administered 2020-03-12 – 2020-03-14 (×3): 2 mg via ORAL
  Filled 2020-03-12 (×6): qty 1

## 2020-03-12 MED ORDER — TRAZODONE HCL 50 MG PO TABS
50.0000 mg | ORAL_TABLET | Freq: Every evening | ORAL | Status: DC | PRN
  Administered 2020-03-12 – 2020-03-13 (×2): 50 mg via ORAL
  Filled 2020-03-12 (×2): qty 1

## 2020-03-12 MED ORDER — VENLAFAXINE HCL ER 150 MG PO CP24
150.0000 mg | ORAL_CAPSULE | Freq: Every day | ORAL | Status: DC
  Administered 2020-03-13 – 2020-03-14 (×2): 150 mg via ORAL
  Filled 2020-03-12 (×4): qty 1

## 2020-03-12 MED ORDER — NICOTINE 14 MG/24HR TD PT24
14.0000 mg | MEDICATED_PATCH | Freq: Every day | TRANSDERMAL | Status: DC
  Administered 2020-03-13 – 2020-03-14 (×2): 14 mg via TRANSDERMAL
  Filled 2020-03-12 (×4): qty 1

## 2020-03-12 NOTE — Progress Notes (Signed)
Recreation Therapy Notes  Date:  3.17.21 Time: 0930 Location: 300 Hall Dayroom  Group Topic: Stress Management  Goal Area(s) Addresses:  Patient will identify positive stress management techniques. Patient will identify benefits of using stress management post d/c.  Intervention: Stress Management  Activity : Guided Imagery.  LRT read a script that took patients on a journey through a walk in the forest.  Patients were to listen and follow along as LRT read script to engage in activity.  Education:  Stress Management, Discharge Planning.   Education Outcome: Acknowledges Education  Clinical Observations/Feedback:  Pt did not attend group activity .    Caroll Rancher, LRT/CTRS         Caroll Rancher A 03/12/2020 10:46 AM
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
## 2020-03-12 NOTE — Tx Team (Signed)
Interdisciplinary Treatment and Diagnostic Plan Update  03/12/2020 Time of Session: 9:30am Amanda Fowler MRN: [id number]  Principal Diagnosis: MDD (major depressive disorder), recurrent severe, without psychosis ([place])  Secondary Diagnoses: Principal Problem:   MDD (major depressive disorder), recurrent severe, without psychosis ([place])   Current Medications:  Current Facility-Administered Medications  Medication Dose Route Frequency Provider Last Rate Last Admin  . hydrOXYzine (ATARAX/VISTARIL) tablet 10-20 mg  10-20 mg Oral BID PRN Rankin, Shuvon B, NP   20 mg at 03/11/20 2133  . propranolol (INDERAL) tablet 10 mg  10 mg Oral TID Rankin, Shuvon B, NP   10 mg at 03/12/20 0828  . venlafaxine XR (EFFEXOR-XR) 24 hr capsule 112.5 mg  112.5 mg Oral Q breakfast Rankin, Shuvon B, NP   112.5 mg at 03/12/20 3220   PTA Medications: Medications Prior to Admission  Medication Sig Dispense Refill Last Dose  . propranolol (INDERAL) 10 MG tablet Take 10 mg by mouth daily.     Marland Kitchen amphetamine-dextroamphetamine (ADDERALL) 10 MG tablet Take 10 mg by mouth daily as needed (ADHD).      Marland Kitchen levonorgestrel (MIRENA) 20 MCG/24HR IUD 1 each by Intrauterine route once.     . venlafaxine XR (EFFEXOR-XR) 150 MG 24 hr capsule Take 150 mg by mouth daily.     Marland Kitchen VYVANSE 30 MG capsule Take 30 mg by mouth every morning.  0     Patient Stressors: Educational concerns Medication change or noncompliance  Patient Strengths: Average or above average intelligence Capable of independent living Motivation for treatment/growth Supportive family/friends  Treatment Modalities: Medication Management, Group therapy, Case management,  1 to 1 session with clinician, Psychoeducation, Recreational therapy.   Physician Treatment Plan for Primary Diagnosis: MDD (major depressive disorder), recurrent severe, without psychosis ([place]) Long Term Goal(s): Improvement in symptoms so as ready for discharge Improvement in symptoms so  as ready for discharge   Short Term Goals: Ability to identify changes in lifestyle to reduce recurrence of condition will improve Ability to disclose and discuss suicidal ideas Ability to demonstrate self-control will improve Ability to demonstrate self-control will improve Ability to identify and develop effective coping behaviors will improve Compliance with prescribed medications will improve  Medication Management: Evaluate patient's response, side effects, and tolerance of medication regimen.  Therapeutic Interventions: 1 to 1 sessions, Unit Group sessions and Medication administration.  Evaluation of Outcomes: Not Met  Physician Treatment Plan for Secondary Diagnosis: Principal Problem:   MDD (major depressive disorder), recurrent severe, without psychosis ([place])  Long Term Goal(s): Improvement in symptoms so as ready for discharge Improvement in symptoms so as ready for discharge   Short Term Goals: Ability to identify changes in lifestyle to reduce recurrence of condition will improve Ability to disclose and discuss suicidal ideas Ability to demonstrate self-control will improve Ability to demonstrate self-control will improve Ability to identify and develop effective coping behaviors will improve Compliance with prescribed medications will improve     Medication Management: Evaluate patient's response, side effects, and tolerance of medication regimen.  Therapeutic Interventions: 1 to 1 sessions, Unit Group sessions and Medication administration.  Evaluation of Outcomes: Not Met   RN Treatment Plan for Primary Diagnosis: MDD (major depressive disorder), recurrent severe, without psychosis ([place]) Long Term Goal(s): Knowledge of disease and therapeutic regimen to maintain health will improve  Short Term Goals: Ability to verbalize feelings will improve, Ability to disclose and discuss suicidal ideas, Ability to identify and develop effective coping behaviors will improve  and Compliance with prescribed medications will  improve  Medication Management: RN will administer medications as ordered by provider, will assess and evaluate patient's response and provide education to patient for prescribed medication. RN will report any adverse and/or side effects to prescribing provider.  Therapeutic Interventions: 1 on 1 counseling sessions, Psychoeducation, Medication administration, Evaluate responses to treatment, Monitor vital signs and CBGs as ordered, Perform/monitor CIWA, COWS, AIMS and Fall Risk screenings as ordered, Perform wound care treatments as ordered.  Evaluation of Outcomes: Not Met   LCSW Treatment Plan for Primary Diagnosis: MDD (major depressive disorder), recurrent severe, without psychosis ([place]) Long Term Goal(s): Safe transition to appropriate next level of care at discharge, Engage patient in therapeutic group addressing interpersonal concerns.  Short Term Goals: Engage patient in aftercare planning with referrals and resources  Therapeutic Interventions: Assess for all discharge needs, 1 to 1 time with Social worker, Explore available resources and support systems, Assess for adequacy in community support network, Educate family and significant other(s) on suicide prevention, Complete Psychosocial Assessment, Interpersonal group therapy.  Evaluation of Outcomes: Not Met   Progress in Treatment: Attending groups: No. New to unit Participating in groups: No. Taking medication as prescribed: Yes. Toleration medication: Yes. Family/Significant other contact made: No, will contact:  if patient consents to collateral contacts Patient understands diagnosis: Yes. Discussing patient identified problems/goals with staff: Yes. Medical problems stabilized or resolved: Yes. Denies suicidal/homicidal ideation: Yes. Issues/concerns per patient self-inventory: No. Other:   New problem(s) identified: None   New Short Term/Long Term Goal(s):medication  stabilization, elimination of SI thoughts, development of comprehensive mental wellness plan.    Patient Goals: "I was in a dark space and I came here to get away from that"    Discharge Plan or Barriers: Patient recently admitted. CSW will continue to follow and assess for appropriate referrals and possible discharge planning.    Reason for Continuation of Hospitalization: Anxiety Depression Medication stabilization Suicidal ideation  Estimated Length of Stay: 3-5 days   Attendees: Patient: Amanda Fowler 03/12/2020 9:58 AM  Physician: Dr. Neita Garnet, MD 03/12/2020 9:58 AM  Nursing:  03/12/2020 9:58 AM  RN Care Manager: 03/12/2020 9:58 AM  Social Worker: Radonna Ricker, LCSW 03/12/2020 9:58 AM  Recreational Therapist:  03/12/2020 9:58 AM  Other:  03/12/2020 9:58 AM  Other:  03/12/2020 9:58 AM  Other: 03/12/2020 9:58 AM    Scribe for Treatment Team: Marylee Floras, [place] 03/12/2020 9:58 AM

## 2020-03-12 NOTE — BHH Group Notes (Signed)
LCSW Group Therapy Note  03/12/2020 1:30 PM  Type of Therapy/Topic:  Group Therapy:  Emotion Regulation  Participation Level:  Did Not Attend   Description of Group:   The purpose of this group is to assist patients in learning to regulate negative emotions and experience positive emotions. Patients will be guided to discuss ways in which they have been vulnerable to their negative emotions. These vulnerabilities will be juxtaposed with experiences of positive emotions or situations, and patients will be challenged to use positive emotions to combat negative ones. Special emphasis will be placed on coping with negative emotions in conflict situations, and patients will process healthy conflict resolution skills.  Therapeutic Goals: 1. Patient will identify two positive emotions or experiences to reflect on in order to balance out negative emotions 2. Patient will label two or more emotions that they find the most difficult to experience 3. Patient will demonstrate positive conflict resolution skills through discussion and/or role plays  Summary of Patient Progress: X  Therapeutic Modalities:   Cognitive Behavioral Therapy Feelings Identification Dialectical Behavioral Therapy  Elimelech Houseman, MSW, LCSW 03/12/2020 1:16 PM  

## 2020-03-12 NOTE — Progress Notes (Signed)
   03/12/20 1945  Psych Admission Type (Psych Patients Only)  Admission Status Voluntary  Psychosocial Assessment  Patient Complaints Depression;Other (Comment) (some nicotine withdrawals)  Eye Contact Fair  Facial Expression Sad;Anxious  Affect Depressed;Sad  Speech Logical/coherent  Interaction Assertive  Motor Activity Slow  Appearance/Hygiene Unremarkable;In scrubs  Behavior Characteristics Cooperative;Anxious  Mood Depressed;Sad;Pleasant  Thought Process  Coherency WDL  Content WDL  Delusions None reported or observed  Perception WDL  Hallucination None reported or observed  Judgment Poor  Confusion None  Danger to Self  Current suicidal ideation? Denies  Agreement Not to Harm Self Yes  Description of Agreement Verbal  Danger to Others  Danger to Others None reported or observed   Pt feeling better today. In dayroom doing a Sudoku puzzle. Contracts for safety. Only complaint is some nicotine withdrawals - patch ordered.
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
## 2020-03-12 NOTE — Plan of Care (Signed)
Progress note  D: pt found in bed; compliant with medication administration. Pt denies any physical complaints or pain. Pt did endorse feelings of being tired. Pt still expresses sadness revolving around school, covid, and being a senior during this pandemic. Pt is pleasant. Pt denies si/hi/ah/vh and verbally agrees to approach staff if these become apparent or before harming themself/others while at [facility].  A: Pt provided support and encouragement. Pt given medication per protocol and standing orders. Q67m safety checks implemented and continued.  R: Pt safe on the unit. Will continue to monitor.  Pt progressing in the following metrics  Problem: Education: Goal: Knowledge of  General Education information/materials will improve Outcome: Progressing Goal: Emotional status will improve Outcome: Progressing Goal: Mental status will improve Outcome: Progressing Goal: Verbalization of understanding the information provided will improve Outcome: Progressing

## 2020-03-12 NOTE — Progress Notes (Signed)
[facility] Group Notes:  (Nursing/MHT/Case Management/Adjunct)  Date:  03/12/2020  Time:  2030 Type of Therapy:  wrap up group  Participation Level:  Active  Participation Quality:  Appropriate, Attentive, Sharing and Supportive  Affect:  Appropriate  Cognitive:  Appropriate  Insight:  Improving  Engagement in Group:  Engaged  Modes of Intervention:  Clarification, Education and Support  Summary of Progress/Problems: Positive thinking and positive change were discussed.   Marcille Buffy 03/12/2020, 9:52 PM
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
## 2020-03-13 DIAGNOSIS — F332 Major depressive disorder, recurrent severe without psychotic features: Secondary | ICD-10-CM | POA: Diagnosis not present

## 2020-03-13 NOTE — Progress Notes (Signed)
   03/13/20 2219  Psych Admission Type (Psych Patients Only)  Admission Status Voluntary  Psychosocial Assessment  Patient Complaints None  Eye Contact Fair  Facial Expression Animated  Affect Appropriate to circumstance  Speech Logical/coherent  Interaction Assertive  Motor Activity Slow  Appearance/Hygiene Unremarkable  Behavior Characteristics Cooperative  Mood Pleasant  Thought Process  Coherency WDL  Content WDL  Delusions WDL  Perception WDL  Hallucination None reported or observed  Judgment WDL  Confusion None  Danger to Self  Current suicidal ideation? Denies  Self-Injurious Behavior No self-injurious ideation or behavior indicators observed or expressed   Agreement Not to Harm Self Yes  Description of Agreement Verbal  Danger to Others  Danger to Others None reported or observed  D: Patient in dayroom reports she had a good day.  A: Medications administered as prescribed. Support and encouragement provided as needed.  R: Patient remains safe on the unit. Will continue to monitor for safety and stability.

## 2020-03-13 NOTE — [facility] Group Notes (Signed)
Pt attended Zoom meeting, via ipad.  

## 2020-03-13 NOTE — [facility] Suicide Risk Assessment (Signed)
[facility] INPATIENT:  Family/Significant Other Suicide Prevention Education  Suicide Prevention Education:  Education Completed; Claudette Wermuth, mother, [phone], has been identified by the patient as the family member/significant other with whom the patient will be residing, and identified as the person(s) who will aid the patient in the event of a mental health crisis (suicidal ideations/suicide attempt).  With written consent from the patient, the family member/significant other has been provided the following suicide prevention education, prior to the and/or following the discharge of the patient.  The suicide prevention education provided includes the following:  Suicide risk factors  Suicide prevention and interventions  National Suicide Hotline telephone number  [facility] assessment telephone number  [facility] Emergency Assistance 911  [facility] and/or Residential Mobile Crisis Unit telephone number  Request made of family/significant other to:  Remove weapons (e.g., guns, rifles, knives), all items previously/currently identified as safety concern.    Remove drugs/medications (over-the-counter, prescriptions, illicit drugs), all items previously/currently identified as a safety concern.  The family member/significant other verbalizes understanding of the suicide prevention education information provided.  The family member/significant other agrees to remove the items of safety concern listed above.  CSW spoke with the patient's mother.  Mother reports that patient "is a perfectionist but sometimes she just gives up". She reports that "she is fighting demons and neither she nor I know what they are".  Mother reports that she does not have concerns of patient being a danger to herself or others, but she remains apprehensive.  She reports that the patient does not have access to weapons in the home, however, may have access in the sorority house as mother is not  able to monitor that home.  She reports that patient has a history of engaging in manic behaviors including shopping sprees.       Harden Mo 03/13/2020, 2:30 PM

## 2020-03-13 NOTE — [facility] Suicide Risk Assessment (Signed)
[facility] INPATIENT:  Family/Significant Other Suicide Prevention Education  Suicide Prevention Education:  Contact Attempts: Dynasti Kerman, mother, [phone] has been identified by the patient as the family member/significant other with whom the patient will be residing, and identified as the person(s) who will aid the patient in the event of a mental health crisis.  With written consent from the patient, two attempts were made to provide suicide prevention education, prior to and/or following the patient's discharge.  We were unsuccessful in providing suicide prevention education.  A suicide education pamphlet was given to the patient to share with family/significant other.  Date and time of first attempt: 03/13/2020 at 1:19PM Date and time of second attempt: Second attempt is needed  CSW notes that HIPAA compliant voicemail was left.   Harden Mo 03/13/2020, 1:18 PM

## 2020-03-13 NOTE — Plan of Care (Signed)
Pt progressing in the following metrics  Problem: Activity: Goal: Interest or engagement in activities will improve Outcome: Progressing Goal: Sleeping patterns will improve Outcome: Progressing   Problem: Coping: Goal: Ability to verbalize frustrations and anger appropriately will improve Outcome: Progressing

## 2020-03-13 NOTE — Progress Notes (Signed)
[facility] MD Progress Note  03/13/2020 11:00 AM Amanda Fowler  MRN:  [id number] Subjective: Reports she is feeling better.  Currently denies suicidal or self-injurious ideations.  Denies medication side effects. Objective: I discussed case with treatment team and met with patient. 24 year old single female, college senior, presented on 3/16 reporting worsening depression, neurovegetative symptoms, suicidal ideations, increased anxiety/anxious ruminations.  She reports a history of depression and anxiety for which she has been prescribed Effexor XR propranolol.  She has been off meds for several days.  No prior psychiatric admissions, no history of suicidal attempts.  Today patient presents alert, attentive, calm, pleasant on approach.  Mood is reported as improving and affect is noted to be more reactive/full in range.  Currently denies suicidal ideations.  She is now back on Effexor XR, which she has been taking but had stopped a few days prior to admission.  She states Effexor XR has generally been well-tolerated and helpful.  Denies side effects at this time. Denies suicidal ideations. Behavior on unit is in good control-no psychomotor agitation.  She has been going to groups.  Principal Problem: MDD (major depressive disorder), recurrent severe, without psychosis ([place]) Diagnosis: Principal Problem:   MDD (major depressive disorder), recurrent severe, without psychosis ([place])  Total Time spent with patient: 15 minutes  Past Psychiatric History:   Past Medical History:  Past Medical History:  Diagnosis Date  . ADHD   . Anxiety   . Depression   . Heart murmur   . Migraines    with aura    Past Surgical History:  Procedure Laterality Date  . BIOPSY THYROID     benign   Family History:  Family History  Problem Relation Age of Onset  . Thyroid disease Mother        hypo  . Aneurysm Maternal Aunt   . Aneurysm Maternal Grandmother    Family Psychiatric  History:  Social  History:  Social History   Substance and Sexual Activity  Alcohol Use Yes  . Alcohol/week: 1.0 standard drinks  . Types: 1 Standard drinks or equivalent per week     Social History   Substance and Sexual Activity  Drug Use Yes  . Types: Marijuana    Social History   Socioeconomic History  . Marital status: Single    Spouse name: Not on file  . Number of children: Not on file  . Years of education: Not on file  . Highest education level: Not on file  Occupational History  . Not on file  Tobacco Use  . Smoking status: Current Some Day Smoker    Types: E-cigarettes  . Smokeless tobacco: Never Used  Substance and Sexual Activity  . Alcohol use: Yes    Alcohol/week: 1.0 standard drinks    Types: 1 Standard drinks or equivalent per week  . Drug use: Yes    Types: Marijuana  . Sexual activity: Yes    Partners: Male    Birth control/protection: I.U.D.    Comment: Mirena 01/2018  Other Topics Concern  . Not on file  Social History Narrative   Pt is a Development worker, community with 4 roommates. Pt has a history of depression and stopped her Effexor abruptly 4-5 days ago for no reason knowing the negative side effects of this action. Pt identifies her stressor as school related.   Social Determinants of Health   Financial Resource Strain:   . Difficulty of Paying Living Expenses:   Food Insecurity:   . Worried About Estate manager/land agent  of Food in the Last Year:   . [place] in the Last Year:   Transportation Needs:   . Lack of Transportation (Medical):   Marland Kitchen Lack of Transportation (Non-Medical):   Physical Activity:   . Days of Exercise per Week:   . Minutes of Exercise per Session:   Stress:   . Feeling of Stress :   Social Connections:   . Frequency of Communication with Friends and Family:   . Frequency of Social Gatherings with Friends and Family:   . Attends Religious Services:   . Active Member of Clubs or Organizations:   . Attends Archivist Meetings:   Marland Kitchen Marital  Status:    Additional Social History:    Pain Medications: None reported Prescriptions: Venlafaxine, propranolol, adderall Over the Counter: none reported History of alcohol / drug use?: Yes Name of Substance 1: THC 1 - Frequency: daily  Sleep: Improved (had reported hypersomnia prior to admission)  Appetite:  Improved  Current Medications: Current Facility-Administered Medications  Medication Dose Route Frequency Provider Last Rate Last Admin  . ARIPiprazole (ABILIFY) tablet 2 mg  2 mg Oral Daily Radames Mejorado, Myer Peer, MD   2 mg at 03/13/20 0758  . hydrOXYzine (ATARAX/VISTARIL) tablet 10-20 mg  10-20 mg Oral BID PRN Rankin, Shuvon B, NP   20 mg at 03/12/20 2124  . nicotine (NICODERM CQ - dosed in mg/24 hours) patch 14 mg  14 mg Transdermal Daily Maple Odaniel, Myer Peer, MD   14 mg at 03/13/20 0758  . propranolol (INDERAL) tablet 10 mg  10 mg Oral TID Rankin, Shuvon B, NP   Stopped at 03/13/20 0800  . traZODone (DESYREL) tablet 50 mg  50 mg Oral QHS PRN Anike, Adaku C, NP   50 mg at 03/12/20 2124  . venlafaxine XR (EFFEXOR-XR) 24 hr capsule 150 mg  150 mg Oral Q breakfast Tennelle Taflinger, Myer Peer, MD   150 mg at 03/13/20 1517    Lab Results:  Results for orders placed or performed during the hospital encounter of 03/11/20 (from the past 48 hour(s))  Respiratory Panel by RT PCR (Flu A&B, Covid) - Nasopharyngeal Swab     Status: None   Collection Time: 03/11/20  4:29 PM   Specimen: Nasopharyngeal Swab  Result Value Ref Range   SARS Coronavirus 2 by RT PCR NEGATIVE NEGATIVE    Comment: (NOTE) SARS-CoV-2 target nucleic acids are NOT DETECTED. The SARS-CoV-2 RNA is generally detectable in upper respiratoy specimens during the acute phase of infection. The lowest concentration of SARS-CoV-2 viral copies this assay can detect is 131 copies/mL. A negative result does not preclude SARS-Cov-2 infection and should not be used as the sole basis for treatment or other patient management decisions. A  negative result may occur with  improper specimen collection/handling, submission of specimen other than nasopharyngeal swab, presence of viral mutation(s) within the areas targeted by this assay, and inadequate number of viral copies (<131 copies/mL). A negative result must be combined with clinical observations, patient history, and epidemiological information. The expected result is Negative. Fact Sheet for Patients:  PinkCheek.be Fact Sheet for Healthcare Providers:  GravelBags.it This test is not yet ap proved or cleared by the [place] FDA and  has been authorized for detection and/or diagnosis of SARS-CoV-2 by FDA under an Emergency Use Authorization (EUA). This EUA will remain  in effect (meaning this test can be used) for the duration of the COVID-19 declaration under Section 564(b)(1) of the Act, 21 U.S.C.  section 360bbb-3(b)(1), unless the authorization is terminated or revoked sooner.    Influenza A by PCR NEGATIVE NEGATIVE   Influenza B by PCR NEGATIVE NEGATIVE    Comment: (NOTE) The Xpert Xpress SARS-CoV-2/FLU/RSV assay is intended as an aid in  the diagnosis of influenza from Nasopharyngeal swab specimens and  should not be used as a sole basis for treatment. Nasal washings and  aspirates are unacceptable for Xpert Xpress SARS-CoV-2/FLU/RSV  testing. Fact Sheet for Patients: PinkCheek.be Fact Sheet for Healthcare Providers: GravelBags.it This test is not yet approved or cleared by the [place] FDA and  has been authorized for detection and/or diagnosis of SARS-CoV-2 by  FDA under an Emergency Use Authorization (EUA). This EUA will remain  in effect (meaning this test can be used) for the duration of the  Covid-19 declaration under Section 564(b)(1) of the Act, 21  U.S.C. section 360bbb-3(b)(1), unless the authorization is  terminated or  revoked. Performed at [facility], [place] [address], [place], [place] [address]   CBC     Status: None   Collection Time: 03/12/20  6:43 AM  Result Value Ref Range   WBC 10.2 4.0 - 10.5 K/uL   RBC 4.74 3.87 - 5.11 MIL/uL   Hemoglobin 14.7 12.0 - 15.0 g/dL   HCT 45.3 36.0 - 46.0 %   MCV 95.6 80.0 - 100.0 fL   MCH 31.0 26.0 - 34.0 pg   MCHC 32.5 30.0 - 36.0 g/dL   RDW 12.0 11.5 - 15.5 %   Platelets 340 150 - 400 K/uL   nRBC 0.0 0.0 - 0.2 %    Comment: Performed at [facility], [place] [address] [place], [place] [address]  Comprehensive metabolic panel     Status: None   Collection Time: 03/12/20  6:43 AM  Result Value Ref Range   Sodium 140 135 - 145 mmol/L   Potassium 4.2 3.5 - 5.1 mmol/L   Chloride 106 98 - 111 mmol/L   CO2 26 22 - 32 mmol/L   Glucose, Bld 90 70 - 99 mg/dL    Comment: Glucose reference range applies only to samples taken after fasting for at least 8 hours.   BUN 9 6 - 20 mg/dL   Creatinine, Ser 0.90 0.44 - 1.00 mg/dL   Calcium 9.4 8.9 - 10.3 mg/dL   Total Protein 7.9 6.5 - 8.1 g/dL   Albumin 4.6 3.5 - 5.0 g/dL   AST 20 15 - 41 U/L   ALT 21 0 - 44 U/L   Alkaline Phosphatase 76 38 - 126 U/L   Total Bilirubin 0.5 0.3 - 1.2 mg/dL   GFR calc non Af Amer >60 >60 mL/min   GFR calc Af Amer >60 >60 mL/min   Anion gap 8 5 - 15    Comment: Performed at [facility], [place] [address], [place], [place] [address]  Hemoglobin A1c     Status: None   Collection Time: 03/12/20  6:43 AM  Result Value Ref Range   Hgb A1c MFr Bld 5.2 4.8 - 5.6 %    Comment: (NOTE) Pre diabetes:          5.7%-6.4% Diabetes:              >6.4% Glycemic control for   <7.0% adults with diabetes    Mean Plasma Glucose 102.54 mg/dL    Comment: Performed at [place] [address], [place], [place] [address]  Ethanol  Status: None   Collection Time: 03/12/20  6:43 AM  Result Value Ref Range   Alcohol, Ethyl (B) <10 <10  mg/dL    Comment: (NOTE) Lowest detectable limit for serum alcohol is 10 mg/dL. For medical purposes only. Performed at [facility], [place] [address], [place], [place] [address]   Lipid panel     Status: Abnormal   Collection Time: 03/12/20  6:43 AM  Result Value Ref Range   Cholesterol 281 (H) 0 - 200 mg/dL   Triglycerides 147 <150 mg/dL   HDL 50 >40 mg/dL   Total CHOL/HDL Ratio 5.6 RATIO   VLDL 29 0 - 40 mg/dL   LDL Cholesterol 202 (H) 0 - 99 mg/dL    Comment:        Total Cholesterol/HDL:CHD Risk Coronary Heart Disease Risk Table                     Men   Women  1/2 Average Risk   3.4   3.3  Average Risk       5.0   4.4  2 X Average Risk   9.6   7.1  3 X Average Risk  23.4   11.0        Use the calculated Patient Ratio above and the CHD Risk Table to determine the patient's CHD Risk.        ATP III CLASSIFICATION (LDL):  <100     mg/dL   Optimal  100-129  mg/dL   Near or Above                    Optimal  130-159  mg/dL   Borderline  160-189  mg/dL   High  >190     mg/dL   Very High Performed at [facility] [address], [place], [place] [address]   TSH     Status: None   Collection Time: 03/12/20  6:43 AM  Result Value Ref Range   TSH 1.391 0.350 - 4.500 uIU/mL    Comment: Performed by a 3rd Generation assay with a functional sensitivity of <=0.01 uIU/mL. Performed at [facility], [place] [address], [place],  [address]     Blood Alcohol level:  Lab Results  Component Value Date   ETH <10 89/38/1017    Metabolic Disorder Labs: Lab Results  Component Value Date   HGBA1C 5.2 03/12/2020   MPG 102.54 03/12/2020   No results found for: PROLACTIN Lab Results  Component Value Date   CHOL 281 (H) 03/12/2020   TRIG 147 03/12/2020   HDL 50 03/12/2020   CHOLHDL 5.6 03/12/2020   VLDL 29 03/12/2020   LDLCALC 202 (H) 03/12/2020   LDLCALC 122 (H) 08/09/2018    Physical Findings: AIMS: Facial  and Oral Movements Muscles of Facial Expression: None, normal Lips and Perioral Area: None, normal Jaw: None, normal Tongue: None, normal,Extremity Movements Upper (arms, wrists, hands, fingers): None, normal Lower (legs, knees, ankles, toes): None, normal, Trunk Movements Neck, shoulders, hips: None, normal, Overall Severity Severity of abnormal movements (highest score from questions above): None, normal Incapacitation due to abnormal movements: None, normal Patient's awareness of abnormal movements (rate only patient's report): No Awareness, Dental Status Current problems with teeth and/or dentures?: No Does patient usually wear dentures?: No  CIWA:  CIWA-Ar Total: 0 COWS:  COWS Total Score: 2  Musculoskeletal: Strength & Muscle Tone: within normal limits Gait & Station: normal Patient leans: N/A  Psychiatric Specialty Exam: Physical Exam  Review of Systems denies dizziness or lightheadedness, no chest pain or shortness of breath, no nausea, no vomiting  Blood pressure (!) 84/71, pulse (!) 101, temperature 99 F (37.2 C), temperature source Oral, resp. rate 16, height '5\' 8"'$  (1.727 m), weight 83.5 kg, SpO2 99 %.Body mass index is 27.98 kg/m.  General Appearance: Casual  Eye Contact:  Good  Speech:  Normal Rate  Volume:  Normal  Mood:  Reports improving mood  Affect:  More reactive, fuller in range  Thought Process:  Linear and Descriptions of Associations: Intact  Orientation:  Other:  Fully alert and attentive  Thought Content:  No hallucinations, no delusions, not internally preoccupied  Suicidal Thoughts:  No denies suicidal or self-injurious ideations, denies homicidal or violent ideations  Homicidal Thoughts:  No  Memory:  Recent and remote grossly intact  Judgement:  Other:  Improving  Insight:  Improving  Psychomotor Activity:  Normal-no restlessness or agitation  Concentration:  Concentration: Good and Attention Span: Good  Recall:  Good  Fund of Knowledge:  Good   Language:  Good  Akathisia:  Negative  Handed:  Right  AIMS (if indicated):     Assets:  Communication Skills Desire for Improvement Resilience  ADL's:  Intact  Cognition:  WNL  Sleep:  Number of Hours: 6   Assessment:  24 year old single female, college senior, presented on 3/16 reporting worsening depression, neurovegetative symptoms, suicidal ideations, increased anxiety/anxious ruminations.  She reports a history of depression and anxiety for which she has been prescribed Effexor XR propranolol.  She has been off meds for several days.  No prior psychiatric admissions, no history of suicidal attempts.  Currently she reports feeling better than she did on admission and presents with an improving/fuller range of affect/less anxiety.  Tolerating Effexor XR and low-dose Abilify for augmentation well thus far.  Denies side effects.  No SI/future oriented at this time.  Treatment Plan Summary: Daily contact with patient to assess and evaluate symptoms and progress in treatment, Medication management, Plan Inpatient treatment and Medications as below Encourage group and milieu participation Continue Effexor XR 150 mg daily for depression/anxiety Continue Abilify 2 mg daily for antidepressant augmentation/mood Continue Inderal 10 mg 3 times daily for anxiety Continue Trazodone 50 mg nightly as needed for insomnia as needed Continue nicotine transdermal patch for nicotine cravings Treatment team working on disposition planning options Jenne Campus, MD 03/13/2020, 11:00 AM

## 2020-03-13 NOTE — Progress Notes (Signed)
Progress note  D: pt found in bed; compliant with medication administration. Pt denies any physical complaints or pain. Pt is brighter today and more interactive. Pt seems more focused towards their discharge goal. Pt denies si/hi/ah/vh and verbally agrees to approach staff if these become apparent or before harming themself/others while at [facility].  A: Pt provided support and encouragement. Pt given medication per protocol and standing orders. Q61m safety checks implemented and continued.  R: Pt safe on the unit. Will continue to monitor.

## 2020-03-14 MED ORDER — VENLAFAXINE HCL ER 150 MG PO CP24: 150.0000 mg | ORAL_CAPSULE | Freq: Every day | ORAL | 0 refills | Status: DC

## 2020-03-14 MED ORDER — NICOTINE 14 MG/24HR TD PT24: 14.0000 mg | MEDICATED_PATCH | Freq: Every day | TRANSDERMAL | 0 refills | Status: DC

## 2020-03-14 MED ORDER — ARIPIPRAZOLE 2 MG PO TABS: 2.0000 mg | ORAL_TABLET | Freq: Every day | ORAL | 0 refills | Status: DC

## 2020-03-14 MED ORDER — TRAZODONE HCL 50 MG PO TABS: 50.0000 mg | ORAL_TABLET | Freq: Every evening | ORAL | 0 refills | Status: AC | PRN

## 2020-03-14 MED ORDER — PROPRANOLOL HCL 10 MG PO TABS: 10.0000 mg | ORAL_TABLET | Freq: Three times a day (TID) | ORAL | 1 refills | Status: DC

## 2020-03-14 NOTE — Discharge Summary (Addendum)
Physician Discharge Summary Note  Patient:  Amanda Fowler is an 24 y.o., female  MRN:  [id number]  DOB:  03/18/1996  Patient phone:  [phone] (home)   Patient [place]   [address] [place] [id number],   Total Time spent with patient:  Greater than 30 minutes  Date of Admission:  03/11/2020  Date of Discharge: 03-14-20  Reason for Admission:  Amanda Fowler is a 24 y.o female who was admitted to the behavioral health hospital after voluntarily presenting for an assessment due to worsening depression and suicidal ideations with thoughts of overdosing or an intentional car accident.   Principal Problem: MDD (major depressive disorder), recurrent severe, without psychosis (HCC)  Discharge Diagnoses: Principal Problem:   MDD (major depressive disorder), recurrent severe, without psychosis (HCC)  Past Psychiatric History: Major depressive disorder, recurrent episodes.  Past Medical History:  Past Medical History:  Diagnosis Date   ADHD    Anxiety    Depression    Heart murmur    Migraines    with aura    Past Surgical History:  Procedure Laterality Date   BIOPSY THYROID     benign   Family History:  Family History  Problem Relation Age of Onset   Thyroid disease Mother        hypo   Aneurysm Maternal Aunt    Aneurysm Maternal Grandmother    Family Psychiatric  History: See H&P  Social History:  Social History   Substance and Sexual Activity  Alcohol Use Yes   Alcohol/week: 1.0 standard drinks   Types: 1 Standard drinks or equivalent per week     Social History   Substance and Sexual Activity  Drug Use Yes   Types: Marijuana    Social History   Socioeconomic History   Marital status: Single    Spouse name: Not on file   Number of children: Not on file   Years of education: Not on file   Highest education level: Not on file  Occupational History   Not on file  Tobacco Use   Smoking status: Current Some Day Smoker     Types: E-cigarettes   Smokeless tobacco: Never Used  Substance and Sexual Activity   Alcohol use: Yes    Alcohol/week: 1.0 standard drinks    Types: 1 Standard drinks or equivalent per week   Drug use: Yes    Types: Marijuana   Sexual activity: Yes    Partners: Male    Birth control/protection: I.U.D.    Comment: Mirena 01/2018  Other Topics Concern   Not on file  Social History Narrative   Pt is a [employer] student with 4 roommates. Pt has a history of depression and stopped her Effexor abruptly 4-5 days ago for no reason knowing the negative side effects of this action. Pt identifies her stressor as school related.   Social Determinants of Health   Financial Resource Strain:    Difficulty of Paying Living Expenses:   Food Insecurity:    Worried About Programme researcher, broadcasting/film/videounning Out of Food in the Last Year:    Baristaan Out of Food in the Last Year:   Transportation Needs:    Freight forwarderLack of Transportation (Medical):    Lack of Transportation (Non-Medical):   Physical Activity:    Days of Exercise per Week:    Minutes of Exercise per Session:   Stress:    Feeling of Stress :   Social Connections:    Frequency of Communication with Friends and Family:  Frequency of Social Gatherings with Friends and Family:    Attends Religious Services:    Active Member of Clubs or Organizations:    Attends Banker Meetings:    Marital Status:    Hospital Course: (Per Md's admission evaluation notes): 23, single, no children, 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 Presented to hospital on 3/16 voluntarily. She reports worsening depression over recent days and states that on day of admission, "I felt like I was in a real dark place". She attributes depression in part to academic stressors/load.  Endorses neuro-vegetative symptoms- hypersomnia, erratic appetite, decreased energy level, anhedonia, decreased sense of concentration. She reports recent suicidal ideations  over the day or two prior to her admission, which she describes as " thinking it would be better to die". She reports recent thoughts of overdosing. Denies psychotic symptoms. In addition to depression she also describes significant anxiety, which she describes mainly as worrying excessively/anxious ruminations.  She does have occasional panic attacks. Home medications- Effexor XR and Propranolol, which she reports she was taking on a PRN basis for anxiety. She has been on Effexor for more than a year. She states she stopped it a few days prior to admission. She is unsure why she stopped , but states " I guess I didn't care anymore". She does state Effexor has generally been helpful and well tolerated.  She does state that in the past it was increased to 225 mg daily and that she did not tolerate that dose well due to which it was decreased back down to 150 mg daily which she did tolerate.  She has been on BuSpar in the past which she does not feel helped. Denies history of prior psychiatric admissions, denies history of past suicidal attempts or history of self cutting. She reports a prior history of depression, which she describes as intermittent starting as a teenager. She describes brief episodes of feeling more energetic but describes these as short , lasting a few hours, and currently does not describe history of overt hypomanic or manic episodes. She currently denies PTSD history. She does report history of anxiety, with episodes of panic attacks. Denies medical illnesses. Allergic to Amoxicillin. Does not smoke, vapes tobacco product . Denies alcohol abuse, endorses daily cannabis use. Denies other drug abuse . Family history- she reports both parents have history of depression. No suicides in family.  Amanda Fowler is a 24 y.o female who was admitted to the behavioral health hospital after voluntarily presenting for an assessment due to worsening depression and suicidal ideations with thoughts of  overdosing or an intentional car accident. After evaluation by the psychiatric team, it was recommended by the psychiatrist that she resume Effexor ER at 150 mg for depression and anxiety. The psychiatrist also recommended augmenting with Abilify 2 mg daily for depression. The psychiatrist reviewed the risks/benefits and side effects of these medications. The patient was in agreement with this plan. Amanda has participated in the unit milieu and group therapy to learn coping skills to help manage her anxiety and depression. Amanda has consistently reported that she has no suicidal intent. She is able to verbally contract for safety outside of the hospital.   During the course of her hospitalization, the 15-minute checks were adequate to ensure Amanda's safety. Patient did not display any dangerous violent or suicidal behavior on the unit. She interacted with patients and staff appropriately, participated appropriately in the group sessions/therapies. Her medications were  addressed and adjusted to meet her needs. She was recommended for outpatient follow-up care and medication management upon discharge to assure continuity of care and mood stability. At the time of discharge the patient is not reporting any acute suicidal or homicidal ideations. She feels more confident about her self-care and in managing the suicidal ideations. She currently denies any new issues or concerns. Education and supportive counseling provided throughout the hospital stay and upon discharge.  Today upon her discharge evaluation with the attending psychiatrist, Amanda shared that she is doing well. She denies any other specific concerns. She is sleeping well. Her appetite is good. She denies other physical complaints. She denies auditory and visual hallucinations. She feels that her medications have been helpful and is in agreement to continue her current treatment regimen. She was able to engage in safety  planning, including plan to return to [facility] or contact emergency services if she feels unable to maintain her own safety or the safety of others. Patient has no further questions, comments, or concerns. She left [facility] with all personal belongings and in no apparent distress. Transportation scheduled by the [facility] social worker.   Physical Findings: AIMS: Facial and Oral Movements Muscles of Facial Expression: None, normal Lips and Perioral Area: None, normal Jaw: None, normal Tongue: None, normal,Extremity Movements Upper (arms, wrists, hands, fingers): None, normal Lower (legs, knees, ankles, toes): None, normal, Trunk Movements Neck, shoulders, hips: None, normal, Overall Severity Severity of abnormal movements (highest score from questions above): None, normal Incapacitation due to abnormal movements: None, normal Patient's awareness of abnormal movements (rate only patient's report): No Awareness, Dental Status Current problems with teeth and/or dentures?: No Does patient usually wear dentures?: No  CIWA:  CIWA-Ar Total: 0 COWS:  COWS Total Score: 2  Musculoskeletal: Strength & Muscle Tone: within normal limits Gait & Station: normal Patient leans: N/A  Psychiatric Specialty Exam: Physical Exam  Nursing note and vitals reviewed. Constitutional: She is oriented to person, place, and time. She appears well-developed.  Eyes: Pupils are equal, round, and reactive to light.  Cardiovascular: Normal rate.  Respiratory: Effort normal.  Genitourinary:    Genitourinary Comments: Deferred   Musculoskeletal:        General: Normal range of motion.     Cervical back: Normal range of motion.  Neurological: She is alert and oriented to person, place, and time.  Skin: Skin is warm and dry.    Review of Systems  Constitutional: Negative for chills, diaphoresis and fever.  HENT: Negative for congestion, sneezing and sore throat.   Eyes: Negative for discharge.  Respiratory: Negative for cough,  chest tightness and wheezing.   Cardiovascular: Negative for chest pain and palpitations.  Gastrointestinal: Negative for diarrhea, nausea and vomiting.  Endocrine: Negative for cold intolerance.  Genitourinary: Negative for difficulty urinating.  Musculoskeletal: Negative for myalgias.  Skin: Negative for color change, pallor, rash and wound.  Allergic/Immunologic: Negative for environmental allergies and food allergies.       Allergies: Amoxicillin  Neurological: Negative for dizziness, tremors, seizures, syncope, weakness, light-headedness, numbness and headaches.  Psychiatric/Behavioral: Positive for dysphoric mood (Stabilized with medication prior to discharge). Negative for agitation, behavioral problems, confusion, decreased concentration, hallucinations, self-injury, sleep disturbance ( Stable ) and suicidal ideas (Stable). The patient is not hyperactive. Nervous/anxious:         Blood pressure (!) 121/53, pulse 100, temperature 99 F (37.2 C), temperature source Oral, resp. rate 16, height 5\' 8"  (1.727 m), weight 83.5 kg, SpO2 99 %.Body mass  index is 27.98 kg/m.  See Md's discharge SRA  Sleep:  Number of Hours: 6.75   Has this patient used any form of tobacco in the last 30 days? (Cigarettes, Smokeless Tobacco, Cigars, and/or Pipes): Yes, an FDA-approved tobacco cessation medication was Recommended at discharge.  Blood Alcohol level:  Lab Results  Component Value Date   ETH <10 03/12/2020   Metabolic Disorder Labs:  Lab Results  Component Value Date   HGBA1C 5.2 03/12/2020   MPG 102.54 03/12/2020   No results found for: PROLACTIN Lab Results  Component Value Date   CHOL 281 (H) 03/12/2020   TRIG 147 03/12/2020   HDL 50 03/12/2020   CHOLHDL 5.6 03/12/2020   VLDL 29 03/12/2020   LDLCALC 202 (H) 03/12/2020   LDLCALC 122 (H) 08/09/2018   See Psychiatric Specialty Exam and Suicide Risk Assessment completed by Attending Physician prior to discharge.  Discharge  destination:  Home  Is patient on multiple antipsychotic therapies at discharge:  No   Has Patient had three or more failed trials of antipsychotic monotherapy by history:  No  Recommended Plan for Multiple Antipsychotic Therapies: NA   Allergies as of 03/14/2020       Reactions   Amoxicillin Hives   Has patient had a PCN reaction causing immediate rash, facial/tongue/throat swelling, SOB or lightheadedness with hypotension: Yes Has patient had a PCN reaction causing severe rash involving mucus membranes or skin necrosis: Yes Has patient had a PCN reaction that required hospitalization: No Has patient had a PCN reaction occurring within the last 10 years: Unknown If all of the above answers are "NO", then may proceed with Cephalosporin use.        Medication List     STOP taking these medications    amphetamine-dextroamphetamine 10 MG tablet Commonly known as: ADDERALL   Vyvanse 30 MG capsule Generic drug: lisdexamfetamine       TAKE these medications      Indication  ARIPiprazole 2 MG tablet Commonly known as: ABILIFY Take 1 tablet (2 mg total) by mouth daily. As an djunct to Effexor-XR: For depression Start taking on: March 15, 2020  Indication: As an djunct to Effexor-XR:   levonorgestrel 20 MCG/24HR IUD Commonly known as: MIRENA 1 each by Intrauterine route once.  Indication: Birth Control Treatment   nicotine 14 mg/24hr patch Commonly known as: NICODERM CQ - dosed in mg/24 hours Place 1 patch (14 mg total) onto the skin daily. (May buy from over the counter): For smoking cessation Start taking on: March 15, 2020  Indication: Nicotine Addiction   propranolol 10 MG tablet Commonly known as: INDERAL Take 1 tablet (10 mg total) by mouth 3 (three) times daily. For anxiety What changed:  additional instructions Another medication with the same name was removed. Continue taking this medication, and follow the directions you see here.  Indication: Feeling  Anxious   traZODone 50 MG tablet Commonly known as: DESYREL Take 1 tablet (50 mg total) by mouth at bedtime as needed for sleep.  Indication: Trouble Sleeping   venlafaxine XR 150 MG 24 hr capsule Commonly known as: EFFEXOR-XR Take 1 capsule (150 mg total) by mouth daily with breakfast. Start taking on: March 15, 2020 What changed:  medication strength how much to take Another medication with the same name was removed. Continue taking this medication, and follow the directions you see here.  Indication: Major Depressive Disorder       Follow-up Information     [facility] up on 03/26/2020.   Why: You are scheduled for an appointment on 03/26/20 at 9:00 am with [facility].  This will be a virtual tele-health appointment. Contact information: [address]  [place], [place] [address]  P:  [phone] F:  [place] Follow up on 03/17/2020.   Specialty: Behavioral Health Why: You have an appointment with Loistine Chance on Monday, 03/17/20 at 1:00 PM.  This will be a virtual tele-health appointment. Be sure to present any discharge paperwork from this hospitalization.  Contact information: [place] [phone] [place] [phone]          Follow-up recommendations: Activity:  As tolerated Diet: As recommended by your primary care doctor. Keep all scheduled follow-up appointments as recommended.  Comments: Prescriptions given at discharge.  Patient agreeable to plan.  Given opportunity to ask questions.  Appears to feel comfortable with discharge denies any current suicidal or homicidal thought. Patient is also instructed prior to discharge to: Take all medications as prescribed by his/her mental healthcare provider. Report any adverse effects and or reactions from the medicines to his/her outpatient provider promptly. Patient has been  instructed & cautioned: To not engage in alcohol and or illegal drug use while on prescription medicines. In the event of worsening symptoms, patient is instructed to call the crisis hotline, 911 and or go to the nearest ED for appropriate evaluation and treatment of symptoms. To follow-up with his/her primary care provider for your other medical issues, concerns and or health care needs.  Signed: Lindell Spar, NP, PMHNP, FNP-BC 03/14/2020, 10:36 AM  Patient seen, Suicide Assessment Completed.  Disposition Plan Reviewed

## 2020-03-14 NOTE — Progress Notes (Signed)
RN met with pt and reviewed pt's discharge instructions.  Pt verbalized understanding of discharge instructions and pt did not have any questions. RN returned pt's belongings to pt.  Prescriptions were given to pt.  Pt denies SI/HI/AVH and voiced no concerns.  Pt was appreciative of the care pt received at [facility].  Patient discharged to the lobby where family was waiting to take pt home.

## 2020-03-14 NOTE — Progress Notes (Signed)
Recreation Therapy Notes  Date:  3.19.21 Time: 0930 Location: [address] Group Room  Group Topic: Stress Management  Goal Area(s) Addresses:  Patient will identify positive stress management techniques. Patient will identify benefits of using stress management post d/c.  Behavioral Response: Engaged  Intervention: Stress Management  Activity: Progressive Muscle Relaxation.  LRT led group in tensing and releasing each muscle individually.  Patients were to listen and follow along to engage in activity.     Education:  Stress Management, Discharge Planning.   Education Outcome: Acknowledges Education  Clinical Observations/Feedback: Patient attended and participated.    Caroll Rancher, LRT/CTRS         Caroll Rancher A 03/14/2020 11:46 AM

## 2020-03-14 NOTE — BHH Suicide Risk Assessment (Signed)
[facility] Discharge Suicide Risk Assessment   Principal Problem: MDD (major depressive disorder), recurrent severe, without psychosis ([place]) Discharge Diagnoses: Principal Problem:   MDD (major depressive disorder), recurrent severe, without psychosis ([place])   Total Time spent with patient: 30 minutes  Musculoskeletal: Strength & Muscle Tone: within normal limits Gait & Station: normal Patient leans: N/A  Psychiatric Specialty Exam: Review of Systems denies dizziness or lightheadedness, denies headache, no chest pain, no shortness of breath, no nausea, no vomiting, no rash  Blood pressure (!) 121/53, pulse 100, temperature 99 F (37.2 C), temperature source Oral, resp. rate 16, height 5\' 8"  (1.727 m), weight 83.5 kg, SpO2 99 %.Body mass index is 27.98 kg/m.  General Appearance: Improved grooming  Eye Contact::  Good  Speech:  Normal Rate409  Volume:  Normal  Mood:  Reports improving mood  Affect:  More reactive, brighter  Thought Process:  Linear and Descriptions of Associations: Intact  Orientation:  Other:  Fully alert and attentive  Thought Content:  Denies hallucinations, no delusions expressed, not internally preoccupied  Suicidal Thoughts:  No no current suicidal or self-injurious ideations  Homicidal Thoughts:  No  Memory:  Recent and remote grossly intact  Judgement:  Other:  Improving  Insight:  Improving  Psychomotor Activity:  Normal-no psychomotor agitation or restlessness  Concentration:  Good  Recall:  Good  Fund of Knowledge:Good  Language: Good  Akathisia:  Negative  Handed:  Right  AIMS (if indicated):     Assets:  Communication Skills Desire for Improvement Physical Health Resilience  Sleep:  Number of Hours: 6.75  Cognition: WNL  ADL's:  Intact   Mental Status Per Nursing Assessment::   On Admission:  Suicidal ideation indicated by patient  Demographic Factors:  24 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  Reported Effexor XR had generally been effective and well-tolerated.  Had stopped venlafaxine a few days prior to admission.  Risk Reduction Factors:   Sense of responsibility to family, Living with another person, especially a relative, Positive social support and Positive coping skills or problem solving skills  Continued Clinical Symptoms:  Patient is presenting alert, attentive, calm, without psychomotor agitation or restlessness.  Polite on approach.  Good eye contact.  Mood described as improved.  Affect improved and brighter.  No thought disorder.  Denies suicidal or self-injurious ideations.  Denies homicidal or violent ideations.  No hallucinations.  No delusions.  O x3. Denies medication side effects.  As noted, she reports a history of good tolerance and response to Effexor XR  Behavior on unit in good control, no disruptive or agitated behaviors. With her expressed consent I attempted to reach father via phone.  No answer at this time.  (CSW spoke with mother yesterday)  Cognitive Features That Contribute To Risk:  No gross cognitive deficits noted upon discharge. Is alert , attentive, and oriented x 3   Suicide Risk:  Mild:  Suicidal ideation of limited frequency, intensity, duration, and specificity.  There are no identifiable plans, no associated intent, mild dysphoria and related symptoms, good self-control (both objective and subjective assessment), few other risk factors, and identifiable protective factors, including available and accessible social support.  [place] Follow up on 03/26/2020.   Why: You are scheduled for an appointment on 03/26/20 at 9:00 am with [facility].  This will be a virtual tele-health appointment. Contact information: [address]  [place], [place]  [address]  P:  [phone] F:  [phone]       BEHAVIORAL HEALTH PARTIAL HOSPITALIZATION PROGRAM Follow up.   Specialty: Behavioral Health Why: You have an appointment with Milana Na on Monday, 03/17/20 at 1:00 PM.  This will be a virtual tele-health appointment. Be sure to present any discharge paperwork from this hospitalization.  Contact information: [address] [id number] mc [place] [address] [phone]          Plan Of Care/Follow-up recommendations:  Activity:  As tolerated Diet:  Regular Tests:  NA Other:  See below  Patient is expressing readiness for discharge.  She is leaving unit in good spirits.  States she plans to stay with her parents for period of time for added support.  Her father will be picking her up later today.  Plans to follow-up as above, expresses interest in PHP.  Craige Cotta, MD 03/14/2020, 9:27 AM

## 2020-03-16 ENCOUNTER — Encounter (HOSPITAL_COMMUNITY): Payer: Self-pay

## 2020-03-16 DIAGNOSIS — F320 Major depressive disorder, single episode, mild: Secondary | ICD-10-CM

## 2020-03-16 NOTE — BH Assessment (Signed)
Q-Actual   Writer introduced myself and informed the patient the I would be following up with her every 2 weeks to complete a PHQ-9 assessment.  Patient reports that she has been completing the daily wellness checks.  Patient reports that her support team members have been set up with the app.

## 2020-03-17 ENCOUNTER — Other Ambulatory Visit: Payer: Self-pay

## 2020-03-17 ENCOUNTER — Ambulatory Visit (HOSPITAL_COMMUNITY): Payer: BC Managed Care – PPO | Admitting: Professional

## 2020-03-17 DIAGNOSIS — F332 Major depressive disorder, recurrent severe without psychotic features: Secondary | ICD-10-CM

## 2020-03-18 ENCOUNTER — Other Ambulatory Visit: Payer: Self-pay

## 2020-03-18 ENCOUNTER — Other Ambulatory Visit (HOSPITAL_COMMUNITY): Payer: BC Managed Care – PPO

## 2020-03-18 ENCOUNTER — Telehealth (HOSPITAL_COMMUNITY): Payer: Self-pay | Admitting: Professional

## 2020-03-19 ENCOUNTER — Other Ambulatory Visit (HOSPITAL_COMMUNITY): Payer: BC Managed Care – PPO | Attending: Psychiatry | Admitting: Licensed Clinical Social Worker

## 2020-03-19 ENCOUNTER — Encounter: Payer: Self-pay | Admitting: Certified Nurse Midwife

## 2020-03-19 ENCOUNTER — Other Ambulatory Visit: Payer: Self-pay

## 2020-03-19 ENCOUNTER — Encounter (HOSPITAL_COMMUNITY): Payer: Self-pay

## 2020-03-19 DIAGNOSIS — F419 Anxiety disorder, unspecified: Secondary | ICD-10-CM | POA: Insufficient documentation

## 2020-03-19 DIAGNOSIS — F339 Major depressive disorder, recurrent, unspecified: Secondary | ICD-10-CM | POA: Insufficient documentation

## 2020-03-19 DIAGNOSIS — F909 Attention-deficit hyperactivity disorder, unspecified type: Secondary | ICD-10-CM | POA: Insufficient documentation

## 2020-03-19 DIAGNOSIS — F332 Major depressive disorder, recurrent severe without psychotic features: Secondary | ICD-10-CM
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
## 2020-03-19 NOTE — Progress Notes (Signed)
Spoke with patient via Webex video call, used 2 identifiers to correctly identify patient. States she was inpatient at [facility] for suicidal and obsessive thoughts. She has struggled with depression/suicide thoughts since age 24. Currently denies SI/HI or AV hallucinations. 2 days before inpatient treatment she cut off her hair into a short cut on impulse. 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 She has a lot of stress with "trying to keep up with everything." This is her first time in PHP. She takes Adderall and Vyvanse but alternates them and takes Adderall when she has a short day and Vyvanse when she has a long day to maintain focus. Never takes them at the same time. On scale 1-10 as 10 being worst she rates depression at 3 and anxiety 3. PHQ9=11. No side effects from medication. No issues or complaints.

## 2020-03-19 NOTE — Psych (Signed)
Virtual Visit via Video Note  I connected with Amanda Fowler on 03/19/20 at  9:00 AM EDT by a video enabled telemedicine application and verified that I am speaking with the correct person using two identifiers.   I discussed the limitations of evaluation and management by telemedicine and the availability of in person appointments. The patient expressed understanding and agreed to proceed.   Follow Up Instructions:    I discussed the assessment and treatment plan with the patient. The patient was provided an opportunity to ask questions and all were answered. The patient agreed with the plan and demonstrated an understanding of the instructions.   The patient was advised to call back or seek an in-person evaluation if the symptoms worsen or if the condition fails to improve as anticipated.  I provided 10 minutes of non-face-to-face time during this encounter.  Patient verbally agrees to treatment plan.   Quinn Axe, [facility]

## 2020-03-20 ENCOUNTER — Other Ambulatory Visit: Payer: Self-pay

## 2020-03-20 ENCOUNTER — Other Ambulatory Visit (HOSPITAL_COMMUNITY): Payer: BC Managed Care – PPO

## 2020-03-20 ENCOUNTER — Other Ambulatory Visit (HOSPITAL_COMMUNITY): Payer: BC Managed Care – PPO | Admitting: Licensed Clinical Social Worker

## 2020-03-20 DIAGNOSIS — F419 Anxiety disorder, unspecified: Secondary | ICD-10-CM | POA: Diagnosis not present

## 2020-03-20 DIAGNOSIS — F332 Major depressive disorder, recurrent severe without psychotic features: Secondary | ICD-10-CM

## 2020-03-20 DIAGNOSIS — F909 Attention-deficit hyperactivity disorder, unspecified type: Secondary | ICD-10-CM | POA: Diagnosis not present

## 2020-03-20 DIAGNOSIS — F339 Major depressive disorder, recurrent, unspecified: Secondary | ICD-10-CM | POA: Diagnosis not present

## 2020-03-20 NOTE — Progress Notes (Signed)
GROUP NOTE - 03/19/2020 Spiritual care group  11:00 - 12:00 ? Group met via web-ex due to COVID-19 precautions.  Group facilitated by Simone Curia, MDiv, BCC  ? ? Group focused on topic of strength. ?Group members reflected on what thoughts and feelings emerge when they hear this topic. ?They then engaged in facilitated dialog around how strength is present in their lives. This dialog focused on representing what strength had been to them in their lives (images and patterns given) and what they saw as helpful in their life now (what they needed / wanted). ? ? Activity drew on narrative framework   Amanda Fowler arrived at group late due to meeting with other providers.  Had camera on and off through group.  Amanda Fowler did not engage in group discussion.  She did not share group activity

## 2020-03-21 ENCOUNTER — Other Ambulatory Visit: Payer: Self-pay

## 2020-03-21 ENCOUNTER — Other Ambulatory Visit (HOSPITAL_COMMUNITY): Payer: BC Managed Care – PPO

## 2020-03-21 ENCOUNTER — Telehealth (HOSPITAL_COMMUNITY): Payer: Self-pay | Admitting: Professional

## 2020-03-24 ENCOUNTER — Other Ambulatory Visit (HOSPITAL_COMMUNITY): Payer: BC Managed Care – PPO | Admitting: Specialist

## 2020-03-24 ENCOUNTER — Other Ambulatory Visit: Payer: Self-pay

## 2020-03-24 ENCOUNTER — Other Ambulatory Visit (HOSPITAL_COMMUNITY): Payer: BC Managed Care – PPO | Admitting: Licensed Clinical Social Worker

## 2020-03-24 ENCOUNTER — Encounter (HOSPITAL_COMMUNITY): Payer: Self-pay

## 2020-03-24 DIAGNOSIS — F909 Attention-deficit hyperactivity disorder, unspecified type: Secondary | ICD-10-CM | POA: Diagnosis not present

## 2020-03-24 DIAGNOSIS — F332 Major depressive disorder, recurrent severe without psychotic features: Secondary | ICD-10-CM

## 2020-03-24 DIAGNOSIS — F339 Major depressive disorder, recurrent, unspecified: Secondary | ICD-10-CM | POA: Diagnosis not present

## 2020-03-24 DIAGNOSIS — F419 Anxiety disorder, unspecified: Secondary | ICD-10-CM | POA: Diagnosis not present

## 2020-03-24 DIAGNOSIS — R4589 Other symptoms and signs involving emotional state: Secondary | ICD-10-CM

## 2020-03-24 NOTE — Therapy (Addendum)
Virtual Visit via Video Note  I connected with Amanda Fowler on 03/24/20 at 1045 AM EDT by a video enabled telemedicine application and verified that I am speaking with the correct person using two identifiers.   I discussed the limitations of evaluation and management by telemedicine and the availability of in person appointments. The patient expressed understanding and agreed to proceed.    I discussed the assessment and treatment plan with the patient. The patient was provided an opportunity to ask questions and all were answered. The patient agreed with the plan and demonstrated an understanding of the instructions.   The patient was advised to call back or seek an in-person evaluation if the symptoms worsen or if the condition fails to improve as anticipated.  I provided 70 minutes of non-face-to-face time during this encounter.     [facility] PARTIAL HOSPITALIZATION PROGRAM [address] [place], [place], [address] Phone: [phone]   Fax:  [phone]  Occupational Therapy Evaluation  Patient Details  Name: Amanda Fowler MRN: [id number] Date of Birth: 1996-12-03 Referring Provider (OT): Joyice Faster   Encounter Date: 03/24/2020  OT End of Session - 03/24/20 1432    Visit Number  1    Number of Visits  8    Date for OT Re-Evaluation  04/21/20    Progress Note Due on Visit  10    OT Start Time  1045    OT Stop Time  1155    OT Time Calculation (min)  70 min    Activity Tolerance  Patient tolerated treatment well    Behavior During Therapy  [facility] for tasks assessed/performed       Past Medical History:  Diagnosis Date  . ADHD   . Anxiety   . Depression   . Heart murmur   . Migraines    with aura    Past Surgical History:  Procedure Laterality Date  . BIOPSY THYROID     benign    There were no vitals filed for this visit.  Subjective Assessment - 03/24/20 1430    Currently in Pain?  No/denies    Pain Score   0-No pain        [facility] OT Assessment - 03/24/20 0001      Assessment   Medical Diagnosis  MDD, Difficulty Coping    Referring Provider (OT)  Joyice Faster    Prior Therapy  n/a       Precautions   Precautions  None      Balance Screen   Has the patient fallen in the past 6 months  No    Has the patient had a decrease in activity level because of a fear of falling?   No    Is the patient reluctant to leave their home because of a fear of falling?   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[employer]) OT goal establish routine and set boundaries  OCAIRS Mental Health Interview Summary of Client Scores:  FACILITATES PARTICIPATION IN OCCUPATION  ALLOWS PARTICIPATION IN OCCUPATION INHIBITS PARTICIPATION IN OCCUPATION RESTRICTS PARTICIPATION IN OCCUPATION COMMENTS  ROLES  x     HABITS    x   PERSONAL CAUSATION    x   VALUES   x    INTERESTS   x    SKILLS   x    SHORT TERM GOALS    x   LONG TERM GOALS    x   INTERPETATION OF PAST EXPERIENCES   x    PHYSICAL ENVIRONMENT x      SOCIAL ENVIRONMENT   x    READINESS FOR CHANGE   x     Need for Occupational Therapy:  4 Shows positive occupational participation, no need for OT.   3 Need for minimal intervention/consultative participation  x 2 Need for OT intervention indicated to restore/improve participation   1 Need for extensive OT intervention indicated to improve participation.  Referral for follow up services also recommended.   Assessment:  Patient demonstrates behavior that inhibits participation in occupation.   Patient will benefit from occupational therapy intervention in order to improve time management, financial management, stress management, job readiness skills, social skills, and health management skills in preparation to return to full time community living and to be a productive community member.   Plan:  Patient will participate in skilled occupational therapy sessions individually or in a group setting to improve coping skills, psychosocial skills, and emotional skills required to return to prior level of function. Treatment will be 4-5 times per week for 3 weeks.     Patient participated in skilled OT group focusing on financial management.  Patient identified current methods of budgeting, and ways to improve budgeting.  Patient was educated on budgeting for necessities, creative ways of saving, and educated on use of a budget binder.  Patient was educated and directed to identify current income, review several months of expenditures, and then create budget.  Assessment:  Patient demonstrates **motivation type.  Patient will benefit from occupational therapy intervention in order to improve time management, financial management, stress management, job readiness skills, social skills,sleep hygiene, exercise and healthy eating habits,  and health management skills and other psychosocial skills needed for preparation to return to full time community living and to be a productive community member.  Patient was engaged in group this date.  Patient shared that she has apps that will help track her spending, however, she doesn't hold herself accountable to stop spending when she knows she should.  She tends to waste money on food and Biomedical engineer.  Plan:  Patient will participate in skilled occupational therapy sessions individually or in a group setting to improve coping skills, psychosocial skills, and emotional skills required to return to prior level of function as a productive community member.  Treatment will be 1-2 times per week for 2-6 weeks.              OT Education - 03/24/20 1430    Education Details  financial management strategies    Person(s) Educated  Patient    Methods  Explanation       OT Short Term Goals - 03/24/20 1433      OT SHORT TERM GOAL #1   Title  Pt will apply psychosocial skills and coping mechanisms to daily activities in order to function independently and reintegrate into community    Time  4    Period  Weeks    Status  New  Target Date  04/21/20      OT SHORT TERM GOAL #2   Title  Pt will create and implement functional BADL/IADL routine upon reintegrating into the community for increased engagement in all daily, work, and leisure activities    Time  4    Period  Weeks    Status  New      OT SHORT TERM GOAL #3   Title  Pt will engage in goal setting to improve functional BADL/IADL routine upon reintegrating into community.    Time  4    Period  Weeks    Status  New      OT SHORT TERM GOAL #4   Title  Pt will recall and/or apply 1-3 sleep hygiene strategies to improve function in BADL routine upon reintegrating into community    Time  4    Period  Weeks    Status  New               Plan - 03/24/20 1431    OT Occupational Profile and History  Problem Focused Assessment - Including review of records relating to presenting problem    Occupational performance deficits (Please refer to evaluation for details):  ADL's;IADL's;Rest and Sleep;Education;Leisure;Social Participation    Body Structure / Function / Physical Skills  ADL    Cognitive Skills  Attention;Energy/Drive;Learn    Psychosocial Skills  Coping Strategies;Habits;Routines and Behaviors    Rehab Potential  Good    Clinical Decision Making  Limited treatment options, no task modification necessary    Comorbidities Affecting Occupational Performance:  None    Modification or Assistance to Complete Evaluation   No modification of tasks or assist necessary to  complete eval    OT Frequency  2x / week    OT Duration  4 weeks    OT Treatment/Interventions  Self-care/ADL training;Psychosocial skills training;Coping strategies training;Patient/family education    Consulted and Agree with Plan of Care  Patient       Patient will benefit from skilled therapeutic intervention in order to improve the following deficits and impairments:   Body Structure / Function / Physical Skills: ADL Cognitive Skills: Attention, Energy/Drive, Learn Psychosocial Skills: Coping Strategies, Habits, Routines and Behaviors   Visit Diagnosis: Difficulty coping  MDD (major depressive disorder), recurrent severe, without psychosis (HCC)    Problem List Patient Active Problem List   Diagnosis Date Noted  . MDD (major depressive disorder), recurrent severe, without psychosis (HCC) 03/11/2020  . Menstrual migraine 08/31/2013  . Menorrhagia 08/31/2013    Shirlean Mylar, MHA, OTR/L [phone]  03/24/2020, 2:36 PM  [facility] PARTIAL HOSPITALIZATION PROGRAM [address] [place], [place], [address] Phone: [phone]   Fax:  [phone]  Name: Amanda Fowler MRN: [id number] Date of Birth: 30-May-1996

## 2020-03-24 NOTE — Progress Notes (Signed)
Spoke with patient via Webex video call, used 2 identifiers to correctly identify patient. Groups are going well for her. She emailed her professors this past week and they responded with support. One did not respond but she finds him to be a difficult professor. Did have some self harm thoughts over the weekend but denies SI/ HI or AV hallucinations. She does not feel that her Inderal is working any longer and would like to discuss the possibility of another anxiety medication. She becomes very anxious when she has to go in out in public or around people. Scared of judgements of people. Finds it hard to talk in group but is trying her best. No side effects. On scale 1-10 as 10 being worst she rates depression at 5 and anxiety at 7. No other issues or complaints.

## 2020-03-25 ENCOUNTER — Ambulatory Visit (HOSPITAL_COMMUNITY): Payer: BC Managed Care – PPO

## 2020-03-25 ENCOUNTER — Other Ambulatory Visit (HOSPITAL_COMMUNITY): Payer: BC Managed Care – PPO

## 2020-03-25 ENCOUNTER — Other Ambulatory Visit: Payer: Self-pay

## 2020-03-26 ENCOUNTER — Other Ambulatory Visit (HOSPITAL_COMMUNITY): Payer: BC Managed Care – PPO

## 2020-03-26 ENCOUNTER — Other Ambulatory Visit: Payer: Self-pay

## 2020-03-26 ENCOUNTER — Telehealth (HOSPITAL_COMMUNITY): Payer: Self-pay | Admitting: Professional

## 2020-03-26 NOTE — Telephone Encounter (Signed)
Cln called to check with pt since pt not in group 2x. Pt states she has felt very sleepy the last few days and has "accidentally overslept and missed group." Pt reports "I could feel myself tanking" after moving back to apt. Cln safety planned with pt; pt moved back to parents this morning. Cln provided crisis number [phone] Cln discussed 3 abs. Rule; pt understands and reports she is getting a lot from group and will be in attendance tomorrow.

## 2020-03-27 ENCOUNTER — Other Ambulatory Visit: Payer: Self-pay

## 2020-03-27 ENCOUNTER — Other Ambulatory Visit (HOSPITAL_COMMUNITY): Payer: BC Managed Care – PPO

## 2020-03-27 ENCOUNTER — Other Ambulatory Visit (HOSPITAL_COMMUNITY): Payer: BC Managed Care – PPO | Attending: Psychiatry | Admitting: Licensed Clinical Social Worker

## 2020-03-27 DIAGNOSIS — Z760 Encounter for issue of repeat prescription: Secondary | ICD-10-CM | POA: Insufficient documentation

## 2020-03-27 DIAGNOSIS — F909 Attention-deficit hyperactivity disorder, unspecified type: Secondary | ICD-10-CM | POA: Insufficient documentation

## 2020-03-27 DIAGNOSIS — R45851 Suicidal ideations: Secondary | ICD-10-CM | POA: Diagnosis not present

## 2020-03-27 DIAGNOSIS — Z658 Other specified problems related to psychosocial circumstances: Secondary | ICD-10-CM | POA: Insufficient documentation

## 2020-03-27 DIAGNOSIS — Z558 Other problems related to education and literacy: Secondary | ICD-10-CM | POA: Diagnosis not present

## 2020-03-27 DIAGNOSIS — F419 Anxiety disorder, unspecified: Secondary | ICD-10-CM | POA: Diagnosis not present

## 2020-03-27 DIAGNOSIS — Z79899 Other long term (current) drug therapy: Secondary | ICD-10-CM | POA: Diagnosis not present

## 2020-03-27 DIAGNOSIS — F339 Major depressive disorder, recurrent, unspecified: Secondary | ICD-10-CM | POA: Insufficient documentation

## 2020-03-27 DIAGNOSIS — Z793 Long term (current) use of hormonal contraceptives: Secondary | ICD-10-CM | POA: Insufficient documentation

## 2020-03-27 DIAGNOSIS — F332 Major depressive disorder, recurrent severe without psychotic features: Secondary | ICD-10-CM

## 2020-03-27 DIAGNOSIS — F17210 Nicotine dependence, cigarettes, uncomplicated: Secondary | ICD-10-CM | POA: Insufficient documentation

## 2020-03-28 ENCOUNTER — Telehealth (HOSPITAL_COMMUNITY): Payer: Self-pay | Admitting: Professional

## 2020-03-28 ENCOUNTER — Other Ambulatory Visit: Payer: Self-pay

## 2020-03-28 ENCOUNTER — Other Ambulatory Visit (HOSPITAL_COMMUNITY): Payer: BC Managed Care – PPO

## 2020-03-28 ENCOUNTER — Other Ambulatory Visit (HOSPITAL_COMMUNITY): Payer: BC Managed Care – PPO | Admitting: Licensed Clinical Social Worker

## 2020-03-28 DIAGNOSIS — Z658 Other specified problems related to psychosocial circumstances: Secondary | ICD-10-CM | POA: Diagnosis not present

## 2020-03-28 DIAGNOSIS — F17210 Nicotine dependence, cigarettes, uncomplicated: Secondary | ICD-10-CM | POA: Diagnosis not present

## 2020-03-28 DIAGNOSIS — F339 Major depressive disorder, recurrent, unspecified: Secondary | ICD-10-CM | POA: Diagnosis not present

## 2020-03-28 DIAGNOSIS — Z79899 Other long term (current) drug therapy: Secondary | ICD-10-CM | POA: Diagnosis not present

## 2020-03-28 DIAGNOSIS — F909 Attention-deficit hyperactivity disorder, unspecified type: Secondary | ICD-10-CM | POA: Diagnosis not present

## 2020-03-28 DIAGNOSIS — F419 Anxiety disorder, unspecified: Secondary | ICD-10-CM | POA: Diagnosis not present

## 2020-03-28 DIAGNOSIS — Z558 Other problems related to education and literacy: Secondary | ICD-10-CM | POA: Diagnosis not present

## 2020-03-28 DIAGNOSIS — F332 Major depressive disorder, recurrent severe without psychotic features: Secondary | ICD-10-CM

## 2020-03-28 DIAGNOSIS — Z793 Long term (current) use of hormonal contraceptives: Secondary | ICD-10-CM | POA: Diagnosis not present

## 2020-03-28 DIAGNOSIS — R45851 Suicidal ideations: Secondary | ICD-10-CM | POA: Diagnosis not present

## 2020-03-28 DIAGNOSIS — Z760 Encounter for issue of repeat prescription: Secondary | ICD-10-CM | POA: Diagnosis not present

## 2020-03-28 NOTE — Progress Notes (Signed)
Virtual Visit via Video Note  I connected with Amanda Fowler on 03/28/20 at  9:00 AM EDT by a video enabled telemedicine application and verified that I am speaking with the correct person using two identifiers.   I discussed the limitations of evaluation and management by telemedicine and the availability of in person appointments. The patient expressed understanding and agreed to proceed.   I discussed the assessment and treatment plan with the patient. The patient was provided an opportunity to ask questions and all were answered. The patient agreed with the plan and demonstrated an understanding of the instructions.   The patient was advised to call back or seek an in-person evaluation if the symptoms worsen or if the condition fails to improve as anticipated.  I provided  15 minutes of non-face-to-face time during this encounter.   Derrill Center, NP    Behavioral Health Partial Program Assessment Note  Date: 03/28/2020 Name: Amanda Fowler MRN: [id number]  HPI: Amanda Fowler is a 24 y.o. Caucasian female presents with depression and anxiety after a recent inpatient admission. Reported she was having suicidal ideations. As reported by inpatient admission note: "Amanda Fowler is a 24 y.o female who was admitted to the behavioral health hospital after voluntarily presenting for an assessment due to worsening depression and suicidal ideations with thoughts of overdosing or an intentional car accident. After evaluation by the psychiatric team, it was recommended by the psychiatrist that she resume Effexor ER at 150 mg for depression and anxiety. The psychiatrist also recommended augmenting with Abilify 2 mg daily for depression. The psychiatrist reviewed the risks/benefits and side effects of these medications. The patient was in agreement with this plan. Amanda has participated in the unit milieu and group therapy to learn coping skills to help manage her anxiety  and depression. Amanda has consistently reported that she has no suicidal intent. "  Patient was enrolled in partial psychiatric program on 03/28/20.  Primary complaints include: feeling depressed and poor concentration.  Onset of symptoms was gradual with gradually worsening course since that time. Psychosocial Stressors include the following: family. Reported her main stressor was school work [employer]. 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 Reported struggling with derepression since from 24 years old to 68.     I have reviewed the following documentation dated : past psychiatric history and past social and family history. Patient reported family history with mental illness, father: depression/anxiety  and OCD. Mother: depression/ anxiety and ADD. Brother -   Complaints of Pain: none  Past Psychiatric History:  First psychiatric contact  and Past medication trials   Currently in treatment with .  Substance Abuse History: none Use of Alcohol: denied Use of Caffeine: denies use Use of over the counter:   Past Surgical History:  Procedure Laterality Date  . BIOPSY THYROID     benign    Past Medical History:  Diagnosis Date  . ADHD   . Anxiety   . Depression   . Heart murmur   . Migraines    with aura   Outpatient Encounter Medications as of 03/28/2020  Medication Sig Note  . amphetamine-dextroamphetamine (ADDERALL) 10 MG tablet Take 10 mg by mouth as needed. 03/19/2020: Takes when she has short school days  . ARIPiprazole (ABILIFY) 2 MG tablet Take 1 tablet (2 mg total) by mouth daily. As an djunct to Effexor-XR: For depression (Patient not taking: Reported on 03/24/2020)   . levonorgestrel (MIRENA) 20 MCG/24HR IUD 1 each by  Intrauterine route once.   . lisdexamfetamine (VYVANSE) 20 MG capsule Take 20 mg by mouth as needed. 03/19/2020: Takes when she has long school days  . nicotine (NICODERM CQ - DOSED IN MG/24 HOURS) 14 mg/24hr patch Place 1 patch (14  mg total) onto the skin daily. (May buy from over the counter): For smoking cessation (Patient not taking: Reported on 03/24/2020)   . propranolol (INDERAL) 10 MG tablet Take 1 tablet (10 mg total) by mouth 3 (three) times daily. For anxiety (Patient not taking: Reported on 03/24/2020)   . traZODone (DESYREL) 50 MG tablet Take 1 tablet (50 mg total) by mouth at bedtime as needed for sleep.   Marland Kitchen venlafaxine XR (EFFEXOR-XR) 150 MG 24 hr capsule Take 1 capsule (150 mg total) by mouth daily with breakfast.    No facility-administered encounter medications on file as of 03/28/2020.   Allergies  Allergen Reactions  . Amoxicillin Hives    Has patient had a PCN reaction causing immediate rash, facial/tongue/throat swelling, SOB or lightheadedness with hypotension: Yes Has patient had a PCN reaction causing severe rash involving mucus membranes or skin necrosis: Yes Has patient had a PCN reaction that required hospitalization: No Has patient had a PCN reaction occurring within the last 10 years: Unknown If all of the above answers are "NO", then may proceed with Cephalosporin use.     Social History   Tobacco Use  . Smoking status: Current Some Day Smoker    Types: E-cigarettes  . Smokeless tobacco: Never Used  Substance Use Topics  . Alcohol use: Yes    Alcohol/week: 1.0 standard drinks    Types: 1 Standard drinks or equivalent per week   Functioning Relationships: good support system Education: College       Please specify degree:  Other Pertinent History: None Family History  Problem Relation Age of Onset  . Thyroid disease Mother        hypo  . Anxiety disorder Mother   . Depression Mother   . ADD / ADHD Mother   . Aneurysm Maternal Aunt   . Aneurysm Maternal Grandmother   . Anxiety disorder Father   . Depression Father   . OCD Father      Review of Systems Constitutional: negative  Objective:  There were no vitals filed for this visit.  Physical Exam:   Mental Status  Exam: Appearance:  Well groomed Psychomotor::  Psychomotor Agitation Attention span and concentration: Normal Behavior: calm, cooperative and adequate rapport can be established Speech:  normal pitch Mood:  anxious Affect:  normal Thought Process:  Coherent Thought Content:  WDL Orientation:  person and place Cognition:  grossly intact Insight:  Intact Judgment:  Intact Estimate of Intelligence: Average Fund of knowledge: Aware of current events Memory: Recent and remote intact Abnormal movements: None Gait and station: Normal  Assessment:  Diagnosis: No primary diagnosis found. No diagnosis found.  Indications for admission: inpatient care required if not in partial hospital program  Plan: Order placed for occupational therapy. (OT) patient enrolled in Partial Hospitalization Program, patient's current medications are to be continued and side effects of medications have been reviewed with patient  Treatment options and alternatives reviewed with patient and patient understands the above plan. Treatment plan was reviewed and agreed upon by NP. T.Amanda Fowler and patient Amanda Fowler need for group services.     Oneta Rack, NP

## 2020-03-30 ENCOUNTER — Encounter (HOSPITAL_COMMUNITY): Payer: Self-pay | Admitting: Family

## 2020-03-31 ENCOUNTER — Other Ambulatory Visit (HOSPITAL_COMMUNITY): Payer: BC Managed Care – PPO | Admitting: Licensed Clinical Social Worker

## 2020-03-31 ENCOUNTER — Other Ambulatory Visit: Payer: Self-pay

## 2020-03-31 DIAGNOSIS — Z793 Long term (current) use of hormonal contraceptives: Secondary | ICD-10-CM | POA: Diagnosis not present

## 2020-03-31 DIAGNOSIS — F909 Attention-deficit hyperactivity disorder, unspecified type: Secondary | ICD-10-CM | POA: Diagnosis not present

## 2020-03-31 DIAGNOSIS — Z79899 Other long term (current) drug therapy: Secondary | ICD-10-CM | POA: Diagnosis not present

## 2020-03-31 DIAGNOSIS — Z558 Other problems related to education and literacy: Secondary | ICD-10-CM | POA: Diagnosis not present

## 2020-03-31 DIAGNOSIS — Z760 Encounter for issue of repeat prescription: Secondary | ICD-10-CM | POA: Diagnosis not present

## 2020-03-31 DIAGNOSIS — R45851 Suicidal ideations: Secondary | ICD-10-CM | POA: Diagnosis not present

## 2020-03-31 DIAGNOSIS — Z658 Other specified problems related to psychosocial circumstances: Secondary | ICD-10-CM | POA: Diagnosis not present

## 2020-03-31 DIAGNOSIS — F419 Anxiety disorder, unspecified: Secondary | ICD-10-CM | POA: Diagnosis not present

## 2020-03-31 DIAGNOSIS — F332 Major depressive disorder, recurrent severe without psychotic features: Secondary | ICD-10-CM

## 2020-03-31 DIAGNOSIS — F339 Major depressive disorder, recurrent, unspecified: Secondary | ICD-10-CM | POA: Diagnosis not present

## 2020-03-31 DIAGNOSIS — F17210 Nicotine dependence, cigarettes, uncomplicated: Secondary | ICD-10-CM | POA: Diagnosis not present

## 2020-03-31 NOTE — Progress Notes (Signed)
Spoke with patient via Webex video call, used 2 identifiers to correctly identify patient. States that groups are going "pretty good." Had some anxiety when the new people started since the group changes. Feels like her anxiety is getting worse and that her Inderal is not working. Would like to discuss possibility of a new medication. Message sent to NP for discussion.  She has daily panic attacks. On scale 1-10 as 10 being worst she rates depression at 4 and anxiety at 8. Denies SI/HI or AV hallucinations. She did discuss her Adderall causing nausea after she takes it. Suggested she eat a good breakfast before taking her medication to see if this helps. No other issues or complaints.

## 2020-04-01 ENCOUNTER — Encounter (HOSPITAL_COMMUNITY): Payer: Self-pay

## 2020-04-01 ENCOUNTER — Other Ambulatory Visit: Payer: Self-pay

## 2020-04-01 ENCOUNTER — Other Ambulatory Visit (HOSPITAL_COMMUNITY): Payer: BC Managed Care – PPO | Admitting: Licensed Clinical Social Worker

## 2020-04-01 ENCOUNTER — Other Ambulatory Visit (HOSPITAL_COMMUNITY): Payer: BC Managed Care – PPO | Admitting: Specialist

## 2020-04-01 DIAGNOSIS — F909 Attention-deficit hyperactivity disorder, unspecified type: Secondary | ICD-10-CM | POA: Diagnosis not present

## 2020-04-01 DIAGNOSIS — R4589 Other symptoms and signs involving emotional state: Secondary | ICD-10-CM

## 2020-04-01 DIAGNOSIS — F332 Major depressive disorder, recurrent severe without psychotic features: Secondary | ICD-10-CM

## 2020-04-01 DIAGNOSIS — F339 Major depressive disorder, recurrent, unspecified: Secondary | ICD-10-CM | POA: Diagnosis not present

## 2020-04-01 DIAGNOSIS — R45851 Suicidal ideations: Secondary | ICD-10-CM | POA: Diagnosis not present

## 2020-04-01 DIAGNOSIS — F320 Major depressive disorder, single episode, mild: Secondary | ICD-10-CM

## 2020-04-01 DIAGNOSIS — Z558 Other problems related to education and literacy: Secondary | ICD-10-CM | POA: Diagnosis not present

## 2020-04-01 DIAGNOSIS — Z793 Long term (current) use of hormonal contraceptives: Secondary | ICD-10-CM | POA: Diagnosis not present

## 2020-04-01 DIAGNOSIS — F17210 Nicotine dependence, cigarettes, uncomplicated: Secondary | ICD-10-CM | POA: Diagnosis not present

## 2020-04-01 DIAGNOSIS — Z658 Other specified problems related to psychosocial circumstances: Secondary | ICD-10-CM | POA: Diagnosis not present

## 2020-04-01 DIAGNOSIS — Z79899 Other long term (current) drug therapy: Secondary | ICD-10-CM | POA: Diagnosis not present

## 2020-04-01 DIAGNOSIS — F419 Anxiety disorder, unspecified: Secondary | ICD-10-CM | POA: Diagnosis not present

## 2020-04-01 DIAGNOSIS — Z760 Encounter for issue of repeat prescription: Secondary | ICD-10-CM | POA: Diagnosis not present

## 2020-04-01 NOTE — Addendum Note (Signed)
Addended by: Shirlean Mylar on: 04/01/2020 03:06 PM   Modules accepted: Orders

## 2020-04-01 NOTE — BH Assessment (Signed)
Q-Actual   Writer left a voice mail message with the participant in order to complete the PHQ 2-9.   Participant has not completed a wellness check since 03-18-2020.

## 2020-04-01 NOTE — Progress Notes (Signed)
Virtual Visit via Video Note  I connected with Amanda Fowler on 04/01/20 at  9:00 AM EDT by a video enabled telemedicine application and verified that I am speaking with the correct person using two identifiers.   I discussed the limitations of evaluation and management by telemedicine and the availability of in person appointments. The patient expressed understanding and agreed to proceed.   I discussed the assessment and treatment plan with the patient. The patient was provided an opportunity to ask questions and all were answered. The patient agreed with the plan and demonstrated an understanding of the instructions.   The patient was advised to call back or seek an in-person evaluation if the symptoms worsen or if the condition fails to improve as anticipated.  I provided 15 minutes of non-face-to-face time during this encounter.   Derrill Center, NP   [facility] MD/PA/NP OP Progress Note  04/01/2020 11:36 AM Seniya Stoffers  MRN:  [id number]   Evaluation: Amanda Fowler was seen and evaluated via WebEx.  She presents with a bright affect.  Denying suicidal or homicidal ideations.  Denies auditory or visual hallucinations.  Reports she was recently prescribed Abilify 2 mg for mood stabilization.  Taking medications as directed.  Discussed titration to Abilfy for increased anxiety. Patient declined at this time. Rating her depression 5 out of 10 with 10 being the worst.  Her anxiety 7 at 8.  Reports a good appetite.  States she is resting well throughout the night.  Reports she is seeking a new therapist at this time.  Patient is considering transitioning to intensive outpatient programming.  Support, encouragement and  reassurance was provided.  Visit Diagnosis: No diagnosis found.  Past Psychiatric History:   Past Medical History:  Past Medical History:  Diagnosis Date  . ADHD   . Anxiety   . Depression   . Heart murmur   . Migraines    with aura    Past Surgical  History:  Procedure Laterality Date  . BIOPSY THYROID     benign    Family Psychiatric History:   Family History:  Family History  Problem Relation Age of Onset  . Thyroid disease Mother        hypo  . Anxiety disorder Mother   . Depression Mother   . ADD / ADHD Mother   . Aneurysm Maternal Aunt   . Aneurysm Maternal Grandmother   . Anxiety disorder Father   . Depression Father   . OCD Father     Social History:  Social History   Socioeconomic History  . Marital status: Single    Spouse name: Not on file  . Number of children: Not on file  . Years of education: Not on file  . Highest education level: Not on file  Occupational History  . Not on file  Tobacco Use  . Smoking status: Current Some Day Smoker    Types: E-cigarettes  . Smokeless tobacco: Never Used  Substance and Sexual Activity  . Alcohol use: Yes    Alcohol/week: 1.0 standard drinks    Types: 1 Standard drinks or equivalent per week  . Drug use: Yes    Types: Marijuana  . Sexual activity: Yes    Partners: Male    Birth control/protection: I.U.D.    Comment: Mirena 01/2018  Other Topics Concern  . Not on file  Social History Narrative   Pt is a Development worker, community with 4 roommates. Pt has a history of depression and stopped her Effexor  abruptly 4-5 days ago for no reason knowing the negative side effects of this action. Pt identifies her stressor as school related.   Social Determinants of Health   Financial Resource Strain:   . Difficulty of Paying Living Expenses:   Food Insecurity:   . Worried About Programme researcher, broadcasting/film/video in the Last Year:   . Barista in the Last Year:   Transportation Needs:   . Freight forwarder (Medical):   Marland Kitchen Lack of Transportation (Non-Medical):   Physical Activity:   . Days of Exercise per Week:   . Minutes of Exercise per Session:   Stress:   . Feeling of Stress :   Social Connections:   . Frequency of Communication with Friends and Family:   . Frequency of  Social Gatherings with Friends and Family:   . Attends Religious Services:   . Active Member of Clubs or Organizations:   . Attends Banker Meetings:   Marland Kitchen Marital Status:     Allergies:  Allergies  Allergen Reactions  . Amoxicillin Hives    Has patient had a PCN reaction causing immediate rash, facial/tongue/throat swelling, SOB or lightheadedness with hypotension: Yes Has patient had a PCN reaction causing severe rash involving mucus membranes or skin necrosis: Yes Has patient had a PCN reaction that required hospitalization: No Has patient had a PCN reaction occurring within the last 10 years: Unknown If all of the above answers are "NO", then may proceed with Cephalosporin use.     Metabolic Disorder Labs: Lab Results  Component Value Date   HGBA1C 5.2 03/12/2020   MPG 102.54 03/12/2020   No results found for: PROLACTIN Lab Results  Component Value Date   CHOL 281 (H) 03/12/2020   TRIG 147 03/12/2020   HDL 50 03/12/2020   CHOLHDL 5.6 03/12/2020   VLDL 29 03/12/2020   LDLCALC 202 (H) 03/12/2020   LDLCALC 122 (H) 08/09/2018   Lab Results  Component Value Date   TSH 1.391 03/12/2020   TSH 0.71 03/16/2016    Therapeutic Level Labs: No results found for: LITHIUM No results found for: VALPROATE No components found for:  CBMZ  Current Medications: Current Outpatient Medications  Medication Sig Dispense Refill  . amphetamine-dextroamphetamine (ADDERALL) 10 MG tablet Take 10 mg by mouth as needed.    . ARIPiprazole (ABILIFY) 2 MG tablet Take 1 tablet (2 mg total) by mouth daily. As an djunct to Effexor-XR: For depression 30 tablet 0  . levonorgestrel (MIRENA) 20 MCG/24HR IUD 1 each by Intrauterine route once.    . lisdexamfetamine (VYVANSE) 20 MG capsule Take 20 mg by mouth as needed.    . nicotine (NICODERM CQ - DOSED IN MG/24 HOURS) 14 mg/24hr patch Place 1 patch (14 mg total) onto the skin daily. (May buy from over the counter): For smoking cessation  (Patient not taking: Reported on 03/24/2020) 28 patch 0  . propranolol (INDERAL) 10 MG tablet Take 1 tablet (10 mg total) by mouth 3 (three) times daily. For anxiety 45 tablet 1  . traZODone (DESYREL) 50 MG tablet Take 1 tablet (50 mg total) by mouth at bedtime as needed for sleep. (Patient not taking: Reported on 03/31/2020) 30 tablet 0  . venlafaxine XR (EFFEXOR-XR) 150 MG 24 hr capsule Take 1 capsule (150 mg total) by mouth daily with breakfast. 30 capsule 0   No current facility-administered medications for this visit.     Musculoskeletal:   Psychiatric Specialty Exam: Review of Systems  There  were no vitals taken for this visit.There is no height or weight on file to calculate BMI.  General Appearance: Casual  Eye Contact:  Fair  Speech:  Clear and Coherent  Volume:  Normal  Mood:  Anxious and Depressed  Affect:  Congruent  Thought Process:  Coherent  Orientation:  Full (Time, Place, and Person)  Thought Content: Logical   Suicidal Thoughts:  No  Homicidal Thoughts:  No  Memory:  Immediate;   Fair Recent;   Fair  Judgement:  Fair  Insight:  Fair  Psychomotor Activity:  Normal  Concentration:  Concentration: Fair  Recall:  [employer] of Knowledge: Fair  Language: Fair  Akathisia:  No  Handed:  Right  AIMS (if indicated):   Assets:  Communication Skills Desire for Improvement Resilience Social Support  ADL's:  Intact  Cognition: WNL  Sleep:  Fair   Screenings: AIMS     [facility] (Dis[facility]) [facility]  AIMS Total Score  0    AUDIT     Admission (Discharged) from [facility]  Alcohol Use Disorder Identification Test Final Score (AUDIT)  3    PHQ2-9     Counselor from 03/19/2020 in [facility]  PHQ-2 Total Score  2  PHQ-9 Total Score  11       Assessment and Plan:  Amanda Fowler to continue  Partial hospitalization  Continue medication as directed   Treatment plan was reviewed and agreed upon by NP T.Melvyn Neth and patient Amanda Fowler Hoffmans need for group services    Amanda Rack, NP 04/01/2020, 11:36 AM

## 2020-04-01 NOTE — Therapy (Signed)
Virtual Visit via Video Note  I connected with [name] Fass on 04/01/20 at  11:00 AM EDT by a video enabled telemedicine application and verified that I am speaking with the correct person using two identifiers.   I discussed the limitations of evaluation and management by telemedicine and the availability of in person appointments. The patient expressed understanding and agreed to proceed.    I discussed the assessment and treatment plan with the patient. The patient was provided an opportunity to ask questions and all were answered. The patient agreed with the plan and demonstrated an understanding of the instructions.   The patient was advised to call back or seek an in-person evaluation if the symptoms worsen or if the condition fails to improve as anticipated.  I provided 10mnutes of non-face-to-face time during this encounter.     [address] [place] [address]Phone: [phone]  Fax:  [phone] Occupational Therapy Treatment  Patient Details  Name: AAlaisa MoffittMRN: [id number] of Birth: [birth year]Referring Provider (OT): TOren Section  Encounter Date: 04/01/2020  OT End of Session - 04/01/20 1539    Visit Number  2    Number of Visits  8    Date for OT Re-Evaluation  04/21/20    Authorization Type  This pt's benefits are as follows: [employer]BS EFF 12/27/2017 $40.00 CO-PAY NO DED NO CO-INS OOP MAX $3,250.00/ $930.81 MET 60 COMB VISIT LIMIT PT/OT/ST/ 0 USED NO AUTH REQ S/W [id number]   Authorization - Visit Number  2    Authorization - Number of Visits  60    Progress Note Due on Visit  10    OT Start Time  1100    OT Stop Time  1159    OT Time Calculation (min)  59 min    Activity Tolerance  Patient tolerated treatment well    Behavior During Therapy  WFL for tasks assessed/performed       Past Medical History:  Diagnosis Date  . ADHD   . Anxiety    . Depression   . Heart murmur   . Migraines    with aura    Past Surgical History:  Procedure Laterality Date  . BIOPSY THYROID     benign    There were no vitals filed for this visit.  Subjective Assessment - 04/01/20 1539    Currently in Pain?  No/denies            S:  I made up this quote for my self esteem:  Nobody cares as much as you think they do." O:  Patient participated in skilled occupational therapy group this day focusing on improving self-esteem.  At opening of group, members shared one of their proudest accomplishments, as well as the state of their current self-esteem.  Group was educated on definition of self-esteem, high and low self-esteem, Causes of low self-esteem, including past experiences, social media, isolation, health, other peoples viewpoints, relationships.  Group discussed the close knit relationship of low self-esteem and mental health problems.  Group was then educated on ways to improve self-esteem, including doing tasks that provide enjoyment, work, hobbies, involvement in positive relationships, assertiveness, physical activity, sleep, diet, challenging self, positive thinking.  Group also acknowledged that we must determine the root cause of our negative beliefs and challenge those beliefs as a first step towards improving self-esteem.  Other tools the group learned were focusing on the positive,  fact checking, and learning self-compassion.  Group concluded with patient sharing one new strategy they will implement to build their self-esteem.   A:  Patient was engaged in group this date.  Patient shared that she used to be caught up with what others thought and she doesn't do that so much anymore.  She reflected that she is absolutely respectful and hopeful.  Patient reflected that she is going to be more honest with her friends about her current emotional state in order to build her self esteem.  P:  Continue skilled OT group 2 times per week for 3  weeks, next group session will focus on improving assertiveness for improved communication, self-esteem, and independence.                  OT Education - 04/01/20 1539    Education Details  tips to improve self esteem    Person(s) Educated  Patient    Methods  Explanation    Comprehension  Verbalized understanding       OT Short Term Goals - 03/24/20 1433      OT SHORT TERM GOAL #1   Title  Pt will apply psychosocial skills and coping mechanisms to daily activities in order to function independently and reintegrate into community    Time  4    Period  Weeks    Status  New    Target Date  04/21/20      OT SHORT TERM GOAL #2   Title  Pt will create and implement functional BADL/IADL routine upon reintegrating into the community for increased engagement in all daily, work, and leisure activities    Time  4    Period  Weeks    Status  New      OT SHORT TERM GOAL #3   Title  Pt will engage in goal setting to improve functional BADL/IADL routine upon reintegrating into community.    Time  4    Period  Weeks    Status  New      OT SHORT TERM GOAL #4   Title  Pt will recall and/or apply 1-3 sleep hygiene strategies to improve function in BADL routine upon reintegrating into community    Time  4    Period  Weeks    Status  New               Plan - 04/01/20 1540    Body Structure / Function / Physical Skills  ADL    Cognitive Skills  Attention;Energy/Drive;Learn    Psychosocial Skills  Coping Strategies;Habits;Routines and Behaviors       Patient will benefit from skilled therapeutic intervention in order to improve the following deficits and impairments:   Body Structure / Function / Physical Skills: ADL Cognitive Skills: Attention, Energy/Drive, Learn Psychosocial Skills: Coping Strategies, Habits, Routines and Behaviors   Visit Diagnosis: Difficulty coping  MDD (major depressive disorder), recurrent severe, without psychosis ([facility])  Severe  episode of recurrent major depressive disorder, without psychotic features ([place])    Problem List Patient Active Problem List   Diagnosis Date Noted  . MDD (major depressive disorder), recurrent severe, without psychosis ([place]) 03/11/2020  . Menstrual migraine 08/31/2013  . Menorrhagia 08/31/2013    Vangie Bicker, [place], OTR/L [phone] 04/01/2020, 3:40 PM  [facility] PARTIAL HOSPITALIZATION PROGRAM [place], [place], [address] Phone: [phone]   Fax:  [phone]  Name: Mylia Pondexter MRN: [id number] Date of Birth: 26-Aug-1996

## 2020-04-02 ENCOUNTER — Other Ambulatory Visit: Payer: Self-pay

## 2020-04-02 ENCOUNTER — Encounter (HOSPITAL_COMMUNITY): Payer: Self-pay | Admitting: Family

## 2020-04-02 ENCOUNTER — Other Ambulatory Visit (HOSPITAL_COMMUNITY): Payer: BC Managed Care – PPO | Admitting: Licensed Clinical Social Worker

## 2020-04-02 DIAGNOSIS — Z658 Other specified problems related to psychosocial circumstances: Secondary | ICD-10-CM | POA: Diagnosis not present

## 2020-04-02 DIAGNOSIS — Z79899 Other long term (current) drug therapy: Secondary | ICD-10-CM | POA: Diagnosis not present

## 2020-04-02 DIAGNOSIS — R45851 Suicidal ideations: Secondary | ICD-10-CM | POA: Diagnosis not present

## 2020-04-02 DIAGNOSIS — Z558 Other problems related to education and literacy: Secondary | ICD-10-CM | POA: Diagnosis not present

## 2020-04-02 DIAGNOSIS — F339 Major depressive disorder, recurrent, unspecified: Secondary | ICD-10-CM | POA: Diagnosis not present

## 2020-04-02 DIAGNOSIS — Z760 Encounter for issue of repeat prescription: Secondary | ICD-10-CM | POA: Diagnosis not present

## 2020-04-02 DIAGNOSIS — Z793 Long term (current) use of hormonal contraceptives: Secondary | ICD-10-CM | POA: Diagnosis not present

## 2020-04-02 DIAGNOSIS — F419 Anxiety disorder, unspecified: Secondary | ICD-10-CM | POA: Diagnosis not present

## 2020-04-02 DIAGNOSIS — F332 Major depressive disorder, recurrent severe without psychotic features: Secondary | ICD-10-CM

## 2020-04-02 DIAGNOSIS — F17210 Nicotine dependence, cigarettes, uncomplicated: Secondary | ICD-10-CM | POA: Diagnosis not present

## 2020-04-02 DIAGNOSIS — F909 Attention-deficit hyperactivity disorder, unspecified type: Secondary | ICD-10-CM | POA: Diagnosis not present

## 2020-04-02 NOTE — Psych (Signed)
Virtual Visit via Video Note  I connected with Amanda Chappelle on 03/19/20 at  9:00 AM EDT by a video enabled telemedicine application and verified that I am speaking with the correct person using two identifiers.   I discussed the limitations of evaluation and management by telemedicine and the availability of in person appointments. The patient expressed understanding and agreed to proceed.  I discussed the assessment and treatment plan with the patient. The patient was provided an opportunity to ask questions and all were answered. The patient agreed with the plan and demonstrated an understanding of the instructions.   The patient was advised to call back or seek an in-person evaluation if the symptoms worsen or if the condition fails to improve as anticipated.  Pt was provided 120 minutes of non-face-to-face time during this encounter.   [name], LCSW    [facility] PHP THERAPIST PROGRESS NOTE  Makinzee Durley [id number]  Session Time: 9:00 - 10:00  Participation Level: Active  Behavioral Response: CasualAlertDepressed  Type of Therapy: Group Therapy  Treatment Goals addressed: Coping  Interventions: CBT, DBT, Supportive and Reframing  Summary:  Clinician led check-in regarding current stressors and situation, and review of patient completed daily inventory. Clinician utilized active listening and empathetic response and validated patient emotions. Clinician facilitated processing group on pertinent issues.   Therapist Response:Amanda Fowler is a 24 y.o. female who presents with depression and anxiety symptoms. Patient arrived within time allowed and reports that she is feeling "blah." Patient rates hermood at a 5.5on a scale of 1-10 with 10 being great. Pt reports she received her second COIVD vaccine shot yesterday and is feeling mild flu symptoms. Pt states it affected her yesterday and is still doing so today. Pt states she spent time with her  boyfriend and his family yesterday however felt altered due to feeling ill. Pt states having a panic attack later in the evening due to worry about school. Pt states she took her medication about 6-7 hours late yesterday. Pt reports struggling with managing anxiety. Pt is able to process. Patient engaged in discussion.      Session Time: 10:00 -11:00  Participation Level:Active  Behavioral Response:CasualAlertDepressed  Type of Therapy: Group Therapy, psychoeducation, psychotherapy  Treatment Goals addressed: Coping  Interventions:CBT, DBT, Solution Focused, Supportive and Reframing  Summary:Cln led discussion on ways to utilize creativity for coping. Cln introduced the ways in which creativity can benefit our processing. Group discussed creative outlets they have or would like to harness and how to remove barriers to practicing it.   Therapist Response: Pt engaged in discussion and identifies painting and drawing as creative outlets they can utilize. Pt reports barrier to practice as not having the supplies out. Pt able to problem-solve with group.   *Pt left group approximately 11:00. Cln followed up with pt who states she fell asleep.    Suicidal/Homicidal: Nowithout intent/plan  Plan: Pt will continue in PHP while working to decrease depression and anxiety symptoms, increase daily functioning, and increase ability to manage symptoms as they arise.   Diagnosis: MDD (major depressive disorder), recurrent severe, without psychosis (HCC) [F33.2]    1. MDD (major depressive disorder), recurrent severe, without psychosis (HCC)       [name], LCSW 04/02/2020

## 2020-04-02 NOTE — Psych (Signed)
Virtual Visit via Video Note  I connected with Amanda Fowler on 03/20/20 at  9:00 AM EDT by a video enabled telemedicine application and verified that I am speaking with the correct person using two identifiers.   I discussed the limitations of evaluation and management by telemedicine and the availability of in person appointments. The patient expressed understanding and agreed to proceed.  I discussed the assessment and treatment plan with the patient. The patient was provided an opportunity to ask questions and all were answered. The patient agreed with the plan and demonstrated an understanding of the instructions.   The patient was advised to call back or seek an in-person evaluation if the symptoms worsen or if the condition fails to improve as anticipated.  Pt was provided 240 minutes of non-face-to-face time during this encounter.   [name], LCSW    [facility] PHP THERAPIST PROGRESS NOTE  Amanda Fowler [id number]  Session Time: 9:00 - 10:00  Participation Level: Active  Behavioral Response: CasualAlertDepressed  Type of Therapy: Group Therapy  Treatment Goals addressed: Coping  Interventions: CBT, DBT, Supportive and Reframing  Summary:  Clinician led check-in regarding current stressors and situation, and review of patient completed daily inventory. Clinician utilized active listening and empathetic response and validated patient emotions. Clinician facilitated processing group on pertinent issues.   Therapist Response:Amanda Steinhart is a 24 y.o. female who presents with depression and anxiety symptoms. Patient arrived within time allowed and reports that she is feeling "sad." Patient rates hermood at a 5on a scale of 1-10 with 10 being great. Pt reports she was going to see her grandparents for the first time since COVID restrictions and had to cancel and is disappointed. Pt states concern her impulsivity is increasing and reports struggling  with over-thinking. Pt is able to process. Patient engaged in discussion.      SSession Time: 10:00-11:00  Participation Level:Active  Behavioral Response:CasualAlertDepressed  Type of Therapy: Group Therapy  Treatment Goals addressed: Coping  Interventions:CBT, DBT, Solution Focused, Supportive and Reframing  Summary:Cln led discussion on "retail therapy." Group members shared struggles with buying things when not feeling well and the impact it may have. Cln discussed shopping as a coping skill and synthesizes topics of impulse control, reframing perception, and distress tolerance. Group members brainstormed tips and tricks to manage their shopping habits and ways to extend grace to themselves about this coping skill.   Therapist Response: Pt engaged in discussion and reports struggling with shopping when upset or sad. Pt identfies she can try window shopping and budget shopping to decrease negative impact of shopping as a coping skill.       Session Time: 11:00- 12:00  Participation Level:Active  Behavioral Response:CasualAlertDepressed  Type of Therapy: Group Therapy  Treatment Goals addressed: Coping  Interventions:CBT, DBT, Solution Focused, Supportive and Reframing  Summary:Cln continued topic of cognitive distortions. Group reviewed examples of distortions and shared how they have "caught" their own distortions. Cln introduced the "challenge" component of behavior modification model "catch-challenge-change." Cln utilized handout "Socratic Questions" to aid discussion of how to bring logic into distorted thinking.   Therapist Response:  Pt engaged in discussion and reports understanding of how to challenge distorted thinking.       Session Time: 12:00 -1:00  Participation Level:Active  Behavioral Response:CasualAlertDepressed  Type of Therapy: Group therapy  Treatment Goals addressed: Coping  Interventions:CBT;  Solution focused; Supportive; Reframing  Summary:12:00 - 12:50:Cln continued topic of cognitive distortions and led "challenge" practice. Group members provided examples of  distorted thoughts and group worked together to challenge them and reframe perspective.  12:50 -1:00 Clinician led check-out. Clinician assessed for immediate needs, medication compliance and efficacy, and safety concerns  Therapist Response:12:00 - 12:50: Pt engaged in activity and discussion. Pt successfully challenged examples with group.  12:50 - 1:00: At check-out, patientrates hermood at a 7on a scale of 1-10 with 10 being great.Pt states afternoon plans of moving back to her apartment. Patient demonstratessomeprogress as evidenced by utilizing crafts as coping skill.Patient denies SI/HI/self-harm at the end of group.   Suicidal/Homicidal: Nowithout intent/plan  Plan: Pt will continue in PHP while working to decrease depression and anxiety symptoms, increase daily functioning, and increase ability to manage symptoms as they arise.   Diagnosis: MDD (major depressive disorder), recurrent severe, without psychosis ([place]) [F33.2]    1. MDD (major depressive disorder), recurrent severe, without psychosis ([place])       [name], LCSW 04/02/2020

## 2020-04-02 NOTE — Progress Notes (Addendum)
Pt attended spiritual care group    04/02/2020 11:00-12:00.  Group met via web-ex due to COVID-19 precautions.  Group facilitated by Simone Curia, MDiv, [place]   Group focused on topic of "community."  Members reflected on topic in facilitated dialog, identifying responses to topic and notions they hold of community from their previous experience.  Group members utilized value sort cards to identify top qualities they look for in community.  Engaged in facilitated dialog around their value choices, noting origin of these values, how these are realized in their lives, and strategies for engaging these values.    Spiritual care group drew on Motivational Interviewing, Narrative and Adlerian modalities.  Amanda Fowler was present throughout group. 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  Amanda Fowler chose "genuiness, contribution, faithfulness, compassion, comfort, and caring" as core values in community.  Spoke of previously valuing what others thought of her, but since university, has become more invested in her own happiness.

## 2020-04-03 ENCOUNTER — Other Ambulatory Visit (HOSPITAL_COMMUNITY): Payer: BC Managed Care – PPO | Admitting: Licensed Clinical Social Worker

## 2020-04-03 ENCOUNTER — Other Ambulatory Visit (HOSPITAL_COMMUNITY): Payer: BC Managed Care – PPO

## 2020-04-03 ENCOUNTER — Other Ambulatory Visit: Payer: Self-pay

## 2020-04-03 DIAGNOSIS — R45851 Suicidal ideations: Secondary | ICD-10-CM | POA: Diagnosis not present

## 2020-04-03 DIAGNOSIS — F332 Major depressive disorder, recurrent severe without psychotic features: Secondary | ICD-10-CM

## 2020-04-03 DIAGNOSIS — Z658 Other specified problems related to psychosocial circumstances: Secondary | ICD-10-CM | POA: Diagnosis not present

## 2020-04-03 DIAGNOSIS — Z558 Other problems related to education and literacy: Secondary | ICD-10-CM | POA: Diagnosis not present

## 2020-04-03 DIAGNOSIS — F909 Attention-deficit hyperactivity disorder, unspecified type: Secondary | ICD-10-CM | POA: Diagnosis not present

## 2020-04-03 DIAGNOSIS — Z79899 Other long term (current) drug therapy: Secondary | ICD-10-CM | POA: Diagnosis not present

## 2020-04-03 DIAGNOSIS — F17210 Nicotine dependence, cigarettes, uncomplicated: Secondary | ICD-10-CM | POA: Diagnosis not present

## 2020-04-03 DIAGNOSIS — Z793 Long term (current) use of hormonal contraceptives: Secondary | ICD-10-CM | POA: Diagnosis not present

## 2020-04-03 DIAGNOSIS — Z760 Encounter for issue of repeat prescription: Secondary | ICD-10-CM | POA: Diagnosis not present

## 2020-04-03 DIAGNOSIS — F339 Major depressive disorder, recurrent, unspecified: Secondary | ICD-10-CM | POA: Diagnosis not present

## 2020-04-03 DIAGNOSIS — F419 Anxiety disorder, unspecified: Secondary | ICD-10-CM | POA: Diagnosis not present

## 2020-04-04 ENCOUNTER — Encounter (HOSPITAL_COMMUNITY): Payer: Self-pay

## 2020-04-04 ENCOUNTER — Other Ambulatory Visit: Payer: Self-pay

## 2020-04-04 ENCOUNTER — Other Ambulatory Visit (HOSPITAL_COMMUNITY): Payer: BC Managed Care – PPO | Admitting: Specialist

## 2020-04-04 ENCOUNTER — Other Ambulatory Visit (HOSPITAL_COMMUNITY): Payer: BC Managed Care – PPO | Admitting: Licensed Clinical Social Worker

## 2020-04-04 DIAGNOSIS — F17210 Nicotine dependence, cigarettes, uncomplicated: Secondary | ICD-10-CM | POA: Diagnosis not present

## 2020-04-04 DIAGNOSIS — Z79899 Other long term (current) drug therapy: Secondary | ICD-10-CM | POA: Diagnosis not present

## 2020-04-04 DIAGNOSIS — Z558 Other problems related to education and literacy: Secondary | ICD-10-CM | POA: Diagnosis not present

## 2020-04-04 DIAGNOSIS — R4589 Other symptoms and signs involving emotional state: Secondary | ICD-10-CM

## 2020-04-04 DIAGNOSIS — Z793 Long term (current) use of hormonal contraceptives: Secondary | ICD-10-CM | POA: Diagnosis not present

## 2020-04-04 DIAGNOSIS — F339 Major depressive disorder, recurrent, unspecified: Secondary | ICD-10-CM | POA: Diagnosis not present

## 2020-04-04 DIAGNOSIS — F332 Major depressive disorder, recurrent severe without psychotic features: Secondary | ICD-10-CM

## 2020-04-04 DIAGNOSIS — R45851 Suicidal ideations: Secondary | ICD-10-CM | POA: Diagnosis not present

## 2020-04-04 DIAGNOSIS — F320 Major depressive disorder, single episode, mild: Secondary | ICD-10-CM

## 2020-04-04 DIAGNOSIS — F419 Anxiety disorder, unspecified: Secondary | ICD-10-CM | POA: Diagnosis not present

## 2020-04-04 DIAGNOSIS — Z658 Other specified problems related to psychosocial circumstances: Secondary | ICD-10-CM | POA: Diagnosis not present

## 2020-04-04 DIAGNOSIS — F909 Attention-deficit hyperactivity disorder, unspecified type: Secondary | ICD-10-CM | POA: Diagnosis not present

## 2020-04-04 DIAGNOSIS — Z760 Encounter for issue of repeat prescription: Secondary | ICD-10-CM | POA: Diagnosis not present

## 2020-04-04 NOTE — Therapy (Signed)
Virtual Visit via Video Note  I connected with Amanda Fowler on 04/04/20 at  11:00 AM EDT by a video enabled telemedicine application and verified that I am speaking with the correct person using two identifiers.   I discussed the limitations of evaluation and management by telemedicine and the availability of in person appointments. The patient expressed understanding and agreed to proceed.  I discussed the assessment and treatment plan with the patient. The patient was provided an opportunity to ask questions and all were answered. The patient agreed with the plan and demonstrated an understanding of the instructions.   The patient was advised to call back or seek an in-person evaluation if the symptoms worsen or if the condition fails to improve as anticipated.  I provided 60 minutes of non-face-to-face time during this encounter.     [place], [place], [address] Phone: [phone]   Fax:  [phone]  Occupational Therapy Treatment  Patient Details  Name: Amanda Fowler MRN: [id number] Date of Birth: 09/15/1996 Referring Provider (OT): Oren Section   Encounter Date: 04/04/2020  OT End of Session - 04/04/20 1354    Visit Number  3    Number of Visits  8    Date for OT Re-Evaluation  04/21/20    Authorization Type  This pt's benefits are as follows: [employer]BS EFF 12/27/2017 $40.00 CO-PAY NO DED NO CO-INS OOP MAX $3,250.00/ $930.81 MET 60 COMB VISIT LIMIT PT/OT/ST/ 0 USED NO AUTH REQ S/W Amanda Fowler [id number]#[id number]    Authorization - Visit Number  3    Authorization - Number of Visits  60    Progress Note Due on Visit  10    OT Start Time  1100    OT Stop Time  1200    OT Time Calculation (min)  60 min    Activity Tolerance  Patient tolerated treatment well    Behavior During Therapy  WFL for tasks assessed/performed       Past Medical History:  Diagnosis Date  . ADHD   . Anxiety    . Depression   . Heart murmur   . Migraines    with aura    Past Surgical History:  Procedure Laterality Date  . BIOPSY THYROID     benign    There were no vitals filed for this visit.  Subjective Assessment - 04/04/20 1354    Currently in Pain?  No/denies        S:  I dont think I could use the xyz statement in a work setting or a meeting.     Group began with reflection on self esteem strategies from previous session..  Group members shared their reflections and progress with use of this coping tool.  Group focus today was on communication tool of assertiveness.  Group completed an assertive quiz, which identified if they tended to be assertive all of the time, often, occasionally, or never.  Group members reflected on whether their score correlated with their beliefs in their actions.  Group explored connotation of assertiveness and determined it was almost always a positive trait.  Group was educated on benefits of being assertive, including:  self esteem, goal achievement, anxiety reduction, development of mutual respect for others, improved decision making and empowerment, and improved self expression.  Group reflected on roadblocks they experience that prevents them from being assertive, including lack of confidence, fearful of coming across as aggressive, and being too emotional to do so.  Patient was educated on definition of passive, assertive, and aggressive communication.  Benefits and drawbacks of each communication style, nonverbal communication/body language associated with each communication style.  Tips for communicating assertively were shared with group, including:  use of I statements, clearly stating your needs, non verbal skills, self respect, speaking calmly, planning and rehearsing conversation/email/text response, scheduling time to respond to an unplanned conversation that causes emotional response/trigger, and use of the x/y/z formula.  The x y z formula uses the  following formula:   I feel X when you do Y in situation Z and I would like _________.  (X = emotion, Y = specific behavior , Z = specific situation).  Each member then formulated a xyz statement for a particular situation they are currently encountering and shared with the group.  Group and OT provided feedback on components being present or not in statement.  Group also discussed and were educated on strategies to assertively sayi no; including no as a sentence, assertively refusing requests, assertive ways of saying no, and steps in learning to say no.   A:  Patient was engaged throughout group.  Amanda Fowler stated that the xyz statement would be difficult in a meeting setting as she would be received as whiny.  OT encouraged her to look it at in the lense of standing up for her needs, ie "I feel overlooked when my project is not discussed during our weekly meetings.  Can we add this project as a standing agenda item.", after this education Amanda Fowler stated she felt more comfortable with the use of this tool.  Amanda Fowler created a xyz statement:  I feel incovenienced when you dont come to my house for long periods of time.  I dont feel my love language is being addressed.  With cuing from OT, Amanda Fowler was able to identify that she was missing a request and then added I would like for you to visit my house 2 times a week and I can visit your house 2 times a week. P:  Continue skilled OT intervention with next session focusing on sleep hygiene.               OT Education - 04/04/20 1354    Education Details  educated on strategies to improve assertive communication skills    Person(s) Educated  Patient    Methods  Explanation    Comprehension  Verbalized understanding       OT Short Term Goals - 04/04/20 1355      OT SHORT TERM GOAL #1   Title  Pt will apply psychosocial skills and coping mechanisms to daily activities in order to function independently and reintegrate into community    Time  4    Period  Weeks     Status  On-going    Target Date  04/21/20      OT SHORT TERM GOAL #2   Title  Pt will create and implement functional BADL/IADL routine upon reintegrating into the community for increased engagement in all daily, work, and leisure activities    Time  4    Period  Weeks    Status  On-going      OT SHORT TERM GOAL #3   Title  Pt will engage in goal setting to improve functional BADL/IADL routine upon reintegrating into community.    Time  4    Period  Weeks    Status  On-going      OT SHORT TERM GOAL #4   Title  Pt will recall and/or apply 1-3 sleep hygiene strategies to improve function in BADL routine upon reintegrating into community    Time  4    Period  Weeks    Status  On-going               Plan - 04/04/20 1355    Body Structure / Function / Physical Skills  ADL    Cognitive Skills  Attention;Energy/Drive;Learn    Psychosocial Skills  Coping Strategies;Habits;Routines and Behaviors       Patient will benefit from skilled therapeutic intervention in order to improve the following deficits and impairments:   Body Structure / Function / Physical Skills: ADL Cognitive Skills: Attention, Energy/Drive, Learn Psychosocial Skills: Coping Strategies, Habits, Routines and Behaviors   Visit Diagnosis: Current mild episode of major depressive disorder, unspecified whether recurrent (HCC)  Difficulty coping  MDD (major depressive disorder), recurrent severe, without psychosis ([place])    Problem List Patient Active Problem List   Diagnosis Date Noted  . MDD (major depressive disorder), recurrent severe, without psychosis ([place]) 03/11/2020  . Menstrual migraine 08/31/2013  . Menorrhagia 08/31/2013    Vangie Bicker, [place], OTR/L [phone]  04/04/2020, 1:56 PM  [facility] PARTIAL HOSPITALIZATION PROGRAM [place], [place], [address] Phone: [phone]   Fax:  [phone]  Name: Amanda Fowler MRN:  [id number] Date of Birth: 24-May-1996

## 2020-04-07 ENCOUNTER — Other Ambulatory Visit: Payer: Self-pay

## 2020-04-07 ENCOUNTER — Other Ambulatory Visit (HOSPITAL_COMMUNITY): Payer: BC Managed Care – PPO | Admitting: Licensed Clinical Social Worker

## 2020-04-07 DIAGNOSIS — F17210 Nicotine dependence, cigarettes, uncomplicated: Secondary | ICD-10-CM | POA: Diagnosis not present

## 2020-04-07 DIAGNOSIS — Z793 Long term (current) use of hormonal contraceptives: Secondary | ICD-10-CM | POA: Diagnosis not present

## 2020-04-07 DIAGNOSIS — F332 Major depressive disorder, recurrent severe without psychotic features: Secondary | ICD-10-CM

## 2020-04-07 DIAGNOSIS — Z558 Other problems related to education and literacy: Secondary | ICD-10-CM | POA: Diagnosis not present

## 2020-04-07 DIAGNOSIS — Z79899 Other long term (current) drug therapy: Secondary | ICD-10-CM | POA: Diagnosis not present

## 2020-04-07 DIAGNOSIS — F909 Attention-deficit hyperactivity disorder, unspecified type: Secondary | ICD-10-CM | POA: Diagnosis not present

## 2020-04-07 DIAGNOSIS — R45851 Suicidal ideations: Secondary | ICD-10-CM | POA: Diagnosis not present

## 2020-04-07 DIAGNOSIS — F419 Anxiety disorder, unspecified: Secondary | ICD-10-CM | POA: Diagnosis not present

## 2020-04-07 DIAGNOSIS — Z658 Other specified problems related to psychosocial circumstances: Secondary | ICD-10-CM | POA: Diagnosis not present

## 2020-04-07 DIAGNOSIS — Z760 Encounter for issue of repeat prescription: Secondary | ICD-10-CM | POA: Diagnosis not present

## 2020-04-07 DIAGNOSIS — F339 Major depressive disorder, recurrent, unspecified: Secondary | ICD-10-CM | POA: Diagnosis not present

## 2020-04-08 ENCOUNTER — Other Ambulatory Visit (HOSPITAL_COMMUNITY): Payer: BC Managed Care – PPO | Admitting: Licensed Clinical Social Worker

## 2020-04-08 ENCOUNTER — Other Ambulatory Visit (HOSPITAL_COMMUNITY): Payer: BC Managed Care – PPO | Admitting: Specialist

## 2020-04-08 ENCOUNTER — Other Ambulatory Visit: Payer: Self-pay

## 2020-04-08 ENCOUNTER — Encounter (HOSPITAL_COMMUNITY): Payer: Self-pay

## 2020-04-08 DIAGNOSIS — F419 Anxiety disorder, unspecified: Secondary | ICD-10-CM | POA: Diagnosis not present

## 2020-04-08 DIAGNOSIS — Z658 Other specified problems related to psychosocial circumstances: Secondary | ICD-10-CM | POA: Diagnosis not present

## 2020-04-08 DIAGNOSIS — F339 Major depressive disorder, recurrent, unspecified: Secondary | ICD-10-CM | POA: Diagnosis not present

## 2020-04-08 DIAGNOSIS — Z793 Long term (current) use of hormonal contraceptives: Secondary | ICD-10-CM | POA: Diagnosis not present

## 2020-04-08 DIAGNOSIS — F332 Major depressive disorder, recurrent severe without psychotic features: Secondary | ICD-10-CM

## 2020-04-08 DIAGNOSIS — R45851 Suicidal ideations: Secondary | ICD-10-CM | POA: Diagnosis not present

## 2020-04-08 DIAGNOSIS — F17210 Nicotine dependence, cigarettes, uncomplicated: Secondary | ICD-10-CM | POA: Diagnosis not present

## 2020-04-08 DIAGNOSIS — Z558 Other problems related to education and literacy: Secondary | ICD-10-CM | POA: Diagnosis not present

## 2020-04-08 DIAGNOSIS — R4589 Other symptoms and signs involving emotional state: Secondary | ICD-10-CM

## 2020-04-08 DIAGNOSIS — Z760 Encounter for issue of repeat prescription: Secondary | ICD-10-CM | POA: Diagnosis not present

## 2020-04-08 DIAGNOSIS — Z79899 Other long term (current) drug therapy: Secondary | ICD-10-CM | POA: Diagnosis not present

## 2020-04-08 DIAGNOSIS — F320 Major depressive disorder, single episode, mild: Secondary | ICD-10-CM

## 2020-04-08 DIAGNOSIS — F909 Attention-deficit hyperactivity disorder, unspecified type: Secondary | ICD-10-CM | POA: Diagnosis not present

## 2020-04-08 NOTE — Therapy (Signed)
Virtual Visit via Video Note  I connected with Amanda Fowler on 04/08/20 at  11:00 AM EDT by a video enabled telemedicine application and verified that I am speaking with the correct person using two identifiers.   I discussed the limitations of evaluation and management by telemedicine and the availability of in person appointments. The patient expressed understanding and agreed to proceed.    I discussed the assessment and treatment plan with the patient. The patient was provided an opportunity to ask questions and all were answered. The patient agreed with the plan and demonstrated an understanding of the instructions.   The patient was advised to call back or seek an in-person evaluation if the symptoms worsen or if the condition fails to improve as anticipated.  I provided 60 minutes of non-face-to-face time during this encounter.     [place], [place], [address] Phone: [phone]   Fax:  [phone]  Occupational Therapy Treatment  Patient Details  Name: Kiira Brach MRN: [id number] Date of Birth: 02-26-1996 Referring Provider (OT): Oren Section   Encounter Date: 04/08/2020  OT End of Session - 04/08/20 1352    Visit Number  4    Number of Visits  8    Date for OT Re-Evaluation  04/21/20    Authorization Type  This pt's benefits are as follows: [employer] EFF 12/27/2017 $40.00 CO-PAY NO DED NO CO-INS OOP MAX $3,250.00/ $930.81 MET 60 COMB VISIT LIMIT PT/OT/ST/ 0 USED NO AUTH REQ S/W M[place] VXB#[id number]    Authorization - Visit Number  4    Authorization - Number of Visits  60    Progress Note Due on Visit  10    OT Start Time  1100    OT Stop Time  1200    OT Time Calculation (min)  60 min    Activity Tolerance  Patient tolerated treatment well    Behavior During Therapy  WFL for tasks assessed/performed       Past Medical History:  Diagnosis Date  . ADHD   . Anxiety    . Depression   . Heart murmur   . Migraines    with aura    Past Surgical History:  Procedure Laterality Date  . BIOPSY THYROID     benign    There were no vitals filed for this visit.  Subjective Assessment - 04/08/20 1352    Currently in Pain?  No/denies         S:  I use sleep apps like calm, they are really helpful.  I usually sleep 6 hours per night.  O: followed up on assertive communications from previous group and patient confirmed use of xyz statement with favorable outcome.  patient participated in skilled OT group focusing on improving sleep hygiene.  Patient was educated and discussed benefits of getting enough sleep, detriments of getting too much or too little sleep.  Patient and group discussed strategies for improving sleep including routines, environmental factors, diet, exercises, calming activities.  Patient was educated on use of sleep diary to identify current sleep patterns and strategies to improve sleep routine.   A:  Patient identified one new sleep hygiene technique to complete at home, which is to stop using her phone in her bed. P:  continue skilled OT group 2 times per week for 3 weeks.                   OT Education -  04/08/20 1352    Education Details  educated on sleep hygiene strategies    Person(s) Educated  Patient    Methods  Explanation    Comprehension  Verbalized understanding       OT Short Term Goals - 04/04/20 1355      OT SHORT TERM GOAL #1   Title  Pt will apply psychosocial skills and coping mechanisms to daily activities in order to function independently and reintegrate into community    Time  4    Period  Weeks    Status  On-going    Target Date  04/21/20      OT SHORT TERM GOAL #2   Title  Pt will create and implement functional BADL/IADL routine upon reintegrating into the community for increased engagement in all daily, work, and leisure activities    Time  4    Period  Weeks    Status  On-going       OT SHORT TERM GOAL #3   Title  Pt will engage in goal setting to improve functional BADL/IADL routine upon reintegrating into community.    Time  4    Period  Weeks    Status  On-going      OT SHORT TERM GOAL #4   Title  Pt will recall and/or apply 1-3 sleep hygiene strategies to improve function in BADL routine upon reintegrating into community    Time  4    Period  Weeks    Status  On-going               Plan - 04/08/20 1353    Body Structure / Function / Physical Skills  ADL    Cognitive Skills  Attention;Energy/Drive;Learn    Psychosocial Skills  Coping Strategies;Habits;Routines and Behaviors       Patient will benefit from skilled therapeutic intervention in order to improve the following deficits and impairments:   Body Structure / Function / Physical Skills: ADL Cognitive Skills: Attention, Energy/Drive, Learn Psychosocial Skills: Coping Strategies, Habits, Routines and Behaviors   Visit Diagnosis: Current mild episode of major depressive disorder, unspecified whether recurrent ([place])  Difficulty coping    Problem List Patient Active Problem List   Diagnosis Date Noted  . MDD (major depressive disorder), recurrent severe, without psychosis ([place]) 03/11/2020  . Menstrual migraine 08/31/2013  . Menorrhagia 08/31/2013    Vangie Bicker, [place], OTR/L [phone]  04/08/2020, 1:54 PM  [place], [place], [address] Phone: [phone]   Fax:  [phone]  Name: Atianna Haidar MRN: [id number] Date of Birth: Dec 08, 1996

## 2020-04-09 ENCOUNTER — Other Ambulatory Visit: Payer: Self-pay

## 2020-04-09 ENCOUNTER — Encounter (HOSPITAL_COMMUNITY): Payer: Self-pay | Admitting: Family

## 2020-04-09 ENCOUNTER — Other Ambulatory Visit (HOSPITAL_COMMUNITY): Payer: BC Managed Care – PPO | Admitting: Licensed Clinical Social Worker

## 2020-04-09 DIAGNOSIS — F339 Major depressive disorder, recurrent, unspecified: Secondary | ICD-10-CM | POA: Diagnosis not present

## 2020-04-09 DIAGNOSIS — R45851 Suicidal ideations: Secondary | ICD-10-CM | POA: Diagnosis not present

## 2020-04-09 DIAGNOSIS — Z79899 Other long term (current) drug therapy: Secondary | ICD-10-CM | POA: Diagnosis not present

## 2020-04-09 DIAGNOSIS — Z760 Encounter for issue of repeat prescription: Secondary | ICD-10-CM | POA: Diagnosis not present

## 2020-04-09 DIAGNOSIS — F419 Anxiety disorder, unspecified: Secondary | ICD-10-CM | POA: Diagnosis not present

## 2020-04-09 DIAGNOSIS — Z793 Long term (current) use of hormonal contraceptives: Secondary | ICD-10-CM | POA: Diagnosis not present

## 2020-04-09 DIAGNOSIS — F332 Major depressive disorder, recurrent severe without psychotic features: Secondary | ICD-10-CM

## 2020-04-09 DIAGNOSIS — Z658 Other specified problems related to psychosocial circumstances: Secondary | ICD-10-CM | POA: Diagnosis not present

## 2020-04-09 DIAGNOSIS — F909 Attention-deficit hyperactivity disorder, unspecified type: Secondary | ICD-10-CM | POA: Diagnosis not present

## 2020-04-09 DIAGNOSIS — F17210 Nicotine dependence, cigarettes, uncomplicated: Secondary | ICD-10-CM | POA: Diagnosis not present

## 2020-04-09 DIAGNOSIS — Z558 Other problems related to education and literacy: Secondary | ICD-10-CM | POA: Diagnosis not present

## 2020-04-09 NOTE — Progress Notes (Signed)
Virtual Visit via Video Note  I connected with Amanda Fowler on 04/09/20 at  9:00 AM EDT by a video enabled telemedicine application and verified that I am speaking with the correct person using two identifiers.   I discussed the limitations of evaluation and management by telemedicine and the availability of in person appointments. The patient expressed understanding and agreed to proceed.   I discussed the assessment and treatment plan with the patient. The patient was provided an opportunity to ask questions and all were answered. The patient agreed with the plan and demonstrated an understanding of the instructions.   The patient was advised to call back or seek an in-person evaluation if the symptoms worsen or if the condition fails to improve as anticipated.  I provided 15 minutes of non-face-to-face time during this encounter.   Oneta Rack, NP   [facility] MD/PA/NP OP Progress Note  04/09/2020 10:15 AM Amanda Fowler  MRN:  [id number]  Evaluation:  Amanda Fowler was seen and evaluated via WebEx.  She is denying suicidal or homicidal ideations.  Denies auditory or visual hallucinations.  Rating her depression 4 out of 10 with 10 being the worst.  Reports learning new coping skills such as distraction techniques and reframing.  States she has been working on her self-care which has improved her mood. Reported she is still considering her options with restarting school.  Reported trazodone does help with her sleep.  She denies nightmares or night sweats with medication.  Reports a good appetite.  States she is resting well throughout the night.  Support, encouragement and reassurance was provided.  Visit Diagnosis: No diagnosis found.  Past Psychiatric History:   Past Medical History:  Past Medical History:  Diagnosis Date  . ADHD   . Anxiety   . Depression   . Heart murmur   . Migraines    with aura    Past Surgical History:  Procedure Laterality Date  . BIOPSY  THYROID     benign    Family Psychiatric History:   Family History:  Family History  Problem Relation Age of Onset  . Thyroid disease Mother        hypo  . Anxiety disorder Mother   . Depression Mother   . ADD / ADHD Mother   . Aneurysm Maternal Aunt   . Aneurysm Maternal Grandmother   . Anxiety disorder Father   . Depression Father   . OCD Father     Social History:  Social History   Socioeconomic History  . Marital status: Single    Spouse name: Not on file  . Number of children: Not on file  . Years of education: Not on file  . Highest education level: Not on file  Occupational History  . Not on file  Tobacco Use  . Smoking status: Current Some Day Smoker    Types: E-cigarettes  . Smokeless tobacco: Never Used  Substance and Sexual Activity  . Alcohol use: Yes    Alcohol/week: 1.0 standard drinks    Types: 1 Standard drinks or equivalent per week  . Drug use: Yes    Types: Marijuana  . Sexual activity: Yes    Partners: Male    Birth control/protection: I.U.D.    Comment: Mirena 01/2018  Other Topics Concern  . Not on file  Social History Narrative   Pt is a Dietitian with 4 roommates. Pt has a history of depression and stopped her Effexor abruptly 4-5 days ago for no reason knowing  the negative side effects of this action. Pt identifies her stressor as school related.   Social Determinants of Health   Financial Resource Strain:   . Difficulty of Paying Living Expenses:   Food Insecurity:   . Worried About Charity fundraiser in the Last Year:   . Arboriculturist in the Last Year:   Transportation Needs:   . Film/video editor (Medical):   Marland Kitchen Lack of Transportation (Non-Medical):   Physical Activity:   . Days of Exercise per Week:   . Minutes of Exercise per Session:   Stress:   . Feeling of Stress :   Social Connections:   . Frequency of Communication with Friends and Family:   . Frequency of Social Gatherings with Friends and Family:   .  Attends Religious Services:   . Active Member of Clubs or Organizations:   . Attends Archivist Meetings:   Marland Kitchen Marital Status:     Allergies:  Allergies  Allergen Reactions  . Amoxicillin Hives    Has patient had a PCN reaction causing immediate rash, facial/tongue/throat swelling, SOB or lightheadedness with hypotension: Yes Has patient had a PCN reaction causing severe rash involving mucus membranes or skin necrosis: Yes Has patient had a PCN reaction that required hospitalization: No Has patient had a PCN reaction occurring within the last 10 years: Unknown If all of the above answers are "NO", then may proceed with Cephalosporin use.     Metabolic Disorder Labs: Lab Results  Component Value Date   HGBA1C 5.2 03/12/2020   MPG 102.54 03/12/2020   No results found for: PROLACTIN Lab Results  Component Value Date   CHOL 281 (H) 03/12/2020   TRIG 147 03/12/2020   HDL 50 03/12/2020   CHOLHDL 5.6 03/12/2020   VLDL 29 03/12/2020   LDLCALC 202 (H) 03/12/2020   LDLCALC 122 (H) 08/09/2018   Lab Results  Component Value Date   TSH 1.391 03/12/2020   TSH 0.71 03/16/2016    Therapeutic Level Labs: No results found for: LITHIUM No results found for: VALPROATE No components found for:  CBMZ  Current Medications: Current Outpatient Medications  Medication Sig Dispense Refill  . amphetamine-dextroamphetamine (ADDERALL) 10 MG tablet Take 10 mg by mouth as needed.    . ARIPiprazole (ABILIFY) 2 MG tablet Take 1 tablet (2 mg total) by mouth daily. As an djunct to Effexor-XR: For depression 30 tablet 0  . levonorgestrel (MIRENA) 20 MCG/24HR IUD 1 each by Intrauterine route once.    . lisdexamfetamine (VYVANSE) 20 MG capsule Take 20 mg by mouth as needed.    . nicotine (NICODERM CQ - DOSED IN MG/24 HOURS) 14 mg/24hr patch Place 1 patch (14 mg total) onto the skin daily. (May buy from over the counter): For smoking cessation (Patient not taking: Reported on 03/24/2020) 28  patch 0  . propranolol (INDERAL) 10 MG tablet Take 1 tablet (10 mg total) by mouth 3 (three) times daily. For anxiety 45 tablet 1  . traZODone (DESYREL) 50 MG tablet Take 1 tablet (50 mg total) by mouth at bedtime as needed for sleep. (Patient not taking: Reported on 03/31/2020) 30 tablet 0  . venlafaxine XR (EFFEXOR-XR) 150 MG 24 hr capsule Take 1 capsule (150 mg total) by mouth daily with breakfast. 30 capsule 0   No current facility-administered medications for this visit.     Musculoskeletal:   Psychiatric Specialty Exam: Review of Systems  There were no vitals taken for this visit.There is  no height or weight on file to calculate BMI.  General Appearance: Casual  Eye Contact:  Good  Speech:  Clear and Coherent  Volume:  Normal  Mood:  Anxious and Depressed  Affect:  Appropriate  Thought Process:  Coherent  Orientation:  Full (Time, Place, and Person)  Thought Content: Logical   Suicidal Thoughts:  No  Homicidal Thoughts:  No  Memory:  Immediate;   Fair Remote;   Fair  Judgement:  Fair  Insight:  Fair  Psychomotor Activity:  Normal  Concentration:  Concentration: Fair  Recall:  [employer] of Knowledge: Fair  Language: Fair  Akathisia:  No  Handed:  Right  AIMS (if indicated):   Assets:  Communication Skills Desire for Improvement Resilience Social Support  ADL's:  Intact  Cognition: WNL  Sleep:  Fair   Screenings: AIMS     [facility] (Dis[facility]  AIMS Total Score  0    AUDIT     Admission (Discharged) [facility]  Alcohol Use Disorder Identification Test Final Score (AUDIT)  3    PHQ2-9     Counselor from [facility]  PHQ-2 Total Score  2  PHQ-9 Total Score  11       Assessment and Plan:  Continue partial hospitalization programming Treatment plan was  reviewed and agreed upon by NPT Amanda Fowler and patient Amanda Fowler need for continued group services   Oneta Rack, NP 04/09/2020, 10:15 AM

## 2020-04-09 NOTE — Progress Notes (Addendum)
GROUP NOTE -  Spiritual care group 11:00 - 12:00 ? Group met via web-ex due to COVID-19 precautions.  Group facilitated by Simone Curia, MDiv, BCC  ? ? Group focused on topic of strength. ?Group members reflected on what thoughts and feelings emerge when they hear this topic. Group engaged in reflective art activity around strength.?They then engaged in facilitated dialog around how strength is present in their lives. This dialog focused on representing what strength had been to them in their lives (images and patterns given) and what they saw as helpful in their life now (what they needed / wanted). ? ? Activity drew on narrative framework    Amanda Fowler was present throughout group.  Connected idea of strength with "growth"  Noted changes in her perception of strength since high school and found herself seeking more groundedness in her values and seeking kindness and compassion.  She also noted "not allowing my emotions to take over" and "pushing through procrastination" as ways she sees strength in her life.

## 2020-04-09 NOTE — Psych (Signed)
Virtual Visit via Video Note  I connected with Amanda Fowler on 03/24/20 at  9:00 AM EDT by a video enabled telemedicine application and verified that I am speaking with the correct person using two identifiers.   I discussed the limitations of evaluation and management by telemedicine and the availability of in person appointments. The patient expressed understanding and agreed to proceed.  I discussed the assessment and treatment plan with the patient. The patient was provided an opportunity to ask questions and all were answered. The patient agreed with the plan and demonstrated an understanding of the instructions.   The patient was advised to call back or seek an in-person evaluation if the symptoms worsen or if the condition fails to improve as anticipated.  Pt was provided 240 minutes of non-face-to-face time during this encounter.   [name], LCSW    [facility] PHP THERAPIST PROGRESS NOTE  Amanda Fowler [id number]  Session Time: 9:00 - 10:00  Participation Level: Active  Behavioral Response: CasualAlertDepressed  Type of Therapy: Group Therapy  Treatment Goals addressed: Coping  Interventions: CBT, DBT, Supportive and Reframing  Summary:  Clinician led check-in regarding current stressors and situation, and review of patient completed daily inventory. Clinician utilized active listening and empathetic response and validated patient emotions. Clinician facilitated processing group on pertinent issues.   Therapist Response:Amanda Fowler is a 24 y.o. female who presents with depression and anxiety symptoms. Patient arrived within time allowed and reports that she is feeling "distracted." Patient rates hermood at a 4on a scale of 1-10 with 10 being great. Pt reports she struggled with "obsessive" thoughts over the weekend. Pt states she hung out with friends on Friday and it went badly and she ruminated about the situation. Pt reports experiencing  self-harm thoughts and managing them by sleeping. Pt is able to process. Patient engaged in discussion.      Session Time: 10:00-11:00  Participation Level:Minimal  Behavioral Response:CasualAlertDepressed  Type of Therapy:Group Therapy  Treatment Goals addressed: Coping  Interventions:CBT, DBT, Solution Focused, Supportive and Reframing  Summary:Cln led discussion on shame and the way it affects [place]. Group members were given space to process their struggles with shame. Cln encouraged pt's to apply cognitive distortions and thought challenging to feelings of shame.   Therapist Response:  Pt minimally engaged and seemed distracted in session.      Session Time: 11:00- 12:00  Participation Level:Active  Behavioral Response:CasualAlertDepressed  Type of Therapy: Group Therapy, OT  Treatment Goals addressed: Coping  Interventions:Psychosocial skills training, Supportive  Summary:Occupational Therapy group  Therapist Response:Patient engaged in group. See OT note.       Session Time: 12:00 -1:00  Participation Level:Active  Behavioral Response:CasualAlertDepressed  Type of Therapy:Group therapy  Treatment Goals addressed: Coping  Interventions:CBT; Solution focused; Supportive; Reframing  Summary:12:00 - 12:50: Cln introduced topic of boundaries. Cln and group members discussed how they think of boundaries and how learning and applying healthy boundaries can be helpful in recovery. Cln read 12 examples of boundary issues and pt's discussed which examples they could apply to themselves and struggles they have with those concepts.  12:50 -1:00 Clinician led check-out. Clinician assessed for immediate needs, medication compliance and efficacy, and safety concerns  Therapist Response:12:00 - 12:50:Pt engaged in discussion. Pt identifies having poor boundaries and reports desire to strengthen the boundaries they  have.   12:50 - 1:00: At check-out, patientrates hermood at a 5on a scale of 1-10 with 10 being great.Pt states afternoon plans of doing her nails.  Patient demonstratessomeprogress as evidenced by managing self-harm thoughts effectively.Patient denies SI/HI/self-harm at the end of group.   Suicidal/Homicidal: Nowithout intent/plan  Plan: Pt will continue in PHP while working to decrease depression and anxiety symptoms, increase daily functioning, and increase ability to manage symptoms as they arise.   Diagnosis: MDD (major depressive disorder), recurrent severe, without psychosis (HCC) [F33.2]    1. MDD (major depressive disorder), recurrent severe, without psychosis (HCC)       [name], LCSW 04/09/2020

## 2020-04-10 ENCOUNTER — Other Ambulatory Visit (HOSPITAL_COMMUNITY): Payer: BC Managed Care – PPO | Admitting: Licensed Clinical Social Worker

## 2020-04-10 ENCOUNTER — Other Ambulatory Visit (HOSPITAL_COMMUNITY): Payer: BC Managed Care – PPO

## 2020-04-10 ENCOUNTER — Encounter (HOSPITAL_COMMUNITY): Payer: Self-pay

## 2020-04-10 ENCOUNTER — Other Ambulatory Visit: Payer: Self-pay

## 2020-04-10 DIAGNOSIS — Z658 Other specified problems related to psychosocial circumstances: Secondary | ICD-10-CM | POA: Diagnosis not present

## 2020-04-10 DIAGNOSIS — R45851 Suicidal ideations: Secondary | ICD-10-CM | POA: Diagnosis not present

## 2020-04-10 DIAGNOSIS — F339 Major depressive disorder, recurrent, unspecified: Secondary | ICD-10-CM | POA: Diagnosis not present

## 2020-04-10 DIAGNOSIS — Z793 Long term (current) use of hormonal contraceptives: Secondary | ICD-10-CM | POA: Diagnosis not present

## 2020-04-10 DIAGNOSIS — F419 Anxiety disorder, unspecified: Secondary | ICD-10-CM | POA: Diagnosis not present

## 2020-04-10 DIAGNOSIS — F320 Major depressive disorder, single episode, mild: Secondary | ICD-10-CM

## 2020-04-10 DIAGNOSIS — F909 Attention-deficit hyperactivity disorder, unspecified type: Secondary | ICD-10-CM | POA: Diagnosis not present

## 2020-04-10 DIAGNOSIS — F332 Major depressive disorder, recurrent severe without psychotic features: Secondary | ICD-10-CM

## 2020-04-10 DIAGNOSIS — Z79899 Other long term (current) drug therapy: Secondary | ICD-10-CM | POA: Diagnosis not present

## 2020-04-10 DIAGNOSIS — Z558 Other problems related to education and literacy: Secondary | ICD-10-CM | POA: Diagnosis not present

## 2020-04-10 DIAGNOSIS — F17210 Nicotine dependence, cigarettes, uncomplicated: Secondary | ICD-10-CM | POA: Diagnosis not present

## 2020-04-10 DIAGNOSIS — Z760 Encounter for issue of repeat prescription: Secondary | ICD-10-CM | POA: Diagnosis not present

## 2020-04-10 NOTE — BH Assessment (Signed)
Q-actual 2-wk PHQ 2-9   PHQ 2 is 1  PHQ 9 is 3  Participant reports that she has not been completing the wellness checks but her Team Members have been added.  Participant reports that she will change her reminder alarm to 9am in the morning instead of the afternoon.

## 2020-04-11 ENCOUNTER — Other Ambulatory Visit (HOSPITAL_COMMUNITY): Payer: BC Managed Care – PPO | Admitting: Licensed Clinical Social Worker

## 2020-04-11 ENCOUNTER — Encounter (HOSPITAL_COMMUNITY): Payer: Self-pay

## 2020-04-11 ENCOUNTER — Other Ambulatory Visit: Payer: Self-pay

## 2020-04-11 ENCOUNTER — Other Ambulatory Visit (HOSPITAL_COMMUNITY): Payer: BC Managed Care – PPO | Admitting: Specialist

## 2020-04-11 DIAGNOSIS — F909 Attention-deficit hyperactivity disorder, unspecified type: Secondary | ICD-10-CM | POA: Diagnosis not present

## 2020-04-11 DIAGNOSIS — F332 Major depressive disorder, recurrent severe without psychotic features: Secondary | ICD-10-CM

## 2020-04-11 DIAGNOSIS — F17210 Nicotine dependence, cigarettes, uncomplicated: Secondary | ICD-10-CM | POA: Diagnosis not present

## 2020-04-11 DIAGNOSIS — F339 Major depressive disorder, recurrent, unspecified: Secondary | ICD-10-CM | POA: Diagnosis not present

## 2020-04-11 DIAGNOSIS — Z760 Encounter for issue of repeat prescription: Secondary | ICD-10-CM | POA: Diagnosis not present

## 2020-04-11 DIAGNOSIS — F320 Major depressive disorder, single episode, mild: Secondary | ICD-10-CM

## 2020-04-11 DIAGNOSIS — Z79899 Other long term (current) drug therapy: Secondary | ICD-10-CM | POA: Diagnosis not present

## 2020-04-11 DIAGNOSIS — Z558 Other problems related to education and literacy: Secondary | ICD-10-CM | POA: Diagnosis not present

## 2020-04-11 DIAGNOSIS — Z658 Other specified problems related to psychosocial circumstances: Secondary | ICD-10-CM | POA: Diagnosis not present

## 2020-04-11 DIAGNOSIS — Z793 Long term (current) use of hormonal contraceptives: Secondary | ICD-10-CM | POA: Diagnosis not present

## 2020-04-11 DIAGNOSIS — F419 Anxiety disorder, unspecified: Secondary | ICD-10-CM | POA: Diagnosis not present

## 2020-04-11 DIAGNOSIS — R45851 Suicidal ideations: Secondary | ICD-10-CM | POA: Diagnosis not present

## 2020-04-11 NOTE — Therapy (Signed)
Virtual Visit via Video Note  I connected with Amanda Fowler on 04/11/20 at  1100 AM EDT by a video enabled telemedicine application and verified that I am speaking with the correct person using two identifiers.   I discussed the limitations of evaluation and management by telemedicine and the availability of in person appointments. The patient expressed understanding and agreed to proceed.  I discussed the assessment and treatment plan with the patient. The patient was provided an opportunity to ask questions and all were answered. The patient agreed with the plan and demonstrated an understanding of the instructions.   The patient was advised to call back or seek an in-person evaluation if the symptoms worsen or if the condition fails to improve as anticipated.  I provided 60 minutes of non-face-to-face time during this encounter.     Amanda Fowler, [place], [address] Phone: [phone]   Fax:  [phone]  Occupational Therapy Treatment  Patient Details  Name: Amanda Fowler MRN: [id number] Date of Birth: 06/04/1996 Referring Provider (OT): Oren Section   Encounter Date: 04/11/2020  OT End of Session - 04/11/20 1316    Visit Number  5    Number of Visits  8    Date for OT Re-Evaluation  04/21/20    Authorization Type  This pt's benefits are as follows: [employer]BS EFF 12/27/2017 $40.00 CO-PAY NO DED NO CO-INS OOP MAX $3,250.00/ $930.81 MET 60 COMB VISIT LIMIT PT/OT/ST/ 0 USED NO AUTH REQ S/W MARRISE [id number]    Authorization - Visit Number  5    Authorization - Number of Visits  60    Progress Note Due on Visit  10    OT Start Time  1100    OT Stop Time  1200    OT Time Calculation (min)  60 min    Activity Tolerance  Patient tolerated treatment well    Behavior During Therapy  WFL for tasks assessed/performed       Past Medical History:  Diagnosis Date  . ADHD   . Anxiety    . Depression   . Heart murmur   . Migraines    with aura    Past Surgical History:  Procedure Laterality Date  . BIOPSY THYROID     benign    There were no vitals filed for this visit.  Subjective Assessment - 04/11/20 1316    Currently in Pain?  No/denies      S: I have stopped vaping in bed.    O: patient participated in skilled OT group focusing on the relationship between an individual's mental and physical health.  Patient was educated and discussed the definitions of health, well-being, mental health, depression.  Patient was educated and discussed the importance of emotional wellbeing.  Correlation between physical and mental health was discussed.  Patient educated on strategies to implement to improve both physical and mental health:  healthy diet, sufficient sleep, exercise, setting realistic goals, having support system.  Group was educated on resiliency and its importance for maintaining positive mental and physical health.  Group discussed definition of resilience, benefits of improved resilience and 10 ways to develop resilience.  Education around the "[employer]" acronym for the foundations of resilience were discussed.  Group concluded with activity in which group listed thoughts they had experienced throughout the day, and worked on changing negative thoughts to positive thoughts.   A:  Patient was engaged throughout session.  Patient reflected that she  often does not follow through on MD appts due to rather be sleeping.  OT recommended that she plan appts for her health during her most alert times of day to improve compliance with attendance.  AK able to recognize current resiliency building strategies and identified that she wants to focus on keeping things in perspective.  P:  DC from skilled OT intervention this date.  Vangie Bicker, MHA, OTR/L [phone] OCCUPATIONAL THERAPY DISCHARGE SUMMARY  Visits from Start of Care: 5 Current functional level related to  goals / functional outcomes:see above   Remaining deficits: n/a   Education / Equipment: See above  Plan: Patient agrees to discharge.  Patient goals were met. Patient is being discharged due to meeting the stated rehab goals.  ?????                        OT Education - 04/11/20 1316    Education Details  educated on tips for improved mental and physical health as well as imroving resiliency    Person(s) Educated  Patient    Methods  Explanation    Comprehension  Verbalized understanding       OT Short Term Goals - 04/11/20 1318      OT SHORT TERM GOAL #1   Title  Pt will apply psychosocial skills and coping mechanisms to daily activities in order to function independently and reintegrate into community    Time  4    Period  Weeks    Status  Achieved    Target Date  04/21/20      OT SHORT TERM GOAL #2   Title  Pt will create and implement functional BADL/IADL routine upon reintegrating into the community for increased engagement in all daily, work, and leisure activities    Time  4    Period  Weeks    Status  Achieved      OT SHORT TERM GOAL #3   Title  Pt will engage in goal setting to improve functional BADL/IADL routine upon reintegrating into community.    Time  4    Period  Weeks    Status  Achieved      OT SHORT TERM GOAL #4   Title  Pt will recall and/or apply 1-3 sleep hygiene strategies to improve function in BADL routine upon reintegrating into community    Time  4    Period  Weeks    Status  Achieved               Plan - 04/11/20 1317    Body Structure / Function / Physical Skills  ADL    Cognitive Skills  Attention;Energy/Drive;Learn    Psychosocial Skills  Coping Strategies;Habits;Routines and Behaviors       Patient will benefit from skilled therapeutic intervention in order to improve the following deficits and impairments:   Body Structure / Function / Physical Skills: ADL Cognitive Skills: Attention, Energy/Drive,  Learn Psychosocial Skills: Coping Strategies, Habits, Routines and Behaviors   Visit Diagnosis: Current mild episode of major depressive disorder, unspecified whether recurrent (HCC)  MDD (major depressive disorder), recurrent severe, without psychosis ([place])    Problem List Patient Active Problem List   Diagnosis Date Noted  . MDD (major depressive disorder), recurrent severe, without psychosis ([place]) 03/11/2020  . Menstrual migraine 08/31/2013  . Menorrhagia 08/31/2013    Vangie Bicker, [place], OTR/L [phone]  04/11/2020, 1:18 PM  [place] [facility] [address]  [place], [place], [address] Phone: [phone]   Fax:  [phone]  Name: Maebell Lyvers MRN: [id number] Date of Birth: Jul 02, 1996

## 2020-04-14 ENCOUNTER — Other Ambulatory Visit: Payer: Self-pay

## 2020-04-14 ENCOUNTER — Encounter (HOSPITAL_COMMUNITY): Payer: Self-pay | Admitting: Family

## 2020-04-14 ENCOUNTER — Other Ambulatory Visit (HOSPITAL_COMMUNITY): Payer: BC Managed Care – PPO | Admitting: Licensed Clinical Social Worker

## 2020-04-14 DIAGNOSIS — Z79899 Other long term (current) drug therapy: Secondary | ICD-10-CM | POA: Diagnosis not present

## 2020-04-14 DIAGNOSIS — R45851 Suicidal ideations: Secondary | ICD-10-CM | POA: Diagnosis not present

## 2020-04-14 DIAGNOSIS — F419 Anxiety disorder, unspecified: Secondary | ICD-10-CM | POA: Diagnosis not present

## 2020-04-14 DIAGNOSIS — F339 Major depressive disorder, recurrent, unspecified: Secondary | ICD-10-CM | POA: Diagnosis not present

## 2020-04-14 DIAGNOSIS — Z793 Long term (current) use of hormonal contraceptives: Secondary | ICD-10-CM | POA: Diagnosis not present

## 2020-04-14 DIAGNOSIS — Z658 Other specified problems related to psychosocial circumstances: Secondary | ICD-10-CM | POA: Diagnosis not present

## 2020-04-14 DIAGNOSIS — R4589 Other symptoms and signs involving emotional state: Secondary | ICD-10-CM

## 2020-04-14 DIAGNOSIS — F332 Major depressive disorder, recurrent severe without psychotic features: Secondary | ICD-10-CM

## 2020-04-14 DIAGNOSIS — Z760 Encounter for issue of repeat prescription: Secondary | ICD-10-CM | POA: Diagnosis not present

## 2020-04-14 DIAGNOSIS — F17210 Nicotine dependence, cigarettes, uncomplicated: Secondary | ICD-10-CM | POA: Diagnosis not present

## 2020-04-14 DIAGNOSIS — Z558 Other problems related to education and literacy: Secondary | ICD-10-CM | POA: Diagnosis not present

## 2020-04-14 DIAGNOSIS — F909 Attention-deficit hyperactivity disorder, unspecified type: Secondary | ICD-10-CM | POA: Diagnosis not present

## 2020-04-14 MED ORDER — ARIPIPRAZOLE 2 MG PO TABS: 2.0000 mg | ORAL_TABLET | Freq: Every day | ORAL | 1 refills | Status: DC

## 2020-04-14 MED ORDER — ARIPIPRAZOLE 2 MG PO TABS: 2.0000 mg | ORAL_TABLET | Freq: Every day | ORAL | 0 refills | Status: DC

## 2020-04-14 NOTE — Progress Notes (Signed)
Spoke with patient via Webex video call, used 2 identifiers to correctly identify patient. She states she had a sad lonely weekend with some passive SI. Had no plan but was able to journal and watch fun youtube videos to clear her mind. Group skills are helpful that she has learned to apply. She would like to do IOP after discharge today from [facility]. She also needs refills of medication (Abilify) before discharge. Message will be sent to NP. On scale 1-10 as 10 being worst she rates depression at 6 and anxiety at 4. Denies SI/HI today and no A/V hallucinations. PHQ9=8. No issues or complaints.

## 2020-04-14 NOTE — Psych (Signed)
Virtual Visit via Video Note  I connected with Amanda Fowler on 03/28/20 at  9:00 AM EDT by a video enabled telemedicine application and verified that I am speaking with the correct person using two identifiers.   I discussed the limitations of evaluation and management by telemedicine and the availability of in person appointments. The patient expressed understanding and agreed to proceed.  I discussed the assessment and treatment plan with the patient. The patient was provided an opportunity to ask questions and all were answered. The patient agreed with the plan and demonstrated an understanding of the instructions.   The patient was advised to call back or seek an in-person evaluation if the symptoms worsen or if the condition fails to improve as anticipated.  Pt was provided 240 minutes of non-face-to-face time during this encounter.   Amanda Guiles, LCSW    [facility] PHP THERAPIST PROGRESS NOTE  Amanda Fowler [id number]  Session Time: 9:00 - 10:00  Participation Level: Active  Behavioral Response: CasualAlertDepressed  Type of Therapy: Group Therapy  Treatment Goals addressed: Coping  Interventions: CBT, DBT, Supportive and Reframing  Summary:  Clinician led check-in regarding current stressors and situation, and review of patient completed daily inventory. Clinician utilized active listening and empathetic response and validated patient emotions. Clinician facilitated processing group on pertinent issues.   Therapist Response:Amanda Fowler is a 24 y.o. female who presents with depression and anxiety symptoms. Patient arrived within time allowed and reports that she is feeling "exhausted." Patient rates hermood at a 5on a scale of 1-10 with 10 being great. Pt reports her afternoon went well overall. Pt reports her professors are working with her to complete assignments however she remains stressed. Pt reports struggling with bad dreams. Pt is able to  process. Patient engaged in discussion.      Session Time: 10:00-11:00  Participation Level:Active  Behavioral Response:CasualAlertDepressed  Type of Therapy: Group Therapy  Treatment Goals addressed: Coping  Interventions:CBT, DBT, Solution Focused, Supportive and Reframing  Summary:Cln led discussion on procrastination. Cln discussed two of the possible reasons for procrastination including low motivation/fatigue secondary to depression and anxiety symptoms and anxiety/worry about dealing with an aspect of the task you are procrastinating. Cln encouraged group members to consider the underlying issue and addressing them if applicable to an instance of procrastination. Group members shared ways they connect with these two reasons as well as tasks they are procrastinating due to one of these reasons. Group members provided support and processed issues brought up.    Therapist Response: Pt engaged in discussion and reports procrastination is an issue for them. Pt brainstormed with group about ways to handle their behaviors.      Session Time: 11:00- 12:00  Participation Level:Active  Behavioral Response:CasualAlertDepressed  Type of Therapy: Group Therapy  Treatment Goals addressed: Coping  Interventions:CBT, DBT, Solution Focused, Supportive and Reframing  Summary:Cln introduced topic of fear and how it motivated and affects [place]. Group viewed TED talk "why to define your fears instead of goals." Cln discussed how to apply decision making model presented in talk. Group utilized example to walk through the model together.  Therapist Response: Pt engaged in discussion and activity. Pt reports willingness to utilize decision making model.       Session Time: 12:00 -1:00  Participation Level:Active  Behavioral Response:CasualAlertDepressed  Type of Therapy: Group therapy  Treatment Goals addressed: Coping  Interventions:CBT;  Solution focused; Supportive; Reframing  Summary:12:00 - 12:50: Cln introduced concept of stoicism: defining what you can and cannot  control and focusing on what you can control. Group discussed frustrations and barriers with facing things they cannot control and how practicing stoicism can help. 12:50 -1:00 Clinician led check-out. Clinician assessed for immediate needs, medication compliance and efficacy, and safety concerns  Therapist Response:12:00 - 12:50: Pt engaged in discussion and reports frequent struggles with things she cannot control. 12:50 - 1:00: At check-out, patientrates hermood at a 7on a scale of 1-10 with 10 being great.Pt states afternoon plans of painting and doing school work. Patient demonstrates someprogress as evidenced by increased engagement.Patient denies SI/HI/self-harm at the end of group.    Suicidal/Homicidal: Nowithout intent/plan  Plan: Pt will continue in PHP while working to decrease depression and anxiety symptoms, increase daily functioning, and increase ability to manage symptoms as they arise.   Diagnosis: Severe episode of recurrent major depressive disorder, without psychotic features ([place]) [F33.2]    1. Severe episode of recurrent major depressive disorder, without psychotic features ([place])       [name], LCSW 04/14/2020

## 2020-04-14 NOTE — Progress Notes (Signed)
Late entry: Unable to chart on 04/07/20 due to patient not being on schedule when she was seen.  Spoke with patient via Webex video call, used 2 identifiers to correctly identify patient. She states that groups are going OK, like the routine of being on a schedule. She has been eating more and has had no nausea when taking Adderall. On scale 1-10 as 10 being worst she rates depression at 2 and anxiety at 5. Denies SI/HI or AV hallucinations. No side effects from medication. No issue or complaints.

## 2020-04-14 NOTE — Psych (Signed)
Virtual Visit via Video Note  I connected with Amanda Fowler on 03/27/20 at  9:00 AM EDT by a video enabled telemedicine application and verified that I am speaking with the correct person using two identifiers.   I discussed the limitations of evaluation and management by telemedicine and the availability of in person appointments. The patient expressed understanding and agreed to proceed.  I discussed the assessment and treatment plan with the patient. The patient was provided an opportunity to ask questions and all were answered. The patient agreed with the plan and demonstrated an understanding of the instructions.   The patient was advised to call back or seek an in-person evaluation if the symptoms worsen or if the condition fails to improve as anticipated.  Pt was provided 240 minutes of non-face-to-face time during this encounter.   [name], LCSW    [facility] PHP THERAPIST PROGRESS NOTE  Amanda Fowler [id number]  Session Time: 9:00 - 10:00  Participation Level: Active  Behavioral Response: CasualAlertDepressed  Type of Therapy: Group Therapy  Treatment Goals addressed: Coping  Interventions: CBT, DBT, Supportive and Reframing  Summary:  Clinician led check-in regarding current stressors and situation, and review of patient completed daily inventory. Clinician utilized active listening and empathetic response and validated patient emotions. Clinician facilitated processing group on pertinent issues.   Therapist Response:Amanda Wester is a 24 y.o. female who presents with depression and anxiety symptoms. Patient arrived within time allowed and reports that she is feeling "low energy." Patient rates hermood at a 3.5on a scale of 1-10 with 10 being great. Pt reports she moved back to her parents house yesterday because she felt herself "tanking" at her apartment alone. Pt states she experienced negative thinking and self-harm thoughts and  managing them by reaching out to her boyfriend. Pt states getting new painting supplies to help her cope. Pt is able to process. Patient engaged in discussion.      Session Time: 10:00-11:00  Participation Level:Active  Behavioral Response:CasualAlertDepressed  Type of Therapy: Group Therapy  Treatment Goals addressed: Coping  Interventions:CBT, DBT, Solution Focused, Supportive and Reframing  Summary:Cln led discussion on mindfulness. Cln introduced the "what" and "how" skills of mindfulness and how mindfulness can aid our mental health. Cln connected mindfulness to increased ability to utilize distress tolerance skills and cognitive distortions. Group problem-solved struggles with the concept of mindfulness.   Therapist Response: Pt engaged in discussion and reports understanding of mindfulness. Pt states mindfulness could be helpful to prevent thought spiraling.      Session Time: 11:00- 12:00  Participation Level:Active  Behavioral Response:CasualAlertDepressed  Type of Therapy: Group Therapy  Treatment Goals addressed: Coping  Interventions:CBT, DBT, Solution Focused, Supportive and Reframing  Summary:Cln continued topic of boundaries. Group utilized hand-out "How to set a boundary" to discuss process of setting healthy boundaries. Group discussed how to problem solve common boundary issues.  Therapist Response: Pt engaged in discussion and reports understanding of how to set a boundary. Pt reports struggling with feeling justified in setting boundaries.        Session Time: 12:00 -1:00  Participation Level:Active  Behavioral Response:CasualAlertDepressed  Type of Therapy: Group therapy  Treatment Goals addressed: Coping  Interventions:CBT; Solution focused; Supportive; Reframing  Summary:12:00 - 12:50:Cln continued topic of boundaries. Cln led group in a boundary workshop in which group members shared current  boundary issues and group worked together to address issues utilizing boundary tenets discussed previously.  12:50 -1:00 Clinician led check-out. Clinician assessed for immediate needs, medication compliance  and efficacy, and safety concerns  Therapist Response:12:00 - 12:50: Pt engaged in activity and discussion. Pt demonstrates understanding of how to set and enforce boundaries throughout activity.   12:50 - 1:00: At check-out, patientrates hermood at a 7on a scale of 1-10 with 10 being great.Pt states afternoon plans of seeing her grandparents and having a professor meeting. Patient demonstratessomeprogress as evidenced by taking steps to keep herself healthy.Patient denies SI/HI/self-harm at the end of group.    Suicidal/Homicidal: Nowithout intent/plan  Plan: Pt will continue in PHP while working to decrease depression and anxiety symptoms, increase daily functioning, and increase ability to manage symptoms as they arise.   Diagnosis: MDD (major depressive disorder), recurrent severe, without psychosis ([place]) [F33.2]    1. MDD (major depressive disorder), recurrent severe, without psychosis ([place])       [name], LCSW 04/14/2020

## 2020-04-14 NOTE — Progress Notes (Signed)
  [facility] Intensive Outpatient Program Discharge Summary  Amanda Fowler [id number]  Admission date: 03/28/2020 Discharge date: 04/14/2020  Reason for admission: Per admission assessment note: na- Amanda Fowler is a 24 y.o. Caucasian female presents with depression and anxiety after a recent inpatient admission. Reported she was having suicidal ideations. As reported by inpatient admission note: "Amanda Fowler is a 24 y.o female who was admitted to the behavioral health hospital after voluntarily presenting for an assessment due to worsening depression and suicidal ideations with thoughts of overdosing or an intentional car accident. After evaluation by the psychiatric team, it was recommended by the psychiatrist that she resume Effexor ER at 150 mg for depression and anxiety. The psychiatrist also recommended augmenting with Abilify 2 mg daily for depression. The psychiatrist reviewed the risks/benefits and side effects of these medications. The patient was in agreement with this plan. Amanda has participated in the unit milieu and group therapy to learn coping skills to help manage her anxiety and depression. Amanda has consistently reported that she has no suicidal intent. "  Patient was enrolled in partial psychiatric program on 03/28/20   Progress in Program Toward Treatment Goals: Ongoing, patient attended and participated with daily group sessions with active and engaged participation.  She reports working on his coping skills by setting boundaries with peers and family.  Amanda Fowler is denying suicidal or homicidal ideations during this assessment.  Patient is requesting medication refill with Abilify.  Denied any medication side effects.  Patient to attend intensive outpatient programming on 04/16/2020.  Staff to continue to monitor for safety.  Support encouragement reassurance was provided.  Progress (rationale):  Amanda Fowler is stepping down to  Intensive Outpatient Programing  IOP   Take all medications as prescribed. Keep all follow-up appointments as scheduled.  Do not consume alcohol or use illegal drugs while on prescription medications. Report any adverse effects from your medications to your primary care provider promptly.  In the event of recurrent symptoms or worsening symptoms, call 911, a crisis hotline, or go to the nearest emergency department for evaluation.   Amanda Rack, NP 04/14/2020

## 2020-04-15 ENCOUNTER — Ambulatory Visit (HOSPITAL_COMMUNITY): Payer: BC Managed Care – PPO | Admitting: Psychiatry

## 2020-04-15 ENCOUNTER — Other Ambulatory Visit: Payer: Self-pay

## 2020-04-15 ENCOUNTER — Ambulatory Visit (HOSPITAL_COMMUNITY): Payer: BC Managed Care – PPO

## 2020-04-15 ENCOUNTER — Other Ambulatory Visit (HOSPITAL_COMMUNITY): Payer: BC Managed Care – PPO

## 2020-04-16 ENCOUNTER — Other Ambulatory Visit (HOSPITAL_COMMUNITY): Payer: BC Managed Care – PPO | Admitting: Licensed Clinical Social Worker

## 2020-04-16 ENCOUNTER — Other Ambulatory Visit (HOSPITAL_COMMUNITY): Payer: BC Managed Care – PPO

## 2020-04-16 ENCOUNTER — Other Ambulatory Visit: Payer: Self-pay

## 2020-04-16 ENCOUNTER — Encounter (HOSPITAL_COMMUNITY): Payer: Self-pay

## 2020-04-16 DIAGNOSIS — Z793 Long term (current) use of hormonal contraceptives: Secondary | ICD-10-CM | POA: Diagnosis not present

## 2020-04-16 DIAGNOSIS — F17210 Nicotine dependence, cigarettes, uncomplicated: Secondary | ICD-10-CM | POA: Diagnosis not present

## 2020-04-16 DIAGNOSIS — F332 Major depressive disorder, recurrent severe without psychotic features: Secondary | ICD-10-CM

## 2020-04-16 DIAGNOSIS — R45851 Suicidal ideations: Secondary | ICD-10-CM | POA: Diagnosis not present

## 2020-04-16 DIAGNOSIS — F909 Attention-deficit hyperactivity disorder, unspecified type: Secondary | ICD-10-CM | POA: Diagnosis not present

## 2020-04-16 DIAGNOSIS — Z658 Other specified problems related to psychosocial circumstances: Secondary | ICD-10-CM | POA: Diagnosis not present

## 2020-04-16 DIAGNOSIS — F339 Major depressive disorder, recurrent, unspecified: Secondary | ICD-10-CM | POA: Diagnosis not present

## 2020-04-16 DIAGNOSIS — Z760 Encounter for issue of repeat prescription: Secondary | ICD-10-CM | POA: Diagnosis not present

## 2020-04-16 DIAGNOSIS — F419 Anxiety disorder, unspecified: Secondary | ICD-10-CM | POA: Diagnosis not present

## 2020-04-16 DIAGNOSIS — Z558 Other problems related to education and literacy: Secondary | ICD-10-CM | POA: Diagnosis not present

## 2020-04-16 DIAGNOSIS — R4589 Other symptoms and signs involving emotional state: Secondary | ICD-10-CM

## 2020-04-16 DIAGNOSIS — Z79899 Other long term (current) drug therapy: Secondary | ICD-10-CM | POA: Diagnosis not present

## 2020-04-16 NOTE — Progress Notes (Signed)
Virtual Visit via Video Note   I connected with Amanda Fowler, who prefers to go by "A.K." on 04/16/20 at 9:00 AM EST by a video enabled telemedicine application and verified that I am speaking with the correct person using two identifiers.   Location: Patient: Patient Home Provider: [facility] OPT Office   Case Manager discussed the limitations of evaluation and management by telemedicine and the availability of in person appointments during orientation. The patient expressed understanding and agreed to proceed.   History of Present Illness: MDD   Observations/Objective: Case Manager checked in with all participants to review discharge dates, insurance authorizations, work-related documents and needs for the treatment team. Counselor facilitated a check-in with group members to gauge mood and current functioning as well as identify recent progress towards treatment goals.  A.K. presented to group session on time and was alert, oriented x5, with no evidence or self-report of current SI/HI or A/V H.  A.K. reported scores of 5/10 for depression and 7/10 for anxiety today.  A.K spent time introducing herself to group today, explaining how she has struggled with anxiety, depression, ADHD and other growing mental health concerns which led her to become more stressed at school, and eventually reach out for help.  A.K reported that she would like to learn more coping skills to help manage symptoms, avoid being as hard on herself, and "Just started to feel better".  She reported that her plan today is to return some packages, take a nap to recharge, and visit a property she is thinking about renting.    Psycho-educational portion of group was provided by pharmacist, Peggye Fothergill. Pharmacist provided psychoeducation on classes of medications such as antidepressants, antipsychotics, what symptoms they are intended to treat, and any side effects one might encounter while on prescription.  Time was allowed for  clients to ask any questions they might have of pharmacist.  A.K was observed actively listening to this speaker, but denied having any questions regarding medications at this time.    Counselor provided demonstration of relaxation technique known as mindful breathing to help members increase sense of calm, resiliency, and control.  Counselor guided members through process of getting comfortable, achieving a relaxed breathing rhythm, and focusing on this for several minutes, allowing troubling thoughts and feelings to come and go without rumination.  Counselor processed effectiveness of activity afterward in discussion with members, including how this impacted their mental state, whether it was difficult to stay focused, and if they plan to include it in self-care routine to improve day-to-day coping.  A.K reported that she did not participate in this exercise today due to her upraising with a mother that practiced yoga and mindfulness, disclosing that it can be triggering when people tell her to 'breathe' or 'relax'.  She reported that she finds it hard to focus during similar mindfulness activities and prefers "Physical relaxation approaches like playing video games where I can work towards a goal to distract myself".    Psycho-educational portion of group was co-facilitated by wellness director (David Stall, MS, MPH, [facility]) focused on self-care in daily life. Facilitator and group members discussed presented materials regarding importance of sleep, diet, and exercise. Group members discussed any changes they are willing to make to improve an area of self-care in their lives (physical, psychological, emotional, spiritual, relationship, professional) to improve overall mental health as they continue with treatment.  A.K participated in discussion, reporting that she is focused upon improving physical self-care during treatment.  She reported that she  used to be very active in high school, and was involved in  several sports, but upon entering college, her routine changed, and she gained weight, which has negatively impacted self-image and may contribute to depression. A.K reported that she has begun engaging in a 'pure barre' class to address this, which offers cardio exercise without stressing her joints.    Counselor ended session by acknowledging a graduating group member by prompting graduating member to reflect on progress made, takeaways from treatment and plan for stepping down. Counselor and group members shared observations of growth, encouragement and support as they transition out of the program. A.K reported that although she did not know the group very well yet, she still offered supportive words of encouragement and praise for graduating members today.     Assessment and Plan: Counselor recommends that patient remains in IOP treatment to better manage mental health symptoms and continue to address treatment plan goals. Counselor recommends adherence to crisis/safety plan, taking medications as prescribed and following up with medical professionals if any issues arise.    Follow Up Instructions: Counselor will send Webex link for next session.  The patient was advised to call back or seek an in-person evaluation if the symptoms worsen or if the condition fails to improve as anticipated.   I provided 180 minutes of non-face-to-face time during this encounter.     Shade Flood, LCSW, LCAS

## 2020-04-16 NOTE — Progress Notes (Signed)
Psychiatric Initial Adult Assessment   Patient Identification: Amanda Fowler MRN:  [id number] Date of Evaluation:  04/16/2020 Referral Source: PHP Chief Complaint:   Depression and Anxeity Visit Diagnosis: No diagnosis found.  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 She reports worsening depression over recent days and states that on day of admission, "I felt like I was in a real dark place". She attributes depression in part to academic stressors/load. Endorses neuro-vegetative symptoms- hypersomnia, erratic appetite, decreased energy level, anhedonia, decreased sense of concentration. She reports recent suicidal ideations over the day or two prior to her admission, which she describes as " thinking it would be better to die". She reports recent thoughts of overdosing. Denies psychotic symptoms.In addition to depression she also describes significant anxiety, which she describes mainly as worrying excessively/anxious ruminations. She does have occasional panic attacks. Home medications- Effexor XR and Propranolol, which she reports she was taking on a PRN basis for anxiety.   Evaluation: Patient was recently discharged from [facility], reported overall her mood has improved. Reported she is continuing to work skills and distraction techniques  that she has learned in partial.  Amanda Fowler is denying suicidal or homicidal ideations during this assessment. Patient to transition to Intensive outpatient programing on 04/16/2020  Associated Signs/Symptoms: Depression Symptoms:  depressed mood, difficulty concentrating, (Hypo) Manic Symptoms:  Distractibility, Irritable Mood, Anxiety Symptoms:  Excessive Worry, Psychotic Symptoms:  Hallucinations: None PTSD Symptoms: NA  Past Psychiatric History:   Previous Psychotropic  Medications: No   Substance Abuse History in the last 12 months:  No.  Consequences of Substance Abuse: NA  Past Medical History:  Past Medical History:  Diagnosis Date  . ADHD   . Anxiety   . Depression   . Heart murmur   . Migraines    with aura    Past Surgical History:  Procedure Laterality Date  . BIOPSY THYROID     benign    Family Psychiatric History:   Family History:  Family History  Problem Relation Age of Onset  . Thyroid disease Mother        hypo  . Anxiety disorder Mother   . Depression Mother   . ADD / ADHD Mother   . Aneurysm Maternal Aunt   . Aneurysm Maternal Grandmother   . Anxiety disorder Father   . Depression Father   . OCD Father     Social History:   Social History   Socioeconomic History  . Marital status: Single    Spouse name: Not on file  . Number of children: Not on file  . Years of education: Not on file  . Highest education level: Not on file  Occupational History  . Not on file  Tobacco Use  . Smoking status: Current Some Day Smoker    Types: E-cigarettes  . Smokeless tobacco: Never Used  Substance and Sexual Activity  . Alcohol use: Yes    Alcohol/week: 1.0 standard drinks    Types: 1 Standard drinks or equivalent per week  . Drug use: Yes    Types: Marijuana  . Sexual activity: Yes    Partners: Male    Birth control/protection: I.U.D.    Comment: Mirena 01/2018  Other Topics Concern  . Not on file  Social History Narrative   Pt is a Dietitian with 4 roommates. Pt has a history of  depression and stopped her Effexor abruptly 4-5 days ago for no reason knowing the negative side effects of this action. Pt identifies her stressor as school related.   Social Determinants of Health   Financial Resource Strain:   . Difficulty of Paying Living Expenses:   Food Insecurity:   . Worried About Programme researcher, broadcasting/film/video in the Last Year:   . Barista in the Last Year:   Transportation Needs:   . Automotive engineer (Medical):   Marland Kitchen Lack of Transportation (Non-Medical):   Physical Activity:   . Days of Exercise per Week:   . Minutes of Exercise per Session:   Stress:   . Feeling of Stress :   Social Connections:   . Frequency of Communication with Friends and Family:   . Frequency of Social Gatherings with Friends and Family:   . Attends Religious Services:   . Active Member of Clubs or Organizations:   . Attends Banker Meetings:   Marland Kitchen Marital Status:     Additional Social History:   Allergies:   Allergies  Allergen Reactions  . Amoxicillin Hives    Has patient had a PCN reaction causing immediate rash, facial/tongue/throat swelling, SOB or lightheadedness with hypotension: Yes Has patient had a PCN reaction causing severe rash involving mucus membranes or skin necrosis: Yes Has patient had a PCN reaction that required hospitalization: No Has patient had a PCN reaction occurring within the last 10 years: Unknown If all of the above answers are "NO", then may proceed with Cephalosporin use.     Metabolic Disorder Labs: Lab Results  Component Value Date   HGBA1C 5.2 03/12/2020   MPG 102.54 03/12/2020   No results found for: PROLACTIN Lab Results  Component Value Date   CHOL 281 (H) 03/12/2020   TRIG 147 03/12/2020   HDL 50 03/12/2020   CHOLHDL 5.6 03/12/2020   VLDL 29 03/12/2020   LDLCALC 202 (H) 03/12/2020   LDLCALC 122 (H) 08/09/2018   Lab Results  Component Value Date   TSH 1.391 03/12/2020    Therapeutic Level Labs: No results found for: LITHIUM No results found for: CBMZ No results found for: VALPROATE  Current Medications: Current Outpatient Medications  Medication Sig Dispense Refill  . amphetamine-dextroamphetamine (ADDERALL) 10 MG tablet Take 10 mg by mouth as needed.    . ARIPiprazole (ABILIFY) 2 MG tablet Take 1 tablet (2 mg total) by mouth daily. As an djunct to Effexor-XR: For depression 30 tablet 1  . levonorgestrel (MIRENA) 20  MCG/24HR IUD 1 each by Intrauterine route once.    . lisdexamfetamine (VYVANSE) 20 MG capsule Take 20 mg by mouth as needed.    . nicotine (NICODERM CQ - DOSED IN MG/24 HOURS) 14 mg/24hr patch Place 1 patch (14 mg total) onto the skin daily. (May buy from over the counter): For smoking cessation (Patient not taking: Reported on 03/24/2020) 28 patch 0  . propranolol (INDERAL) 10 MG tablet Take 1 tablet (10 mg total) by mouth 3 (three) times daily. For anxiety 45 tablet 1  . traZODone (DESYREL) 50 MG tablet Take 1 tablet (50 mg total) by mouth at bedtime as needed for sleep. 30 tablet 0  . venlafaxine XR (EFFEXOR-XR) 150 MG 24 hr capsule Take 1 capsule (150 mg total) by mouth daily with breakfast. 30 capsule 0   No current facility-administered medications for this visit.    Musculoskeletal:   Psychiatric Specialty Exam: Review of Systems  There were no  vitals taken for this visit.There is no height or weight on file to calculate BMI.  General Appearance: Casual  Eye Contact:  Fair  Speech:  Clear and Coherent  Volume:  Normal  Mood:  Anxious and Depressed  Affect:  Congruent  Thought Process:  Coherent  Orientation:  Full (Time, Place, and Person)  Thought Content:  Hallucinations: None  Suicidal Thoughts:  No  Homicidal Thoughts:  No  Memory:  Immediate;   Fair Recent;   Fair  Judgement:  Fair  Insight:  Fair  Psychomotor Activity:  Normal  Concentration:  Concentration: Fair  Recall:  [employer] of Knowledge:Fair  Language: Fair  Akathisia:  No  Handed:  Right  AIMS (if indicated):    Assets:  Communication Skills Social Support  ADL's:  Intact  Cognition: WNL  Sleep:  Fair   Screenings: AIMS     Admission (Discharged) from OP Visit from 03/11/2020 in [place] [facility]  AIMS Total Score  0    AUDIT     Admission (Discharged) from OP Visit from 03/11/2020 in [facility] [place] [facility]  Alcohol Use Disorder Identification  Test Final Score (AUDIT)  3    PHQ2-9     Counselor from 04/14/2020 in [place] Counselor from 03/19/2020 in [place]  PHQ-2 Total Score  2  2  PHQ-9 Total Score  8  11      Assessment and Plan:  Patient to be admitted to IOP Abilify was refilled at discharged for [facility]  Treatment plan was reviewed and agreed upon by NP T.Bobby Rumpf and patient Amanda Fowler need for group services.    Derrill Center, NP 4/21/20218:36 AM

## 2020-04-16 NOTE — Progress Notes (Signed)
Virtual Visit via Video Note  I connected with Amanda Fowler on @TODAY @ at  9:00 AM EDT by a video enabled telemedicine application and verified that I am speaking with the correct person using two identifiers. I discussed the limitations of evaluation and management by telemedicine and the availability of in person appointments. The patient expressed understanding and agreed to proceed. I discussed the assessment and treatment plan with the patient. The patient was provided an opportunity to ask questions and all were answered. The patient agreed with the plan and demonstrated an understanding of the instructions. The patient was advised to call back or seek an in-person evaluation if the symptoms worsen or if the condition fails to improve as anticipated.  I provided 20 minutes of non-face-to-face time during this encounter.   Patient ID: Amanda Fowler, female   DOB: [birth year], 24 y.o.   MRN: [id number] As previous [facility] note states: Pt reports to PHP per inpt referral. Pt was inpt due to SI with plan/intent to overdose or have a car accident. Pt denies other hospitalizations. Pt reports counseling on/off since age 36, no current counselor; pt sees 14 for psychiatry at [employer] [employer] since freshman year. 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 Pt reports no suicide attempts, denies current SI/HI/AVH, endorses passive SI thoughts at times. Pt reports school, living with 4 girls (2 are pt's best friends) and "daily taking care of myself is hard." Pt family hx: Dad: anxiety, depression, OCD; Mom: depression, anxiety, ADHD. Patients Currently Reported Symptoms/Problems: Increased depression and anxiety; decreased ADLs (showering, cooking, cleaning); decreased motivation; increased fatigue; decreased concentration; over sleeping (18-20 hours/day prior to inpt stay, abt 12 currently); racing thoughts; feelings of hopelessness/worthlessness/helplessness; passive SI; school problems; panic  attacks  Pt transitioned to MH-IOP from [facility] today.  Denies SI/HI or A/V hallucinations.  On a scale 1-10 (10 being worst), pt rated her anxiety and depression a 6.  Reports PHP was very helpful. A:  Oriented pt to MH-IOP.  Encouraged pt to contact her insurance co to verify benefits.  Pt gave verbal consent for treatment, to release chart information to referred providers and to complete any forms if needed.  Pt also gave consent for attending group virtually d/t COVID-19 social distancing restrictions.  Encouraged support groups.  F/U with Dr. [facility] and case manager will schedule pt with a therapist.  R:  Patient receptive.  Lolly Mustache, M.Ed,CNA

## 2020-04-17 ENCOUNTER — Other Ambulatory Visit: Payer: Self-pay

## 2020-04-17 ENCOUNTER — Telehealth (HOSPITAL_COMMUNITY): Payer: Self-pay | Admitting: Psychiatry

## 2020-04-17 ENCOUNTER — Ambulatory Visit (HOSPITAL_COMMUNITY): Payer: BC Managed Care – PPO

## 2020-04-17 ENCOUNTER — Other Ambulatory Visit (HOSPITAL_COMMUNITY): Payer: BC Managed Care – PPO

## 2020-04-18 ENCOUNTER — Other Ambulatory Visit (HOSPITAL_COMMUNITY): Payer: BC Managed Care – PPO

## 2020-04-18 ENCOUNTER — Other Ambulatory Visit (HOSPITAL_COMMUNITY): Payer: BC Managed Care – PPO | Admitting: Licensed Clinical Social Worker

## 2020-04-18 ENCOUNTER — Ambulatory Visit (HOSPITAL_COMMUNITY): Payer: BC Managed Care – PPO

## 2020-04-18 ENCOUNTER — Other Ambulatory Visit: Payer: Self-pay

## 2020-04-18 DIAGNOSIS — F909 Attention-deficit hyperactivity disorder, unspecified type: Secondary | ICD-10-CM | POA: Diagnosis not present

## 2020-04-18 DIAGNOSIS — Z793 Long term (current) use of hormonal contraceptives: Secondary | ICD-10-CM | POA: Diagnosis not present

## 2020-04-18 DIAGNOSIS — Z760 Encounter for issue of repeat prescription: Secondary | ICD-10-CM | POA: Diagnosis not present

## 2020-04-18 DIAGNOSIS — F419 Anxiety disorder, unspecified: Secondary | ICD-10-CM | POA: Diagnosis not present

## 2020-04-18 DIAGNOSIS — R45851 Suicidal ideations: Secondary | ICD-10-CM | POA: Diagnosis not present

## 2020-04-18 DIAGNOSIS — R4589 Other symptoms and signs involving emotional state: Secondary | ICD-10-CM

## 2020-04-18 DIAGNOSIS — F17210 Nicotine dependence, cigarettes, uncomplicated: Secondary | ICD-10-CM | POA: Diagnosis not present

## 2020-04-18 DIAGNOSIS — F339 Major depressive disorder, recurrent, unspecified: Secondary | ICD-10-CM | POA: Diagnosis not present

## 2020-04-18 DIAGNOSIS — Z558 Other problems related to education and literacy: Secondary | ICD-10-CM | POA: Diagnosis not present

## 2020-04-18 DIAGNOSIS — Z79899 Other long term (current) drug therapy: Secondary | ICD-10-CM | POA: Diagnosis not present

## 2020-04-18 DIAGNOSIS — F332 Major depressive disorder, recurrent severe without psychotic features: Secondary | ICD-10-CM

## 2020-04-18 DIAGNOSIS — Z658 Other specified problems related to psychosocial circumstances: Secondary | ICD-10-CM | POA: Diagnosis not present

## 2020-04-18 NOTE — Progress Notes (Addendum)
Virtual Visit via Video Note  I connected with Amanda Fowler on 04/18/20 at  9:00 AM EDT by a video enabled telemedicine application and verified that I am speaking with the correct person using two identifiers.   I discussed the limitations of evaluation and management by telemedicine and the availability of in person appointments. The patient expressed understanding and agreed to proceed.  I discussed the assessment and treatment plan with the patient. The patient was provided an opportunity to ask questions and all were answered. The patient agreed with the plan and demonstrated an understanding of the instructions.   The patient was advised to call back or seek an in-person evaluation if the symptoms worsen or if the condition fails to improve as anticipated.  I provided 180 minutes of non-face-to-face time during this encounter.   Harlon Ditty, LCSW     Daily Group Progress Note  Program: IOP  Group Time: 9am-10:30am  Participation Level: Active  Behavioral Response: Appropriate and Sharing  Type of Therapy:  Process Group  Topic: Clinician checked in with group members, assessing for SI/HI/psychosis and overall level of functioning. Clinician and group members processed what is going well in their life and what could be going better as well as any barriers to improvement. Clinician and group members processed different responses with utilizing I-statements in conversations. Clinician provided progressive muscle relaxation activity in session to practice mindfulness skill. Clinician utilized validation and summarizing statements praise client identification of specific thoughts and feelings about topics when prompted.     Group Time: 10:30am-12pm  Participation Level:  Active  Behavioral Response: Appropriate and Sharing  Type of Therapy: Psycho-education Group  Topic: Clinician presented the psycho-educational topic of neuroplasticity. Clinician provided  supportive video 'Rewiring the Anxious Mind' and discussed connection between thoughts, feelings, and behaviors. Clinician and group members practiced identifying specific thoughts, feelings, and body sensations at different intensities of emotions. Clinician and group members practiced creating alternative thoughts for more comfortable, manageable feelings. Clinician presented DBT skill 'Letting Go of Emotional Suffering: Mindfulness of Your Current Emotion.' Group practiced with clinician pretzel and butterfly tapping as skills to regulate emotional intensity. Clinician inquired about self care plan for the weekend. One group member graduated.   Summary of Progress: Client engaged in group discussion, sharing thoughts and feelings related to recent interactions with roommates. Client shows progress toward goals AEB ability to discuss uncomfortable feelings and plan alternative thoughts. Client needs progress in rumination on distorted thinking. Client notes being increasingly able to be safe and complete necessary tasks and appeared receptive to group feedback.  Harlon Ditty, LCSW

## 2020-04-19 NOTE — Psych (Signed)
Virtual Visit via Video Note  I connected with Mirranda Gilliam on 03/31/20 at  9:00 AM EDT by a video enabled telemedicine application and verified that I am speaking with the correct person using two identifiers.   I discussed the limitations of evaluation and management by telemedicine and the availability of in person appointments. The patient expressed understanding and agreed to proceed.  I discussed the assessment and treatment plan with the patient. The patient was provided an opportunity to ask questions and all were answered. The patient agreed with the plan and demonstrated an understanding of the instructions.   The patient was advised to call back or seek an in-person evaluation if the symptoms worsen or if the condition fails to improve as anticipated.  Pt was provided 240 minutes of non-face-to-face time during this encounter.   [name], LCSW    [facility] PHP THERAPIST PROGRESS NOTE  Janus Vlcek [id number]  Session Time: 9:00 - 10:00  Participation Level: Active  Behavioral Response: CasualAlertDepressed  Type of Therapy: Group Therapy  Treatment Goals addressed: Coping  Interventions: CBT, DBT, Supportive and Reframing  Summary:  Clinician led check-in regarding current stressors and situation, and review of patient completed daily inventory. Clinician utilized active listening and empathetic response and validated patient emotions. Clinician facilitated processing group on pertinent issues.   Therapist Response:Angi [place] is a 24 y.o. female who presents with depression and anxiety symptoms. Patient arrived within time allowed and reports that she is feeling "neutral." Patient rates hermood at a 5on a scale of 1-10 with 10 being great. Pt reports experiencing mood lability over the weekend with increased tearfulness. Pt states she had not been taking her mood stabilizer and restarted it after recognizing this increase. Pt states  she went to a family gathering and had to leave early due to being overwhelmed. Pt reports struggling with negative thinking. Pt is able to process. Patient engaged in discussion.      Session Time: 10:00-11:00  Participation Level:Active  Behavioral Response:CasualAlertDepressed  Type of Therapy: Group Therapy  Treatment Goals addressed: Coping  Interventions:CBT, DBT, Solution Focused, Supportive and Reframing  Summary:Cln led discussion on struggles with support system wanting to "fix" things. Group discussed frustrations and guilt surrounding these issues and feeling invalidated and unheard. Cln encouraged group members to ask for what they need from support persons and communicating when they are being unhelpful. Group brainstormed ways to address these concerns and overcome possible barriers.   Therapist Response:  Pt engaged in discussion and reports feeling that they can not share what's going on with some people due to feeling invalidated when they do.      Session Time: 11:00- 12:00  Participation Level:Active  Behavioral Response:CasualAlertDepressed  Type of Therapy: Group Therapy  Treatment Goals addressed: Coping  Interventions:CBT, DBT, Solution Focused, Supportive and Reframing  Summary:Cln introduced topic of the five love languages. Cln led discussion on how understanding the concept of love languages can aid in communication, reframing, and having your needs met. Group took a five love languages quiz to determine their love language and discussed the results and how they felt it presented in their lives. Group discussed ways to apply this knowledge in their current relationships.   Therapist Response: Pt engaged in discussion and activity. Pt able to identify ways in which understanding of their love language can improve communication and understanding in their relationships.        Session Time: 12:00  -1:00  Participation Level:Active  Behavioral Response:CasualAlertDepressed  Type  of Therapy: Group therapy  Treatment Goals addressed: Coping  Interventions:CBT; Solution focused; Supportive; Reframing  Summary:12:00 - 12:50: Cln led discussion on conflict in relationships. Cln utilized handout "Relationship Conflict Resolution" to aid discussion. Group members shared ways they handle issues in relationships and how they can incorporate healthy communication skills to address the issues.  12:50 -1:00 Clinician led check-out. Clinician assessed for immediate needs, medication compliance and efficacy, and safety concerns  Therapist Response:12:00 - 12:50: Pt engaged in discussion and reports issues with shutting down. Pt able to determine ways to address the issue.  12:50 - 1:00: At check-out, patientrates hermood at a 6on a scale of 1-10 with 10 being great.Pt states afternoon plans of playing video games and attending an advisor meeting. Patient demonstrates someprogress as evidenced by making efforts with her family.Patient denies SI/HI/self-harm at the end of group.    Suicidal/Homicidal: Nowithout intent/plan  Plan: Pt will continue in PHP while working to decrease depression and anxiety symptoms, increase daily functioning, and increase ability to manage symptoms as they arise.   Diagnosis: Severe episode of recurrent major depressive disorder, without psychotic features ([place]) [F33.2]    1. Severe episode of recurrent major depressive disorder, without psychotic features ([place])       [name], LCSW 04/19/2020

## 2020-04-19 NOTE — Psych (Signed)
Virtual Visit via Video Note  I connected with Amanda Fowler on 04/03/20 at  9:00 AM EDT by a video enabled telemedicine application and verified that I am speaking with the correct person using two identifiers.   I discussed the limitations of evaluation and management by telemedicine and the availability of in person appointments. The patient expressed understanding and agreed to proceed.  I discussed the assessment and treatment plan with the patient. The patient was provided an opportunity to ask questions and all were answered. The patient agreed with the plan and demonstrated an understanding of the instructions.   The patient was advised to call back or seek an in-person evaluation if the symptoms worsen or if the condition fails to improve as anticipated.  Pt was provided 240 minutes of non-face-to-face time during this encounter.   [name], LCSW    [facility] PHP THERAPIST PROGRESS NOTE  Amanda Fowler [id number]  Session Time: 9:00 - 10:00  Participation Level: Active  Behavioral Response: CasualAlertDepressed  Type of Therapy: Group Therapy  Treatment Goals addressed: Coping  Interventions: CBT, DBT, Supportive and Reframing  Summary:  Clinician led check-in regarding current stressors and situation, and review of patient completed daily inventory. Clinician utilized active listening and empathetic response and validated patient emotions. Clinician facilitated processing group on pertinent issues.   Therapist Response:Amanda Fowler is a 24 y.o. female who presents with depression and anxiety symptoms. Patient arrived within time allowed and reports that she is feeling "anxious." Patient rates hermood at a 4on a scale of 1-10 with 10 being great. Pt reports she has been struggling with "obsessive" thoughts about her support system. Pt states struggling with her roommates and ruminating into issues with other people as well. Pt states  experiencing self harm thoughts after the negative spiral and trying to be present to manage. Pt is able to process. Patient engaged in discussion.      Session Time: 10:00-11:00  Participation Level:Active  Behavioral Response:CasualAlertDepressed  Type of Therapy:Group Therapy  Treatment Goals addressed: Coping  Interventions:CBT, DBT, Solution Focused, Supportive and Reframing  Summary:Cln introduced topic of feelings. Cln separated feelings and our reactions to feelings and discussed the differences between the two. Group worked to decipher situations to determine whether feelings or reactions are at play and how knowing the difference can reframe our perceptions and motivations.    Therapist Response: Pt engaged in discussion. Pt reports struggling with separating feelings versus the reaction to the feeling due to the strength of the feelings.        Session Time: 11:00- 12:00  Participation Level:Active  Behavioral Response:CasualAlertDepressed  Type of Therapy:Group Therapy  Treatment Goals addressed: Coping  Interventions:CBT, DBT, Solution Focused, Supportive and Reframing  Summary:Cln continued topic of feelings and introduced concept of emotional reasoning. Cln discussed the need to determine whether a feeling is a founded or unfounded feeling. Group worked through examples to Financial risk analyst determining if a feeling is founded or unfounded and how to respond in either scenario.  Therapist Response:  Pt engaged in discussion and activity. Pt reports understanding of feelings not equaling fact and demonstrates this in practice.        Session Time: 12:00 -1:00  Participation Level:Active  Behavioral Response:CasualAlertDepressed  Type of Therapy:Group therapy  Treatment Goals addressed: Coping  Interventions:CBT; Solution focused; Supportive; Reframing  Summary:12:00 - 12:50: Cln continued topic of  feelings. Group viewed TED talk "the power of vulnerability" and discussed ways in which feelings typically viewed as "negative" such as guilt,  shame, and vulnerability can serve important purposes. Group discussed ways in which vulnerability is difficult for them.  12:50 -1:00 Clinician led check-out. Clinician assessed for immediate needs, medication compliance and efficacy, and safety concerns  Therapist Response:12:00 - 12:50: Pt engaged in discussion and reports vulnerability is difficult for her due to struggles with trust.  12:50 - 1:00: At check-out, patientrates hermood at a 7on a scale of 1-10 with 10 being great.Pt states afternoon plans of working on homework and going to a workout. Patient demonstrates someprogress as evidenced by moving to a more positive place through processing.Patient denies SI/HI/self-harm at the end of group.    Suicidal/Homicidal: Nowithout intent/plan  Plan: Pt will continue in PHP while working to decrease depression and anxiety symptoms, increase daily functioning, and increase ability to manage symptoms as they arise.   Diagnosis: Severe episode of recurrent major depressive disorder, without psychotic features ([place]) [F33.2]    1. Severe episode of recurrent major depressive disorder, without psychotic features ([place])       [name], LCSW 04/19/2020

## 2020-04-19 NOTE — Psych (Signed)
Virtual Visit via Video Note  I connected with Amanda Fowler on 04/09/20 at  9:00 AM EDT by a video enabled telemedicine application and verified that I am speaking with the correct person using two identifiers.   I discussed the limitations of evaluation and management by telemedicine and the availability of in person appointments. The patient expressed understanding and agreed to proceed.  I discussed the assessment and treatment plan with the patient. The patient was provided an opportunity to ask questions and all were answered. The patient agreed with the plan and demonstrated an understanding of the instructions.   The patient was advised to call back or seek an in-person evaluation if the symptoms worsen or if the condition fails to improve as anticipated.  Pt was provided 240 minutes of non-face-to-face time during this encounter.   [name], LCSW    [facility] PHP THERAPIST PROGRESS NOTE  Amanda Fowler [id number]  Session Time: 9:00 - 10:00  Participation Level: Active  Behavioral Response: CasualAlertDepressed  Type of Therapy: Group Therapy  Treatment Goals addressed: Coping  Interventions: CBT, DBT, Supportive and Reframing  Summary:  Clinician led check-in regarding current stressors and situation, and review of patient completed daily inventory. Clinician utilized active listening and empathetic response and validated patient emotions. Clinician facilitated processing group on pertinent issues.   Therapist Response:Amanda Fowler is a 24 y.o. female who presents with depression and anxiety symptoms. Patient arrived within time allowed and reports that she is feeling "kind of blah." Patient rates hermood at a 5on a scale of 1-10 with 10 being great. Pt reports struggling with rumination and negative self-talk yesterday. Pt states she attempted to focus on reframing and distractions however struggled and felt defeated. Pt is able to process.  Patient engaged in discussion.      Session Time: 10:00 -11:00  Participation Level:Active  Behavioral Response:CasualAlertDepressed  Type of Therapy: Group Therapy, psychoeducation, psychotherapy  Treatment Goals addressed: Coping  Interventions:CBT, DBT, Solution Focused, Supportive and Reframing  Summary:Cln led discussion on the importance of allowing ourselves to be works in progress. Cln brought reframing in to highlight how changing perspective can increase willingness to extend grace and kindness to ourselves. Group members processed struggles they have with offering kindness to themselves and offered each other support.   Therapist Response: Pt engaged in discussion and reports struggling with being kind to oneself amidst their recovery journey. Pt is able to process and offer and receive support.       Session Time: 11:00 -12:00  Participation Level:Active  Behavioral Response:CasualAlertDepressed  Type of Therapy: Group Therapy, psychotherapy  Treatment Goals addressed: Coping  Interventions:Strengths based, reframing, Supportive,   Summary:Spiritual Care group  Therapist Response: Patient engaged in group. See chaplain note.        Session Time: 12:00- 1:00  Participation Level:Active  Behavioral Response:CasualAlertDepressed  Type of Therapy: Group Therapy, Psychoeducation  Treatment Goals addressed: Coping  Interventions:relaxation training; Supportive; Reframing  Summary:12:00 - 12:50: Relaxation group: Cln led group focused on retraining the body's response to stress.  12:50 -1:00 Clinician led check-out. Clinician assessed for immediate needs, medication compliance and efficacy, and safety concerns  Therapist Response:Pt engaged in activity and discussion.  12:50 - 1:00: At check-out, patientrates hermood at a 6.5on a scale of 1-10 with 10 being great.Pt states afternoon plans of  homework. Patient demonstrates someprogress as evidenced by applying skills learned in group.Patient denies SI/HI/self-harm at the end of group.    Suicidal/Homicidal: Nowithout intent/plan  Plan: Pt will continue  in PHP while working to decrease depression and anxiety symptoms, increase daily functioning, and increase ability to manage symptoms as they arise.   Diagnosis: Severe episode of recurrent major depressive disorder, without psychotic features ([place]) [F33.2]    1. Severe episode of recurrent major depressive disorder, without psychotic features ([place])       [name], LCSW 04/19/2020

## 2020-04-19 NOTE — Psych (Signed)
Virtual Visit via Video Note  I connected with Amanda Fowler on 04/02/20 at  9:00 AM EDT by a video enabled telemedicine application and verified that I am speaking with the correct person using two identifiers.   I discussed the limitations of evaluation and management by telemedicine and the availability of in person appointments. The patient expressed understanding and agreed to proceed.  I discussed the assessment and treatment plan with the patient. The patient was provided an opportunity to ask questions and all were answered. The patient agreed with the plan and demonstrated an understanding of the instructions.   The patient was advised to call back or seek an in-person evaluation if the symptoms worsen or if the condition fails to improve as anticipated.  Pt was provided 240 minutes of non-face-to-face time during this encounter.   [name], LCSW    [facility] PHP THERAPIST PROGRESS NOTE  Amanda Fowler [id number]  Session Time: 9:00 - 10:00  Participation Level: Active  Behavioral Response: CasualAlertDepressed  Type of Therapy: Group Therapy  Treatment Goals addressed: Coping  Interventions: CBT, DBT, Supportive and Reframing  Summary:  Clinician led check-in regarding current stressors and situation, and review of patient completed daily inventory. Clinician utilized active listening and empathetic response and validated patient emotions. Clinician facilitated processing group on pertinent issues.   Therapist Response:Amanda Hamric is a 24 y.o. female who presents with depression and anxiety symptoms. Patient arrived within time allowed and reports that she is feeling "tired." Patient rates hermood at a 5on a scale of 1-10 with 10 being great. Pt reports she did not sleep well and felt stressed most of the afternoon. Pt states she tried to get school work done and took frequent distraction breaks to try to manage her stress. Pt states having  self-harm thoughts and managing them by painting. Pt is able to process. Patient engaged in discussion.      Session Time: 10:00 -11:00  Participation Level:Active  Behavioral Response:CasualAlertDepressed  Type of Therapy: Group Therapy, psychoeducation, psychotherapy  Treatment Goals addressed: Coping  Interventions:CBT, DBT, Solution Focused, Supportive and Reframing  Summary:Cln led discussion on procrastination. Group members discussed ways in which procrastination is an issue for them. Cln and group brainstormed ways to address procrastination including utilizing positive or negative reinforcement, using an accountability partner, time management tips, and addressing underlying concern behind what task is being put off.   Therapist Response:  Pt engaged in discussion and reports procrastination is a struggle for her. Pt able to process and reports willingness to try positive reinforcement to address procrastination.       Session Time: 11:00 -12:00  Participation Level:Active  Behavioral Response:CasualAlertDepressed  Type of Therapy: Group Therapy, psychotherapy  Treatment Goals addressed: Coping  Interventions:Strengths based, reframing, Supportive,   Summary:Spiritual Care group  Therapist Response: Patient engaged in group. See chaplain note.        Session Time: 12:00- 1:00  Participation Level:Active  Behavioral Response:CasualAlertDepressed  Type of Therapy: Group Therapy, Psychoeducation  Treatment Goals addressed: Coping  Interventions:relaxation training; Supportive; Reframing  Summary:12:00 - 12:50: Relaxation group: Cln led group focused on retraining the body's response to stress.  12:50 -1:00 Clinician led check-out. Clinician assessed for immediate needs, medication compliance and efficacy, and safety concerns  Therapist Response:Pt engaged in activity and discussion.  12:50 -  1:00: At check-out, patientrates hermood at a 8on a scale of 1-10 with 10 being great.Pt states afternoon plans of having lunch with her mom and working on quizzes. Patient demonstrates  someprogress as evidenced by increased ustilization of distraction skills.Patient denies SI/HI/self-harm at the end of group.    Suicidal/Homicidal: Nowithout intent/plan  Plan: Pt will continue in PHP while working to decrease depression and anxiety symptoms, increase daily functioning, and increase ability to manage symptoms as they arise.   Diagnosis: Severe episode of recurrent major depressive disorder, without psychotic features ([place]) [F33.2]    1. Severe episode of recurrent major depressive disorder, without psychotic features ([place])       [name], LCSW 04/19/2020

## 2020-04-19 NOTE — Psych (Signed)
Virtual Visit via Video Note  I connected with Amanda Fowler on 04/07/20 at  9:00 AM EDT by a video enabled telemedicine application and verified that I am speaking with the correct person using two identifiers.   I discussed the limitations of evaluation and management by telemedicine and the availability of in person appointments. The patient expressed understanding and agreed to proceed.  I discussed the assessment and treatment plan with the patient. The patient was provided an opportunity to ask questions and all were answered. The patient agreed with the plan and demonstrated an understanding of the instructions.   The patient was advised to call back or seek an in-person evaluation if the symptoms worsen or if the condition fails to improve as anticipated.  Pt was provided 240 minutes of non-face-to-face time during this encounter.   Amanda Guiles, LCSW    [facility] PHP THERAPIST PROGRESS NOTE  Amanda Fowler [id number]  Session Time: 9:00 - 10:00  Participation Level: Active  Behavioral Response: CasualAlertDepressed  Type of Therapy: Group Therapy  Treatment Goals addressed: Coping  Interventions: CBT, DBT, Supportive and Reframing  Summary:  Clinician led check-in regarding current stressors and situation, and review of patient completed daily inventory. Clinician utilized active listening and empathetic response and validated patient emotions. Clinician facilitated processing group on pertinent issues.   Therapist Response:Amanda Fowler is a 24 y.o. female who presents with depression and anxiety symptoms. Patient arrived within time allowed and reports that she is feeling "pretty good." Patient rates hermood at a 8on a scale of 1-10 with 10 being great. Pt reports she had a low key weekend and it was "really nice." Pt states having positive communication with her partner which encouraged her. Pt struggles with mood-dependent thinking. Pt is able  to process. Patient engaged in discussion.      Session Time: 10:00-11:00  Participation Level:Active  Behavioral Response:CasualAlertDepressed  Type of Therapy:Group Therapy  Treatment Goals addressed: Coping  Interventions:CBT, DBT, Solution Focused, Supportive and Reframing  Summary:Cln continued topic of feelings. Group utilized handout "Myths about emotions" to reinforce elements of feelings already discussed. Group shared ways in which the myths feel true and worked together to reframe the myth from a healthier standpoint.    Therapist Response: Pt engaged in discussion and activity. Pt demonstrates understanding of feelings and emotions.        Session Time: 11:00- 12:00  Participation Level:Active  Behavioral Response:CasualAlertDepressed  Type of Therapy:Group Therapy  Treatment Goals addressed: Coping  Interventions:CBT, DBT, Solution Focused, Supportive and Reframing  Summary:Cln introduced topic of distress tolerance skills. Cln discussed the purpose of distress tolerance skills, how to utilize them, and how they can help [place] in our recovery. Cln introduced the DBT STOP skill and how to practice it. Group members discussed how they could apply this skill in their own lives.  Therapist Response:  Pt engaged in discussion and reports understanding of distress tolerance skills. Pt reports she can use the STOP skill when ruminating.        Session Time: 12:00 -1:00  Participation Level:Active  Behavioral Response:CasualAlertDepressed  Type of Therapy:Group therapy  Treatment Goals addressed: Coping  Interventions:CBT; Solution focused; Supportive; Reframing  Summary:12:00 - 12:50: Cln continued topic of distress tolerance skills. Cln introduced the DBT TIP skill to help manage extreme emotions. Group discussed how they can utilize the TIP skill in their everyday life.  12:50 -1:00 Clinician led  check-out. Clinician assessed for immediate needs, medication compliance and efficacy, and safety concerns  Therapist  Response:12:00 - 12:50: Pt engaged in discussion and reports understanding of the TIP skill. Pt reports she is most likely to practice progressive muscle relaxation.    12:50 - 1:00: At check-out, patientrates hermood at a 9on a scale of 1-10 with 10 being great.Pt states afternoon plans of getting her nails done. Patient demonstrates someprogress as evidenced by increased positive events.Patient denies SI/HI/self-harm at the end of group.    Suicidal/Homicidal: Nowithout intent/plan  Plan: Pt will continue in PHP while working to decrease depression and anxiety symptoms, increase daily functioning, and increase ability to manage symptoms as they arise.   Diagnosis: Severe episode of recurrent major depressive disorder, without psychotic features ([place]) [F33.2]    1. Severe episode of recurrent major depressive disorder, without psychotic features ([place])       Amanda Glass, LCSW 04/19/2020

## 2020-04-19 NOTE — Psych (Signed)
Virtual Visit via Video Note  I connected with Amanda Fowler on 04/01/20 at  9:00 AM EDT by a video enabled telemedicine application and verified that I am speaking with the correct person using two identifiers.   I discussed the limitations of evaluation and management by telemedicine and the availability of in person appointments. The patient expressed understanding and agreed to proceed.  I discussed the assessment and treatment plan with the patient. The patient was provided an opportunity to ask questions and all were answered. The patient agreed with the plan and demonstrated an understanding of the instructions.   The patient was advised to call back or seek an in-person evaluation if the symptoms worsen or if the condition fails to improve as anticipated.  Pt was provided 240 minutes of non-face-to-face time during this encounter.   [name], LCSW    [facility] PHP THERAPIST PROGRESS NOTE  Amanda Fowler [id number]  Session Time: 9:00 - 10:00  Participation Level: Active  Behavioral Response: CasualAlertDepressed  Type of Therapy: Group Therapy  Treatment Goals addressed: Coping  Interventions: CBT, DBT, Supportive and Reframing  Summary:  Clinician led check-in regarding current stressors and situation, and review of patient completed daily inventory. Clinician utilized active listening and empathetic response and validated patient emotions. Clinician facilitated processing group on pertinent issues.   Therapist Response:Amanda Fowler is a 24 y.o. female who presents with depression and anxiety symptoms. Patient arrived within time allowed and reports that she is feeling "anxious." Patient rates hermood at a 7on a scale of 1-10 with 10 being great. Pt reports the meeting with her advisor yesterday went well however she has been anxious since then due to future-oriented thinking. Pt states a sales associate was rude to her and she continued to  ruminate about it through the day. Pt reports struggling with decision making. Pt is able to process. Patient engaged in discussion.      Session Time: 10:00-11:00  Participation Level:Active  Behavioral Response:CasualAlertDepressed  Type of Therapy:Group Therapy  Treatment Goals addressed: Coping  Interventions:CBT, DBT, Solution Focused, Supportive and Reframing  Summary:Cln led discussion on emotional regulation. Cln discussed how awareness of triggers and warning signs can aid in increasing ability to manage big emotions by catching them early. Cln encouraged pt's to utilize distraction skills as soon as they recognize a feeling is problematic for them.   Therapist Response:  Pt engaged in discussion and reports struggling with intervening when she identifies a feeling. Pt is able to process and identify one way to work on their emotional regulation.       Session Time: 11:00- 12:00  Participation Level:Active  Behavioral Response:CasualAlertDepressed  Type of Therapy: Group Therapy, OT  Treatment Goals addressed: Coping  Interventions:Psychosocial skills training, Supportive  Summary:Occupational Therapy group  Therapist Response:Patient engaged in group. See OT note.       Session Time: 12:00 -1:00  Participation Level:Active  Behavioral Response:CasualAlertDepressed  Type of Therapy:Group therapy  Treatment Goals addressed: Coping  Interventions:CBT; Solution focused; Supportive; Reframing  Summary:12:00 - 12:50: Cln introduced topic of healthy relationships. Group created list of traits they felt are important in a healthy relationships and discussed why they valued those traits. Cln presented honesty, trust, and respect as the foundation to a healthy relationship. Group members shared ways in which their current relationships stack up. Cln encouraged group members to create their own list of the  most important traits and to seek to exhibit those traits in order to attract them in others.  12:50 -1:00 Clinician led check-out. Clinician assessed for immediate needs, medication compliance and efficacy, and safety concerns  Therapist Response:12:00 - 12:50:Pt engaged in discussion and activity. Pt reports her relationships are moderately healthy and is able to identify ways to improve them. 12:50 - 1:00: At check-out, patientrates hermood at a 8on a scale of 1-10 with 10 being great.Pt states afternoon plans of painting and meeting with a professor. Patient demonstrates someprogress as evidenced by increasing assertiveness.Patient denies SI/HI/self-harm at the end of group.    Suicidal/Homicidal: Nowithout intent/plan  Plan: Pt will continue in PHP while working to decrease depression and anxiety symptoms, increase daily functioning, and increase ability to manage symptoms as they arise.   Diagnosis: Severe episode of recurrent major depressive disorder, without psychotic features (HCC) [F33.2]    1. Severe episode of recurrent major depressive disorder, without psychotic features (HCC)       [name], LCSW 04/19/2020

## 2020-04-19 NOTE — Psych (Signed)
Virtual Visit via Video Note  I connected with Amanda Fowler on 04/08/20 at  9:00 AM EDT by a video enabled telemedicine application and verified that I am speaking with the correct person using two identifiers.   I discussed the limitations of evaluation and management by telemedicine and the availability of in person appointments. The patient expressed understanding and agreed to proceed.  I discussed the assessment and treatment plan with the patient. The patient was provided an opportunity to ask questions and all were answered. The patient agreed with the plan and demonstrated an understanding of the instructions.   The patient was advised to call back or seek an in-person evaluation if the symptoms worsen or if the condition fails to improve as anticipated.  Pt was provided 240 minutes of non-face-to-face time during this encounter.   [name], LCSW    [facility] PHP THERAPIST PROGRESS NOTE  Lus Kriegel [id number]  Session Time: 9:00 - 10:00  Participation Level: Active  Behavioral Response: CasualAlertDepressed  Type of Therapy: Group Therapy  Treatment Goals addressed: Coping  Interventions: CBT, DBT, Supportive and Reframing  Summary:  Clinician led check-in regarding current stressors and situation, and review of patient completed daily inventory. Clinician utilized active listening and empathetic response and validated patient emotions. Clinician facilitated processing group on pertinent issues.   Therapist Response:Amanda Fowler is a 24 y.o. female who presents with depression and anxiety symptoms. Patient arrived within time allowed and reports that she is feeling "sleepy." Patient rates hermood at a 7on a scale of 1-10 with 10 being great. Pt reports she is feeling high stress regarding school and being behind in her schoolwork. Pt states she spent time cleaning yesterday. Pt struggles with procrastination. Pt is able to process.  Patient engaged in discussion.      Session Time: 10:00-11:00  Participation Level:Active  Behavioral Response:CasualAlertDepressed  Type of Therapy:Group Therapy  Treatment Goals addressed: Coping  Interventions:CBT, DBT, Solution Focused, Supportive and Reframing  Summary:Cln led discussion on reframing our perspective. Group members processed struggles with issues that felt unmovable and feeling "stuck." Cln utilized CBT to inform discussion on changing perspective tho decrease emotional activation. Group members worked to reframe struggles that were shared to decrease stress.  Therapist Response:  Pt engaged in discussion and reports understanding of reframing and its benefits. Pt participated in group's reframing efforts for one another's struggles.       Session Time: 11:00- 12:00  Participation Level:Active  Behavioral Response:CasualAlertDepressed  Type of Therapy: Group Therapy, OT  Treatment Goals addressed: Coping  Interventions:Psychosocial skills training, Supportive  Summary:Occupational Therapy group  Therapist Response:Patient engaged in group. See OT note.       Session Time: 12:00 -1:00  Participation Level:Active  Behavioral Response:CasualAlertDepressed  Type of Therapy:Group therapy  Treatment Goals addressed: Coping  Interventions:CBT; Solution focused; Supportive; Reframing  Summary:12:00 - 12:50: Cln continued topic of distress tolerance skills. Cln introduced the distraction ACCEPTS skills. Cln discussed the ways in which distraction can aid in coping as well as the role and limits it can play. Group discussed A-C-C-E skills and how they can utilize them in their current life.  12:50 -1:00 Clinician led check-out. Clinician assessed for immediate needs, medication compliance and efficacy, and safety concerns  Therapist Response:12:00 - 12:50:Pt engaged in discussion and reports  understanding of skills discussed. Pt reports she can utilize painting, podcasts, and video games to practice. 12:50 - 1:00: At check-out, patientrates hermood at a 8on a scale of 1-10 with 10 being  great.Pt states afternoon plans of working on school work. Patient demonstrates someprogress as evidenced by increased efforts on emotional regulation.Patient denies SI/HI/self-harm at the end of group.    Suicidal/Homicidal: Nowithout intent/plan  Plan: Pt will continue in PHP while working to decrease depression and anxiety symptoms, increase daily functioning, and increase ability to manage symptoms as they arise.   Diagnosis: MDD (major depressive disorder), recurrent severe, without psychosis (HCC) [F33.2]    1. MDD (major depressive disorder), recurrent severe, without psychosis (HCC)   2. Difficulty coping       [name], LCSW 04/19/2020

## 2020-04-20 NOTE — Psych (Signed)
Virtual Visit via Video Note  I connected with Amanda Fowler on 04/10/20 at  9:00 AM EDT by a video enabled telemedicine application and verified that I am speaking with the correct person using two identifiers.   I discussed the limitations of evaluation and management by telemedicine and the availability of in person appointments. The patient expressed understanding and agreed to proceed.  I discussed the assessment and treatment plan with the patient. The patient was provided an opportunity to ask questions and all were answered. The patient agreed with the plan and demonstrated an understanding of the instructions.   The patient was advised to call back or seek an in-person evaluation if the symptoms worsen or if the condition fails to improve as anticipated.  Pt was provided 240 minutes of non-face-to-face time during this encounter.   Amanda Glass, LCSW    [facility] PHP THERAPIST PROGRESS NOTE  Amanda Fowler [id number]  Session Time: 9:00 - 10:00  Participation Level: Active  Behavioral Response: CasualAlertDepressed  Type of Therapy: Group Therapy  Treatment Goals addressed: Coping  Interventions: CBT, DBT, Supportive and Reframing  Summary:  Clinician led check-in regarding current stressors and situation, and review of patient completed daily inventory. Clinician utilized active listening and empathetic response and validated patient emotions. Clinician facilitated processing group on pertinent issues.   Therapist Response:Amanda Fowler is a 24 y.o. female who presents with depression and anxiety symptoms. Patient arrived within time allowed and reports that she is feeling "sleepy." Patient rates hermood at a 5on a scale of 1-10 with 10 being great. Pt reports she was up late working on homework. Pt states she had negative self-talk and passive SI due to not completing as much as she wanted to. Pt struggles with recognizing progress. Pt is able  to process. Patient engaged in discussion.      Session Time: 10:00-11:00  Participation Level:Active  Behavioral Response:CasualAlertDepressed  Type of Therapy:Group Therapy  Treatment Goals addressed: Coping  Interventions:CBT, DBT, Solution Focused, Supportive and Reframing  Summary:Cln led discussion on the importance of rest. Group members shared their struggles with allowing themselves time to decompress, rest, and not be "productive" and where these messages originated from and how they are reinforced.   Therapist Response: Pt engaged in discussion and reports receiving messages throughout their life regarding rest being something that has to be earned. Pt is able to process.        Session Time: 11:00- 12:00  Participation Level:Active  Behavioral Response:CasualAlertDepressed  Type of Therapy:Group Therapy  Treatment Goals addressed: Coping  Interventions:CBT, DBT, Solution Focused, Supportive and Reframing  Summary:Cln led discussion on positive self-talk and how negative self-esteem affects [place]. Group members shared stuggles they experience with self-esteem and feeling positive about themselves. Cln synthesized CBT reframing, the "best friend test," positive affirmations, and self coaching as ways to address negative self view. Cln encouraged pt's to consider they are not requried to change themselves, for they are worthy and fantastic already, but need to change their inability to see their fantastic qualities.   Therapist Response: Pt engaged in discussion and shares negative selk talk is an issue for her and often leads to rumination. Pt able to process and states she will use self coaching as a way to improve her self-talk.        Session Time: 12:00 -1:00  Participation Level:Active  Behavioral Response:CasualAlertDepressed  Type of Therapy:Group therapy  Treatment Goals addressed:  Coping  Interventions:CBT; Solution focused; Supportive; Reframing  Summary:12:00 - 12:50: Cln  continued topic of distress tolerance skills. Cln reviewed the previously discussed ACCEPTS skills. Group discussed P-T-S skills and how they can utilize them in their current life.   12:50 -1:00 Clinician led check-out. Clinician assessed for immediate needs, medication compliance and efficacy, and safety concerns  Therapist Response:12:00 - 12:50: Pt engaged in discussion and reports understanding of skills discussed. Pt reports she can use counting colors to practice. 12:50 - 1:00: At check-out, patientrates hermood at a 9on a scale of 1-10 with 10 being great.Pt states afternoon plans of going [facility] the science center. Patient demonstrates someprogress as evidenced by increased goal directed behaviors.Patient denies SI/HI/self-harm at the end of group.    Suicidal/Homicidal: Nowithout intent/plan  Plan: Pt will continue in PHP while working to decrease depression and anxiety symptoms, increase daily functioning, and increase ability to manage symptoms as they arise.   Diagnosis: Severe episode of recurrent major depressive disorder, without psychotic features (HCC) [F33.2]    1. Severe episode of recurrent major depressive disorder, without psychotic features (HCC)       Amanda Guiles, LCSW 04/20/2020

## 2020-04-20 NOTE — Psych (Signed)
Virtual Visit via Video Note  I connected with [name] Ruttan on 04/11/20 at  9:00 AM EDT by a video enabled telemedicine application and verified that I am speaking with the correct person using two identifiers.   I discussed the limitations of evaluation and management by telemedicine and the availability of in person appointments. The patient expressed understanding and agreed to proceed.  I discussed the assessment and treatment plan with the patient. The patient was provided an opportunity to ask questions and all were answered. The patient agreed with the plan and demonstrated an understanding of the instructions.   The patient was advised to call back or seek an in-person evaluation if the symptoms worsen or if the condition fails to improve as anticipated.  Pt was provided 240 minutes of non-face-to-face time during this encounter.   [name], LCSW    [facility] PHP THERAPIST PROGRESS NOTE  Tache Bobst [id number]  Session Time: 9:00 - 10:00  Participation Level: Active  Behavioral Response: CasualAlertDepressed  Type of Therapy: Group Therapy  Treatment Goals addressed: Coping  Interventions: CBT, DBT, Supportive and Reframing  Summary:  Clinician led check-in regarding current stressors and situation, and review of patient completed daily inventory. Clinician utilized active listening and empathetic response and validated patient emotions. Clinician facilitated processing group on pertinent issues.   Therapist Response:[name] Kreuser is a 24 y.o. female who presents with depression and anxiety symptoms. Patient arrived within time allowed and reports that she is feeling "good." Patient rates hermood at a 7on a scale of 1-10 with 10 being great. Pt reports she had a "really good day yesterday." Pt states she made effort to stay present with some success. Pt states she slept well. Pt is able to process. Patient engaged in  discussion.      Session Time: 10:00-11:00  Participation Level:Active  Behavioral Response:CasualAlertDepressed  Type of Therapy:Group Therapy  Treatment Goals addressed: Coping  Interventions:CBT, DBT, Solution Focused, Supportive and Reframing  Summary:Cln led discussion on balance between staying present and planning ahead. Cln and group discussed ways to differentiate between worrying and problem solving. Group discussed ways to establish routine and introduce new patterns while in safer environments. Group members shared their struggle with ruminations and being overwhelmed with the future and how these topics can be used to address those symptoms.   Therapist Response: Pt engaged in discussion and reports concerns about returning to "normal" life and keeping up healthy habits. Pt is able to process and identify new strategies to try.       Session Time: 11:00- 12:00  Participation Level:Active  Behavioral Response:CasualAlertDepressed  Type of Therapy: Group Therapy, OT  Treatment Goals addressed: Coping  Interventions:Psychosocial skills training, Supportive  Summary:Occupational Therapy group  Therapist Response:Patient engaged in group. See OT note.       Session Time: 12:00 -1:00  Participation Level:Active  Behavioral Response:CasualAlertDepressed  Type of Therapy:Group therapy  Treatment Goals addressed: Coping  Interventions:CBT; Solution focused; Supportive; Reframing  Summary:12:00 - 12:50: Cln introduced topic of valuing yourself. Group viewed TED talk "The person you really need to marry" and discussed how rethinking the relationship with ourselves could improve self-esteem. Group members processed messages they have reinforced with themselves that they would not enforce in others and how it has affected them.  12:50 -1:00 Clinician led check-out. Clinician assessed for immediate needs,  medication compliance and efficacy, and safety concerns  Therapist Response:12:00 - 12:50:Pt engaged in discussion and reports putting limited effort into the relationship with herself.  12:50 - 1:00: At check-out, patientrates hermood at a 6on a scale of 1-10 with 10 being great.Pt states afternoon plans of returning to her apartment and plaing video games. Patient demonstrates someprogress as evidenced by improvement in managing symptoms.Patient denies SI/HI/self-harm at the end of group.    Suicidal/Homicidal: Nowithout intent/plan  Plan: Pt will continue in PHP while working to decrease depression and anxiety symptoms, increase daily functioning, and increase ability to manage symptoms as they arise.   Diagnosis: Severe episode of recurrent major depressive disorder, without psychotic features ([place]) [F33.2]    1. Severe episode of recurrent major depressive disorder, without psychotic features ([place])       [name], LCSW 04/20/2020

## 2020-04-20 NOTE — Psych (Signed)
Virtual Visit via Video Note  I connected with Amanda Fowler on 04/14/20 at  9:00 AM EDT by a video enabled telemedicine application and verified that I am speaking with the correct person using two identifiers.   I discussed the limitations of evaluation and management by telemedicine and the availability of in person appointments. The patient expressed understanding and agreed to proceed.  I discussed the assessment and treatment plan with the patient. The patient was provided an opportunity to ask questions and all were answered. The patient agreed with the plan and demonstrated an understanding of the instructions.   The patient was advised to call back or seek an in-person evaluation if the symptoms worsen or if the condition fails to improve as anticipated.  Pt was provided 240 minutes of non-face-to-face time during this encounter.   [name], LCSW    [facility] PHP THERAPIST PROGRESS NOTE  Amanda Fowler [id number]  Session Time: 9:00 - 10:00  Participation Level: Active  Behavioral Response: CasualAlertDepressed  Type of Therapy: Group Therapy  Treatment Goals addressed: Coping  Interventions: CBT, DBT, Supportive and Reframing  Summary:  Clinician led check-in regarding current stressors and situation, and review of patient completed daily inventory. Clinician utilized active listening and empathetic response and validated patient emotions. Clinician facilitated processing group on pertinent issues.   Therapist Response:Amanda Ressler is a 24 y.o. female who presents with depression and anxiety symptoms. Patient arrived within time allowed and reports that she is feeling "anxious." Patient rates hermood at a 4on a scale of 1-10 with 10 being great. Pt reports she moved back to her apartment over the weekend because she "felt like I should." Pt states she has been in a negative mental head space since returning and identified feeling lonely,  anxious, and sad. Pt struggles with "shoulds." Pt is able to process. Patient engaged in discussion.      Session Time: 10:00-11:00  Participation Level:Active  Behavioral Response:CasualAlertDepressed  Type of Therapy:Group Therapy  Treatment Goals addressed: Coping  Interventions:CBT, DBT, Solution Focused, Supportive and Reframing  Summary:Cln led discussion on the ways our willingness/resistance to be vulnerable affects our relationships. Group members shared struggles they encounter with being vulnerable including trust issues, poor self-esteem, and not trusting themselved with negative feelings. Group was given space to process and receive and give support.   Therapist Response: Pt engaged in discussion and reports vulnerability is difficult for her due to issues with self-esteem. Pt is able to process.        Session Time: 11:00- 12:00  Participation Level:Active  Behavioral Response:CasualAlertDepressed  Type of Therapy:Group Therapy  Treatment Goals addressed: Coping  Interventions:CBT, DBT, Solution Focused, Supportive and Reframing  Summary:Cln led discussion on family dynamics and how our family of origin and the messages we receive affect [place] moving forward. Group members shared and connected with one another regarding feeling unseen in their families and the ways in which it affected their self-esteem and relationships. Grop members are able to process and recieve support. Cln utilized thought challenging to reframe and offer alternative perspectives.  Therapist Response: Pt engaged in discussion. Pt was able to process ways in which she felt unseen in her family and how it affects her. Pt is able to reframe and offer a kinder perspective on the situation that can encourage healing.        Session Time: 12:00 -1:00  Participation Level:Active  Behavioral Response:CasualAlertDepressed  Type of  Therapy:Group therapy  Treatment Goals addressed: Coping  Interventions:CBT; Solution focused;  Supportive; Reframing  Summary:12:00 - 12:50: Cln introduced topic of cognitive distortions. Cln utilized CBT model to illustrate how out thoughts affect feelings and fuel our reactions. Group members discussed ways in which they notice their feelings overtake their actions and cause issues for them.  12:50 -1:00 Clinician led check-out. Clinician assessed for immediate needs, medication compliance and efficacy, and safety concerns  Therapist Response:12:00 - 12:50: Pt engaged in discussion and reports understanding of CBT model and how it can be used to decrease negative consequences for our mindset, relationships, and mental health.  12:50 - 1:00: At check-out, patientrates hermood at a 6.5on a scale of 1-10 with 10 being great.Pt states afternoon plans of showering, painting, homework, and cleaning. Patient demonstrates someprogress as evidenced by coping with her loneliness over the weekend.Patient denies SI/HI/self-harm at the end of group.    Suicidal/Homicidal: Nowithout intent/plan  Plan: Pt will discharge from PHP due to meeting treatment goals of decreased depression and anxiety symptoms, increased daily functioning, and increased ability to manage symptoms as they arise. Pt will step down to IOP within this agency on 04/16/20 to increase further stability. Pt and provider are aligned with discharge. Pt denies SI/HI/self harm at discharge.   Diagnosis: MDD (major depressive disorder), recurrent severe, without psychosis ([place]) [F33.2]    1. MDD (major depressive disorder), recurrent severe, without psychosis ([place])   2. Difficulty coping       [name], LCSW 04/20/2020

## 2020-04-21 ENCOUNTER — Other Ambulatory Visit: Payer: Self-pay

## 2020-04-21 ENCOUNTER — Other Ambulatory Visit (HOSPITAL_COMMUNITY): Payer: BC Managed Care – PPO | Admitting: Licensed Clinical Social Worker

## 2020-04-21 DIAGNOSIS — F419 Anxiety disorder, unspecified: Secondary | ICD-10-CM | POA: Diagnosis not present

## 2020-04-21 DIAGNOSIS — Z658 Other specified problems related to psychosocial circumstances: Secondary | ICD-10-CM | POA: Diagnosis not present

## 2020-04-21 DIAGNOSIS — F909 Attention-deficit hyperactivity disorder, unspecified type: Secondary | ICD-10-CM | POA: Diagnosis not present

## 2020-04-21 DIAGNOSIS — Z760 Encounter for issue of repeat prescription: Secondary | ICD-10-CM | POA: Diagnosis not present

## 2020-04-21 DIAGNOSIS — Z558 Other problems related to education and literacy: Secondary | ICD-10-CM | POA: Diagnosis not present

## 2020-04-21 DIAGNOSIS — R45851 Suicidal ideations: Secondary | ICD-10-CM | POA: Diagnosis not present

## 2020-04-21 DIAGNOSIS — Z793 Long term (current) use of hormonal contraceptives: Secondary | ICD-10-CM | POA: Diagnosis not present

## 2020-04-21 DIAGNOSIS — F332 Major depressive disorder, recurrent severe without psychotic features: Secondary | ICD-10-CM

## 2020-04-21 DIAGNOSIS — F17210 Nicotine dependence, cigarettes, uncomplicated: Secondary | ICD-10-CM | POA: Diagnosis not present

## 2020-04-21 DIAGNOSIS — Z79899 Other long term (current) drug therapy: Secondary | ICD-10-CM | POA: Diagnosis not present

## 2020-04-21 DIAGNOSIS — F339 Major depressive disorder, recurrent, unspecified: Secondary | ICD-10-CM | POA: Diagnosis not present

## 2020-04-22 ENCOUNTER — Other Ambulatory Visit (HOSPITAL_COMMUNITY): Payer: BC Managed Care – PPO | Admitting: Licensed Clinical Social Worker

## 2020-04-22 ENCOUNTER — Other Ambulatory Visit: Payer: Self-pay

## 2020-04-22 DIAGNOSIS — F332 Major depressive disorder, recurrent severe without psychotic features: Secondary | ICD-10-CM

## 2020-04-22 DIAGNOSIS — F419 Anxiety disorder, unspecified: Secondary | ICD-10-CM | POA: Diagnosis not present

## 2020-04-22 DIAGNOSIS — Z79899 Other long term (current) drug therapy: Secondary | ICD-10-CM | POA: Diagnosis not present

## 2020-04-22 DIAGNOSIS — F339 Major depressive disorder, recurrent, unspecified: Secondary | ICD-10-CM | POA: Diagnosis not present

## 2020-04-22 DIAGNOSIS — F909 Attention-deficit hyperactivity disorder, unspecified type: Secondary | ICD-10-CM | POA: Diagnosis not present

## 2020-04-22 DIAGNOSIS — R45851 Suicidal ideations: Secondary | ICD-10-CM | POA: Diagnosis not present

## 2020-04-22 DIAGNOSIS — Z760 Encounter for issue of repeat prescription: Secondary | ICD-10-CM | POA: Diagnosis not present

## 2020-04-22 DIAGNOSIS — Z793 Long term (current) use of hormonal contraceptives: Secondary | ICD-10-CM | POA: Diagnosis not present

## 2020-04-22 DIAGNOSIS — Z658 Other specified problems related to psychosocial circumstances: Secondary | ICD-10-CM | POA: Diagnosis not present

## 2020-04-22 DIAGNOSIS — F17210 Nicotine dependence, cigarettes, uncomplicated: Secondary | ICD-10-CM | POA: Diagnosis not present

## 2020-04-22 DIAGNOSIS — Z558 Other problems related to education and literacy: Secondary | ICD-10-CM | POA: Diagnosis not present

## 2020-04-22 DIAGNOSIS — R4589 Other symptoms and signs involving emotional state: Secondary | ICD-10-CM

## 2020-04-22 DIAGNOSIS — F411 Generalized anxiety disorder: Secondary | ICD-10-CM

## 2020-04-22 NOTE — Progress Notes (Signed)
Virtual Visit via Video Note  I connected with [name] Dagher on 04/22/20 at  9:00 AM EDT by a video enabled telemedicine application and verified that I am speaking with the correct person using two identifiers.   Case Manager discussed the limitations of evaluation and management by telemedicine and the availability of in person appointments. The patient expressed understanding and agreed to proceed.  Location:  Patient: Patient Home Provider: [facility] OPT Office  History of Present Illness: MDD  Observations/Objective: Case Manager checked in with all participants to review discharge dates, insurance authorizations, work-related documents and needs for the treatment team. Clinician facilitated a check-in with group members to assess mood and current functioning. Clinician introduced self and prompted client to provide an update on functioning since last group meeting. Clinician shared guided meditation 'Clear Your Mind' and encouraged members to engage in mindfulness as well as provide feedback on their experience. Client was active throughout check in and introduced self to clinician providing detail into events that led to current engagement in IOP services which include a hospitalization due to increasing SI followed by completing Partial Hospitalization Program and stepping down into this group. Client provided insight into her hx with depressive, anxiety, and OCD sx beginning when she was young. 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 Client painted throughout session and verbalized this as a calming technique for her that assists in focusing. Client reported enjoying shared meditation and discussed feeling relaxed in areas that she typically carries stress such as her shoulders and neck. Client did not endorse SI/HI/psychosis during this  engagement.  Psycho-educational portion of group centered on Cognitive Distortions and unhealthy thinking patterns. Clinician provided oversight on distorted thinking patters and shared video 'Cognitive Distortion's and allowed space for members to share their responses. Clinician provided psycho-educational handouts titled 'Cognitive Distortions' and 'Challenging Negative Thoughts' and prompted members to identify any distorted thinking patterns that they engage in while processing feelings associated with those thoughts as well as behaviors. Clinician validated members feelings and engaged in discussion on the importance in recognizing these thoughts in order to make change. Clinician provided CBT thought record to assist members in identifying alternate thoughts on their own. Clinician facilitated check out and assessed for 1 self-care activity prior to tomorrow's group. Client reported that she finds herself engaging in fortune telling and catastrophizing and provided an example in which she will receive work evaluations with many areas of praise however receiving one criticism or negative feedback will result in her feeling guilt, shame, and that she will not be successful or is a bad employee. Client actively engaged in walking through challenging this thought however continued to endorse additional distorted thinking patterns while doing so. Client reported that it might be helpful for her to challenge these thoughts by assessing for evidence contrary to her distorted thinking patterns. Client reported that she plans to shower and take time to herself this afternoon as she continues to feel anxious regarding school work and tasks "I should do".   Assessment and Plan: Clinician recommends that patient remains in IOP treatment to better manage mental health symptoms and continue to address treatment plan goals. Clinician recommends adherence to crisis/safety plan, taking medications as prescribed, and  following up with medical professionals if any issues arise.  Follow Up Instructions: Clinician will send Webex link for next session. The patient was  advised to call back or seek an in-person evaluation if the symptoms worsen or if the condition fails to improve as anticipated.   The patient was advised to call back or seek an in-person evaluation if the symptoms worsen or if the condition fails to improve as anticipated.  I provided 180 minutes of non-face-to-face time during this encounter.   Renee Harder, LCSW

## 2020-04-23 ENCOUNTER — Other Ambulatory Visit: Payer: Self-pay

## 2020-04-23 ENCOUNTER — Other Ambulatory Visit (HOSPITAL_COMMUNITY): Payer: BC Managed Care – PPO | Admitting: Licensed Clinical Social Worker

## 2020-04-23 DIAGNOSIS — F909 Attention-deficit hyperactivity disorder, unspecified type: Secondary | ICD-10-CM | POA: Diagnosis not present

## 2020-04-23 DIAGNOSIS — F419 Anxiety disorder, unspecified: Secondary | ICD-10-CM | POA: Diagnosis not present

## 2020-04-23 DIAGNOSIS — F17210 Nicotine dependence, cigarettes, uncomplicated: Secondary | ICD-10-CM | POA: Diagnosis not present

## 2020-04-23 DIAGNOSIS — Z793 Long term (current) use of hormonal contraceptives: Secondary | ICD-10-CM | POA: Diagnosis not present

## 2020-04-23 DIAGNOSIS — F411 Generalized anxiety disorder: Secondary | ICD-10-CM

## 2020-04-23 DIAGNOSIS — R45851 Suicidal ideations: Secondary | ICD-10-CM | POA: Diagnosis not present

## 2020-04-23 DIAGNOSIS — Z79899 Other long term (current) drug therapy: Secondary | ICD-10-CM | POA: Diagnosis not present

## 2020-04-23 DIAGNOSIS — F332 Major depressive disorder, recurrent severe without psychotic features: Secondary | ICD-10-CM

## 2020-04-23 DIAGNOSIS — F339 Major depressive disorder, recurrent, unspecified: Secondary | ICD-10-CM | POA: Diagnosis not present

## 2020-04-23 DIAGNOSIS — Z658 Other specified problems related to psychosocial circumstances: Secondary | ICD-10-CM | POA: Diagnosis not present

## 2020-04-23 DIAGNOSIS — Z760 Encounter for issue of repeat prescription: Secondary | ICD-10-CM | POA: Diagnosis not present

## 2020-04-23 DIAGNOSIS — Z558 Other problems related to education and literacy: Secondary | ICD-10-CM | POA: Diagnosis not present

## 2020-04-23 NOTE — Progress Notes (Signed)
Virtual Visit via Video Note   I connected with Amanda Fowler, who prefers to go by "Amanda Fowler" on 04/23/20 at 9:00 AM EST by a video enabled telemedicine application and verified that I am speaking with the correct person using two identifiers.   Location: Patient: Patient Home Provider: Clinical Home Office   Case Manager discussed the limitations of evaluation and management by telemedicine and the availability of in person appointments during orientation. The patient expressed understanding and agreed to proceed.   History of Present Illness: MDD, and GAD    Observations/Objective: Case Manager checked in with all participants to review discharge dates, insurance authorizations, work-related documents and needs for the treatment team. Counselor facilitated a check-in with group members to gauge mood and current functioning as well as identify recent progress towards treatment goals.  Amanda Fowler, who prefers to go by "Amanda Fowler" presented to group session on time and was alert, oriented x5, with no evidence or self-report of SI/HI or A/V H.  Amanda Fowler reported scores of 3/10 for depression and 8/10 for anxiety today.  Amanda Fowler reported that she has been doing well, and one of her successes was going out with friends to socialize at a bar last night.  Amanda Fowler reported that she has been struggling with isolation and low motivation, so this was not easy to do, but she was grateful for pushing herself afterward, stating "It was a small group of friends and I didn't get anxious".  She reported that her plan today is to ask her boyfriend to assist her with picking up her car, and then look into notaries around the area to work towards goal of moving soon, which is one reason for her anxiety level being higher today.    Counselor introduced topic of self-esteem today and defined this as the value an individual places on oneself, based upon assessment of personal worth as a human being and approval/disapproval of one's behavior.  Counselor asked members to assess their level of self-esteem at this time based upon common indicators of high self-esteem, including: accepting oneself unconditionally;  having self-respect and deep seated belief that one matters; being unaffected by other people's opinions/criticisms; and showing good control over emotions.  Counselor also explained concept of one's inner critic which serves to highlight faults and minimize strengths, directly influencing low sense of self-esteem.  Counselor then provided handout on 'strengths and qualities', which featured questions to guide discussion and increase awareness of each member's unique individual abilities which could reinforce higher self-esteem. Examples of questions included: 'things I am good at', 'challenges I have overcome', and 'what I like about myself'.  Intervention was effective, as evidenced by Amanda Fowler engaging in discussion on the subject and reporting that she believes that she has a fairly balanced perspective on her self-esteem.  Amanda Fowler admitted that there are some things about herself which she would like to change to improve self-image, including messiness, 'OCD stuff', and noted that she can become overwhelmed with tasks like laundry at times when she 'falls into an emotional black hole'.  Amanda Fowler reported that at times she avoids challenges, doesn't put effort into dressing nice, and will brush off compliments as well.  Amanda Fowler also reported that she could work on boundaries to improve self-esteem, stating "Sometimes I think that I always have to be kind, which is a good virtue, but it makes me less confrontational and assertive".  Amanda Fowler reported that this leads her to view herself as high-maintenance at times and avoid asking for help.  She was  open to the idea of integrating self-evaluations into her journaling routine to identify negative core beliefs and begin challenging them more.  Amanda Fowler reported that she was able to think of some positive traits she has, including  creativity, persistence, determination, kindness, intelligence, and trendiness.  Amanda Fowler was also able to identify some challenges she successfully overcame, including passing high school, making it 5 years into college, joining a fraternity, doing laundry recently and outreaching professors to get more assistance with classes.  She was also able to think of some positive self-care activities to include in routine, such as reflecting on past accomplishments to increase motivation, planning vacations/trips to look forward to and treating herself to a picnic.      Assessment and Plan: Counselor recommends that patient remains in IOP treatment to better manage mental health symptoms and continue to address treatment plan goals. Counselor recommends adherence to crisis/safety plan, taking medications as prescribed and following up with medical professionals if any issues arise.    Follow Up Instructions: Counselor will send Webex link for next session.  The patient was advised to call back or seek an in-person evaluation if the symptoms worsen or if the condition fails to improve as anticipated.   I provided 165 minutes of non-face-to-face time during this encounter.     Noralee Stain, LCSW, LCAS

## 2020-04-24 ENCOUNTER — Other Ambulatory Visit: Payer: Self-pay

## 2020-04-24 ENCOUNTER — Other Ambulatory Visit (HOSPITAL_COMMUNITY): Payer: BC Managed Care – PPO

## 2020-04-25 ENCOUNTER — Other Ambulatory Visit (HOSPITAL_COMMUNITY): Payer: BC Managed Care – PPO | Admitting: Licensed Clinical Social Worker

## 2020-04-25 ENCOUNTER — Other Ambulatory Visit: Payer: Self-pay

## 2020-04-25 DIAGNOSIS — F411 Generalized anxiety disorder: Secondary | ICD-10-CM

## 2020-04-25 DIAGNOSIS — Z793 Long term (current) use of hormonal contraceptives: Secondary | ICD-10-CM | POA: Diagnosis not present

## 2020-04-25 DIAGNOSIS — Z79899 Other long term (current) drug therapy: Secondary | ICD-10-CM | POA: Diagnosis not present

## 2020-04-25 DIAGNOSIS — Z658 Other specified problems related to psychosocial circumstances: Secondary | ICD-10-CM | POA: Diagnosis not present

## 2020-04-25 DIAGNOSIS — F419 Anxiety disorder, unspecified: Secondary | ICD-10-CM | POA: Diagnosis not present

## 2020-04-25 DIAGNOSIS — F17210 Nicotine dependence, cigarettes, uncomplicated: Secondary | ICD-10-CM | POA: Diagnosis not present

## 2020-04-25 DIAGNOSIS — R45851 Suicidal ideations: Secondary | ICD-10-CM | POA: Diagnosis not present

## 2020-04-25 DIAGNOSIS — Z760 Encounter for issue of repeat prescription: Secondary | ICD-10-CM | POA: Diagnosis not present

## 2020-04-25 DIAGNOSIS — Z558 Other problems related to education and literacy: Secondary | ICD-10-CM | POA: Diagnosis not present

## 2020-04-25 DIAGNOSIS — F909 Attention-deficit hyperactivity disorder, unspecified type: Secondary | ICD-10-CM | POA: Diagnosis not present

## 2020-04-25 DIAGNOSIS — F339 Major depressive disorder, recurrent, unspecified: Secondary | ICD-10-CM | POA: Diagnosis not present

## 2020-04-25 DIAGNOSIS — F332 Major depressive disorder, recurrent severe without psychotic features: Secondary | ICD-10-CM

## 2020-04-25 NOTE — Progress Notes (Signed)
Virtual Visit via Video Note   I connected with Amanda Fowler, who prefers to go by "AK" on 04/25/20 at 9:00 AM EST by a video enabled telemedicine application and verified that I am speaking with the correct person using two identifiers.   Location: Patient: Patient Home Provider: [facility] Office   Case Manager discussed the limitations of evaluation and management by telemedicine and the availability of in person appointments during orientation. The patient expressed understanding and agreed to proceed.   History of Present Illness: MDD   Observations/Objective: Case Manager checked in with all participants to review discharge dates, insurance authorizations, work-related documents and needs for the treatment team. Counselor facilitated a check-in with group members to gauge mood and current functioning as well as identify recent progress towards treatment goals.  Amanda Fowler, who prefers to go by "AK" presented to group session on time and was alert, oriented x5, with no evidence or self-report of current SI/HI or A/V H.  AK reported scores of 2/10 for depression and 6/10 for anxiety today.  AK reported that she missed group yesterday because she was out late the night before socializing with her roommates, and needed time to rest the following day.  AK reported that it is still difficult at times to go out because of anxiety, but she is gradually desensitizing herself to irrational thoughts which arise at times in these settings.  AK reported that her plan for the weekend is to prepare for a beach trip with family, which she hopes will offer a break from stress.      Counselor introduced topic of assertive communication today.  Counselor shared various handouts with members virtually in group to read along with on the subject.  These handouts defined assertive communication as a communication style in which a person stands up for their own needs and wants, while also taking into  consideration the needs and wants of others, without behaving in a passive or aggressive way.  Traits of assertive communicators were highlighted such as using appropriate speaking volume, maintaining eye contact, using confident language, and avoiding interruption.  Members were also provided with tips on how to improve communication, including respecting oneself, expressing thoughts and feelings calmly, and saying "No" when necessary.  Members were given a variety of scenarios where they could practice using these tips to respond in an assertive manner.  Intervention was effective, as evidenced by AK participating in discussion on this topic, along with exercises.  She reported that she tends to have a passive communication style and will often weigh the pros and cons of pushing an issue, but tends to oftentimes give in to demands of the other party, which "Makes me feel little, unheard, and unimportant".  AK reported that as a result, she tends to be soft spoken, and show mixed emotions when trying to address issues, such as laughing when she means to express anger.  AK reported that when she has tried to be assertive in the past, she worries that she will come off as aggressive or hurtful towards others, so one way of getting around this has been to write letters expressing her feelings and requests.  AK was able to successfully provide assertive responses to roleplay scenarios today, utilizing techniques such as 'broken record', empathetic assertion, and consequence assertion.  AK reported that this subject was helpful to cover today, as it prepared her for addressing future issues with people in her family, partner, and others.  Counselor ended session by acknowledging a graduating  group member by prompting graduating member to reflect on progress made, takeaways from treatment and plan for stepping down. Counselor and group members shared observations of growth, encouragement and support as they transition  out of the program. AK provided supportive feedback to the member leaving group today and showed appreciation for the time they shared together interacting in group.     Assessment and Plan: Counselor recommends that patient remains in IOP treatment to better manage mental health symptoms and continue to address treatment plan goals. Counselor recommends adherence to crisis/safety plan, taking medications as prescribed and following up with medical professionals if any issues arise.    Follow Up Instructions: Counselor will send Webex link for next session.  The patient was advised to call back or seek an in-person evaluation if the symptoms worsen or if the condition fails to improve as anticipated.   I provided 180 minutes of non-face-to-face time during this encounter.     Shade Flood, LCSW, LCAS

## 2020-04-28 ENCOUNTER — Other Ambulatory Visit (HOSPITAL_COMMUNITY): Payer: BC Managed Care – PPO | Attending: Psychiatry | Admitting: Licensed Clinical Social Worker

## 2020-04-28 ENCOUNTER — Other Ambulatory Visit: Payer: Self-pay

## 2020-04-28 DIAGNOSIS — F329 Major depressive disorder, single episode, unspecified: Secondary | ICD-10-CM | POA: Insufficient documentation

## 2020-04-28 DIAGNOSIS — F339 Major depressive disorder, recurrent, unspecified: Secondary | ICD-10-CM | POA: Diagnosis not present

## 2020-04-28 DIAGNOSIS — F411 Generalized anxiety disorder: Secondary | ICD-10-CM

## 2020-04-28 DIAGNOSIS — F419 Anxiety disorder, unspecified: Secondary | ICD-10-CM | POA: Insufficient documentation

## 2020-04-28 DIAGNOSIS — F332 Major depressive disorder, recurrent severe without psychotic features: Secondary | ICD-10-CM

## 2020-04-28 NOTE — Progress Notes (Signed)
Virtual Visit via Video Note   I connected with Amanda Fowler, who prefers to go by "AK" on 04/28/20 at 9:00 AM EST by a video enabled telemedicine application and verified that I am speaking with the correct person using two identifiers.   Location: Patient: Patient Home Provider: Clinical Home Office   Case Manager discussed the limitations of evaluation and management by telemedicine and the availability of in person appointments during orientation. The patient expressed understanding and agreed to proceed.   History of Present Illness: MDD, and GAD    Observations/Objective: Case Manager checked in with all participants to review discharge dates, insurance authorizations, work-related documents and needs for the treatment team. Counselor facilitated a check-in with group members to gauge mood and current functioning as well as identify recent progress towards treatment goals.  [name], who prefers to go by "AK" presented to group session on time and was alert, oriented x5, with no evidence or self-report of SI/HI or A/V H.  AK reported scores of 3/10 for depression and 6/10 for anxiety today.  AK reported that she is currently at the beach with family today and has been enjoying her vacation overall.  AK reported that she plans to go shopping at some outlets with her mother later today and try to avoid going to sleep, as she has been getting somewhere between 8-15 hours over the night and days for some time now.  Counselor encouraged her to speak with her psychiatrist about this issue to see if a change in medication regimen may benefit her.     Counselor introduced topic of self-care today.  Counselor explained how this can be defined as the things one does to maintain good health and improve well-being.  Counselor provided members with a self-care assessment form to complete.  This handout featured various sub-categories of self-care, including physical, psychological/emotional, social,  spiritual, and professional.  Members were asked to rank their engagement in the activities listed for each dimension on a scale of 1-3, with 1 indicating 'Poor', 2 indicating 'Ok', and 3 indicating 'Well'.  Counselor invited members to share results of their assessment, and inquired about which areas of self-care they are doing well in, as well as areas that require attention, and how they plan to begin addressing this during treatment. Intervention was effective, as evidenced by AK engaging in discussion on the subject and participating in self-care assessment with group.  She reported that some areas that she needs to concentrate on based upon lower scores include eating healthier to improve energy and manage weight, spending more time towards positive thought and reflection, and reduce time around people like her roommates that put her in a bad mood.  AK reported that her hygiene has been improving along with mental health, she has been trying to get out and socialize more, and is making effort to be more present minded rather than worrying about the future as often.    Assessment and Plan: Counselor recommends that patient remains in IOP treatment to better manage mental health symptoms and continue to address treatment plan goals. Counselor recommends adherence to crisis/safety plan, taking medications as prescribed and following up with medical professionals if any issues arise.    Follow Up Instructions: Counselor will send Webex link for next session.  The patient was advised to call back or seek an in-person evaluation if the symptoms worsen or if the condition fails to improve as anticipated.   I provided 180 minutes of non-face-to-face time during this encounter.  Shade Flood, LCSW, LCAS

## 2020-04-29 ENCOUNTER — Other Ambulatory Visit (HOSPITAL_COMMUNITY): Payer: BC Managed Care – PPO | Admitting: Licensed Clinical Social Worker

## 2020-04-29 ENCOUNTER — Other Ambulatory Visit: Payer: Self-pay

## 2020-04-29 DIAGNOSIS — F419 Anxiety disorder, unspecified: Secondary | ICD-10-CM | POA: Diagnosis not present

## 2020-04-29 DIAGNOSIS — F411 Generalized anxiety disorder: Secondary | ICD-10-CM

## 2020-04-29 DIAGNOSIS — F332 Major depressive disorder, recurrent severe without psychotic features: Secondary | ICD-10-CM

## 2020-04-29 DIAGNOSIS — R4589 Other symptoms and signs involving emotional state: Secondary | ICD-10-CM

## 2020-04-29 DIAGNOSIS — F329 Major depressive disorder, single episode, unspecified: Secondary | ICD-10-CM | POA: Diagnosis not present

## 2020-04-29 DIAGNOSIS — F339 Major depressive disorder, recurrent, unspecified: Secondary | ICD-10-CM | POA: Diagnosis not present

## 2020-04-29 NOTE — Progress Notes (Signed)
Virtual Visit via Video Note  I connected with [name] Cerniglia on 04/21/20 at  9:00 AM EDT by a video enabled telemedicine application and verified that I am speaking with the correct person using two identifiers.   I discussed the limitations of evaluation and management by telemedicine and the availability of in person appointments. The patient expressed understanding and agreed to proceed.  I discussed the assessment and treatment plan with the patient. The patient was provided an opportunity to ask questions and all were answered. The patient agreed with the plan and demonstrated an understanding of the instructions.   The patient was advised to call back or seek an in-person evaluation if the symptoms worsen or if the condition fails to improve as anticipated.  I provided 180 minutes of non-face-to-face time during this encounter.   [name], LCSW   Daily Group Progress Note  Program: IOP  Group Time: 9:00 - 10:30  Participation Level: Active  Behavioral Response: Appropriate  Type of Therapy:  Group Therapy   Topic: Clinician led check-in regarding current stressors and situation, and review of patient completed daily inventory. Clinician utilized active listening and empathetic response and validated patient emotions. Clinician facilitated processing group on pertinent issues.   Summary of Progress: Patient arrived within time allowed and reports that she is feeling "anxious." Patient rates hermood at a6.5on a scale of 1-10 with 10 being great. Pt reports she had a variable weekend. Pt states she had a discussion with her roommates that did not go well and also hosted a gathering that did go well and she enjoyed. Pt reports struggles with ruminating and negative self-talk. Pt is able to process. Patient engaged in discussion. Pt denies current SI/HI/self-harm thoughts.     Group Time: 10:30 - 12:00  Participation Level:  Active  Behavioral Response:  Appropriate  Type of Therapy: Group Therapy   Topic: Cln led discussion on loneliness and the effects it has on [place]. Group viewed TED talk "how to practice emotional first aid" and had discussion on how to "take action when feeling lonely." Group members shared the ways in which loneliness is and has affected them. Group supported one another in coming up with possible solutions for "taking action."   Summary of Progress:  Pt engaged in discussion and states loneliness is an issue for her. Pt is able to process and states using coping skills to manage negative rumination can be one step she can take to help decrease loneliness.     [name], LCSW

## 2020-04-30 ENCOUNTER — Other Ambulatory Visit: Payer: Self-pay

## 2020-04-30 ENCOUNTER — Other Ambulatory Visit (HOSPITAL_COMMUNITY): Payer: BC Managed Care – PPO | Admitting: Licensed Clinical Social Worker

## 2020-04-30 DIAGNOSIS — F339 Major depressive disorder, recurrent, unspecified: Secondary | ICD-10-CM | POA: Diagnosis not present

## 2020-04-30 DIAGNOSIS — F419 Anxiety disorder, unspecified: Secondary | ICD-10-CM | POA: Diagnosis not present

## 2020-04-30 DIAGNOSIS — F332 Major depressive disorder, recurrent severe without psychotic features: Secondary | ICD-10-CM

## 2020-04-30 DIAGNOSIS — F329 Major depressive disorder, single episode, unspecified: Secondary | ICD-10-CM | POA: Diagnosis not present

## 2020-04-30 DIAGNOSIS — F411 Generalized anxiety disorder: Secondary | ICD-10-CM

## 2020-04-30 NOTE — Progress Notes (Signed)
Virtual Visit via Video Note   I connected with Amanda Fowler, who prefers to go by "Amanda Fowler" on 04/30/20 at 9:00 AM EST by a video enabled telemedicine application and verified that I am speaking with the correct person using two identifiers.   Location: Patient: Patient Home Provider: [facility] [place] Office   Case Manager discussed the limitations of evaluation and management by telemedicine and the availability of in person appointments during orientation. The patient expressed understanding and agreed to proceed.   History of Present Illness: MDD   Observations/Objective: Case Manager checked in with all participants to review discharge dates, insurance authorizations, work-related documents and needs for the treatment team. Counselor facilitated a check-in with group members to gauge mood and current functioning as well as identify recent progress towards treatment goals.  Amanda Fowler, who prefers to go by "Amanda Fowler" presented to group session on time and was alert, oriented x5, with no evidence or self-report of current SI/HI or A/V H.  Amanda Fowler. reported scores of 4/10 for both depression and anxiety today.  Amanda Fowler reported that she continues to enjoy her vacation and spent some time visiting the pool and playing putt-putt yesterday until the weather turned stormy.  She disclosed that she had a bad dream last night which brought up some unpleasant memories, and this left her feeling angry today.  Counselor explained concept of 'anger iceberg' to Amanda Fowler regarding how other emotions can be linked to this feeling under the surface, and inquired about how she plans to improve mood today.  Amanda Fowler reported that she also felt guilt, grief, hurt, and pain, and would work towards learning to forgive herself for the past so that she can move on.      Psycho-educational portion of group was provided by pharmacist, Einar Grad. Pharmacist provided psychoeducation on classes of medications such as antidepressants, antipsychotics, what  symptoms they are intended to treat, and any side effects one might encounter while on prescription.  Time was allowed for clients to ask any questions they might have of pharmacist.  Amanda Fowler did not participate in this portion of group.    Counselor introduced topic of stress management today.  Counselor provided definition of stress as feeling tense, overwhelmed, worn out, and/or exhausted, and noted that in small amounts, stress can be motivating until things become too overwhelming to manage.  Counselor also explained how stress can be acute (brief but intense) or chronic (long-lasting) and this can impact the severity of symptoms one can experience in the physical, emotional, and behavioral categories.  Counselor inquired about the types of anxiety/stress that members tend to experience, as well as the impact it has had upon their thoughts, feelings, and behavior.  Counselor introduced several helpful strategies for members to explore to aid in providing stress reduction, such as mindful breathing to stay grounded and clear mind, progressive muscle relaxation to alleviate tension in the body, and guided imagery to visualize a safe space one can temporarily escape to when needed.  Counselor also discussed various cognitive distortions which could lead to feelings of increased stress, such as jumping to conclusions, overgeneralizing, mental filtering, and labeling.  Counselor asked which ones members could relate to, and recommended keeping a thought journal to document these unhelpful thinking styles, and begin to challenge them to interrupt cycle.  Counselor encouraged members to consider discussing stressor 'red flags' with their close supports that can be monitored and strategies for assisting them in times of crisis.  Intervention was effective, as evidenced by Amanda Fowler participating in  discussion on the subject and noting that she experiences stress in the form of panic attacks, social anxiety, OCD traits, and some  PTSD like symptoms.  Amanda Fowler reported that she ruminates on automatic negative thoughts of failure in regard to school/success, and when anxiety is bad, this also worsens her ADD, ultimately leading her to feel fatigued and increase desire to sleep to avoid problems like school assignments.  Amanda Fowler reported that when stressed, physiological symptoms include headaches, breathing in a shallow manner, and feeling nausea.  She also reported that she retains tension in her jaw, shoulders, and neck, and disclosed that her jaw has even locked up in the past when she felt overwhelmed.  Amanda Fowler reported that she has been improving coping skills to address stress in healthier ways, such as practicing relaxation breathing, exploring progressive muscle relaxation, using guided imagery at night to fall asleep more easily and increase rest, as well as make an effort to challenge cognitive distortions such as jumping to conclusions and relying on emotional reasoning.  Amanda Fowler reported that one of her primary goals is to build motivation and confidence in herself so that she can complete school successfully and make her family proud.     Assessment and Plan: Counselor recommends that patient remains in IOP treatment to better manage mental health symptoms and continue to address treatment plan goals. Counselor recommends adherence to crisis/safety plan, taking medications as prescribed and following up with medical professionals if any issues arise.    Follow Up Instructions: Counselor will send Webex link for next session.  The patient was advised to call back or seek an in-person evaluation if the symptoms worsen or if the condition fails to improve as anticipated.   I provided 180 minutes of non-face-to-face time during this encounter.     Noralee Stain, LCSW, LCAS

## 2020-05-01 ENCOUNTER — Other Ambulatory Visit (HOSPITAL_COMMUNITY): Payer: BC Managed Care – PPO | Admitting: Licensed Clinical Social Worker

## 2020-05-01 ENCOUNTER — Other Ambulatory Visit: Payer: Self-pay

## 2020-05-01 DIAGNOSIS — R4589 Other symptoms and signs involving emotional state: Secondary | ICD-10-CM

## 2020-05-01 DIAGNOSIS — F339 Major depressive disorder, recurrent, unspecified: Secondary | ICD-10-CM | POA: Diagnosis not present

## 2020-05-01 DIAGNOSIS — F332 Major depressive disorder, recurrent severe without psychotic features: Secondary | ICD-10-CM

## 2020-05-01 DIAGNOSIS — F329 Major depressive disorder, single episode, unspecified: Secondary | ICD-10-CM | POA: Diagnosis not present

## 2020-05-01 DIAGNOSIS — F411 Generalized anxiety disorder: Secondary | ICD-10-CM

## 2020-05-01 DIAGNOSIS — F419 Anxiety disorder, unspecified: Secondary | ICD-10-CM | POA: Diagnosis not present

## 2020-05-01 NOTE — Progress Notes (Addendum)
Virtual Visit via Video Note  I connected with [name] Cephas on 05/01/20 at  9:00 AM EDT by a video enabled telemedicine application and verified that I am speaking with the correct person using two identifiers.   Case Manager discussed the limitations of evaluation and management by telemedicine and the availability of in person appointments. The patient expressed understanding and agreed to proceed.  History of Present Illness: MDD, GAD  Location:  Patient: Patient Home Provider: [facility]  Observations/Objective: Case Manager checked in with all participants to review discharge dates, insurance authorizations, work-related documents and needs for the treatment team. Clinician facilitated a check-in with group members to assess mood and current functioning. Clinician introduced self and prompted client to provide an update on functioning since last group session. Client checked in by providing an update and discussed a situation that occurred with her father last night in which she felt uncomfortable and angry. Client described the event and her attempts to seek support which led her to feeling invalidated. Client reported that she plans to address this with her father and processed potential benefit of relaying the "feeling under the anger which was fear" rather than just communicating her anger alone. Client described intervention from yesterday's group session which she found helpful. Client did not endorse SI/HI/psychosis during this engagement.  Initial group discussion was provided by [facility] [place]. Chaplain introduced self her role with joining the IOP group 1x weekly. Chaplain provided education on various types of grief such as anticipatory and disenfranchised grief. Chaplain provided information related to stages of grief and allowed space for members to share their personal experiences with grief/loss. Psycho-educational session co-facilitated by  [facility] with yoga as a practice for mindfulness. Clinician led breathing and movement exercise to practice remaining in the moment. Client provided support to another member while processing with the Chaplain as well as identified her personal experiences with grief in mourning her grandmother's death. Client discussed how her grandmother resided [place] which made the death difficult to process because of the distance felt between she and other family members. Client actively engaged in Yoga exercise and relayed that while this was her first experience in engaging in Yoga, she found it relaxing. Client reported that she plans to return home from vacation today and has overall enjoyed time with her family.  Assessment and Plan: Clinician recommends that patient remains in IOP treatment to better manage mental health symptoms and continue to address treatment plan goals. Clinician recommends adherence to crisis/safety plan, taking medications as prescribed, and following up with medical professionals if any issues arise.  Follow Up Instructions: Clinician will send Webex link for next session. The patient was advised to call back or seek an in-person evaluation if the symptoms worsen or if the condition fails to improve as anticipated.   I provided 170 minutes of non-face-to-face time during this encounter.   Francine Graven, LCSW

## 2020-05-01 NOTE — Progress Notes (Signed)
Virtual Visit via Video Note  I connected with Amanda Fowler on 04/29/20 at  9:00 AM EDT by a video enabled telemedicine application and verified that I am speaking with the correct person using two identifiers.   Case Manager discussed the limitations of evaluation and management by telemedicine and the availability of in person appointments. The patient expressed understanding and agreed to proceed.  History of Present Illness: MDD  Location:  Patient: Patient Home Provider: [facility]  Observations/Objective: Clinician facilitated a check-in with group members to assess mood and current functioning. Clinician introduced self and prompted client to provide an update on functioning since last group session. Clinician provided guided meditation centered on reducing stress and encouraged members to notice thoughts wandering and bring them back to the present moment throughout the meditation. Client checked in by providing an update and reported that she has been on vacation with family members for several days. Client relayed that she has experienced ruminating thoughts centered on conflict with her roommates and struggles with deciding on whether she wants to confront them or say nothing at all. Client provided detail into the incident and identified feelings associated with the situation in which she has felt disappointed and angry. Client acknowledged that their response is out of her control and reported that she responded in an appropriate manner however she continues to struggle with feeling "blind sided" by the means in which her roommates informed her that they want her to move out of their apartment. Client received feedback from a peer and was receptive. Client denied any current SI/HI/psychosis.   Psycho-educational portion of group centered on vulnerability. Clinician shared Ted-Talk by Dewain Penning titled 'The Power of Vulnerability' and prompted members to process responses,  thoughts, and feelings on the topic. Clinician further facilitated discussion on the risk vs reward in being vulnerable with our own emotions and other people.  Clinician utilized open ended questions to prompt discussion when needed and appropriate re-direction. Clinician praised client's insight and willingness to engage. Client reported she often finds herself struggling to "be vulnerable with myself" and engaged in discussion on how this impacts her thoughts, feelings, and relationships.   Clinician shared handout titled on Vulnerability which provides psychoeducation and examples of barriers and reward in vulnerability. Clinician prompted members to provide their own examples in which they have chose not to be vulnerable or a time in which they did and share the outcome of that scenario. Clinician proceeded with check-out and assessed for 1 self-care activity prior to the next group session. Client actively engaged in discussion and reported that she has a fear of being shut down by others if she were to take a risk to be vulnerable with them and provided identified conflict with her roommates as an example in which she relayed her feelings and felt that they weren't receptive or empathetic towards her. Client also discussed situations in which she has attempted to seek support from professors by being vulnerable in disclosing difficulties she was facing and some were receptive while others weren't. Client reported that today she plans on "distracting myself" by taking a walk on the beach and looking for sea shells.  Assessment and Plan: Clinician recommends that patient remains in IOP treatment to better manage mental health symptoms and continue to address treatment plan goals. Clinician recommends adherence to crisis/safety plan, taking medications as prescribed, and following up with medical professionals if any issues arise.  Follow Up Instructions: Clinician will send Webex link for next session.  The patient was advised to call back or seek an in-person evaluation if the symptoms worsen or if the condition fails to improve as anticipated.    The patient was advised to call back or seek an in-person evaluation if the symptoms worsen or if the condition fails to improve as anticipated.  I provided 170 minutes of non-face-to-face time during this encounter.   Renee Harder, LCSW

## 2020-05-02 ENCOUNTER — Encounter (HOSPITAL_COMMUNITY): Payer: Self-pay

## 2020-05-02 ENCOUNTER — Other Ambulatory Visit: Payer: Self-pay

## 2020-05-02 ENCOUNTER — Other Ambulatory Visit (HOSPITAL_COMMUNITY): Payer: BC Managed Care – PPO | Admitting: Licensed Clinical Social Worker

## 2020-05-02 DIAGNOSIS — F332 Major depressive disorder, recurrent severe without psychotic features: Secondary | ICD-10-CM

## 2020-05-02 DIAGNOSIS — F411 Generalized anxiety disorder: Secondary | ICD-10-CM

## 2020-05-02 DIAGNOSIS — F329 Major depressive disorder, single episode, unspecified: Secondary | ICD-10-CM | POA: Diagnosis not present

## 2020-05-02 DIAGNOSIS — F419 Anxiety disorder, unspecified: Secondary | ICD-10-CM | POA: Diagnosis not present

## 2020-05-02 DIAGNOSIS — F339 Major depressive disorder, recurrent, unspecified: Secondary | ICD-10-CM | POA: Diagnosis not present

## 2020-05-02 NOTE — Progress Notes (Signed)
Virtual Visit via Video Note   I connected with Amanda Fowler, who prefers to go by "Amanda Fowler" on 05/02/20 at 9:00 AM EST by a video enabled telemedicine application and verified that I am speaking with the correct person using two identifiers.   Location: Patient: Patient Home Provider: [facility]   Case Manager discussed the limitations of evaluation and management by telemedicine and the availability of in person appointments during orientation. The patient expressed understanding and agreed to proceed.   History of Present Illness: MDD   Observations/Objective: Case Manager checked in with all participants to review discharge dates, insurance authorizations, work-related documents and needs for the treatment team. Counselor facilitated a check-in with group members to gauge mood and current functioning as well as identify recent progress towards treatment goals.  Amanda Fowler, who prefers to go by "Amanda Fowler" presented to group session on time and was alert, oriented x5, with no evidence or self-report of current SI/HI or A/V H.  Amanda Fowler reported scores of 2/10 for depression and 5/10 for anxiety today.  Amanda Fowler reported that she was feeling sleepy today after getting back home late from vacation the day before, then finding her boyfriend's apartment too noisy to relax in.  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        Counselor engaged the group in discussion on managing work/life balance today to improve mental health and wellness.  Counselor explained how finding balance between responsibilities at home and work place can be challenging, lead to increased stress, and this has been further complicated by recent pandemic leading to unemployment, more virtual work, and blurring of lines between home as a place of rest or work duties.  Counselor facilitated discussion on what challenges members have faced with this  issue historically, as well as what, if any, issues have arisen following pandemic. Counselor also discussed strategies for improving work/life balance while members work on their mental health during treatment.  Some of these included keeping track of time management; creating a list of priorities and scaling importance; setting realistic, measurable goals each day; establishing boundaries; taking care of health needs; and nurturing relationships at home and work for support.  Counselor inquired about areas where members feel they are excelling, as well as areas they could focus on during treatment. Intervention effectiveness could not be measured, as Amanda Fowler reported that she felt too fatigued to continue with group after break, and left group early to go back to sleep.  She reported that she would plan to attend group as scheduled on Monday.  Counselor informed case Production designer, theatre/television/film of this.       Assessment and Plan: Counselor recommends that patient remains in IOP treatment to better manage mental health symptoms and continue to address treatment plan goals. Counselor recommends adherence to crisis/safety plan, taking medications as prescribed and following up with medical professionals if any issues arise.    Follow Up Instructions: Counselor will send Webex link for next session.  The patient was advised to call back or seek an in-person evaluation if the symptoms worsen or if the condition fails to improve as anticipated.   I provided 120 minutes of non-face-to-face time during this encounter.     Noralee Stain, LCSW, LCAS

## 2020-05-04 NOTE — BH Assessment (Signed)
Q-Actual   Writer attempted to contact the participant in order to complete a PHQ 2-9.  Writer was unsuccessful in reaching the patient.  Writer left a message.

## 2020-05-05 ENCOUNTER — Other Ambulatory Visit (HOSPITAL_COMMUNITY): Payer: BC Managed Care – PPO | Admitting: Licensed Clinical Social Worker

## 2020-05-05 DIAGNOSIS — F419 Anxiety disorder, unspecified: Secondary | ICD-10-CM | POA: Diagnosis not present

## 2020-05-05 DIAGNOSIS — F332 Major depressive disorder, recurrent severe without psychotic features: Secondary | ICD-10-CM

## 2020-05-05 DIAGNOSIS — F411 Generalized anxiety disorder: Secondary | ICD-10-CM

## 2020-05-05 DIAGNOSIS — F329 Major depressive disorder, single episode, unspecified: Secondary | ICD-10-CM | POA: Diagnosis not present

## 2020-05-05 DIAGNOSIS — F339 Major depressive disorder, recurrent, unspecified: Secondary | ICD-10-CM | POA: Diagnosis not present

## 2020-05-05 NOTE — Progress Notes (Signed)
Virtual Visit via Video Note   I connected with Amanda Fowler, who prefers to go by "AK" on 05/05/20 at 9:00 AM EST by a video enabled telemedicine application and verified that I am speaking with the correct person using two identifiers.   Location: Patient: Patient Home Provider: [facility]   Case Manager discussed the limitations of evaluation and management by telemedicine and the availability of in person appointments during orientation. The patient expressed understanding and agreed to proceed.   History of Present Illness: MDD, and GAD    Observations/Objective: Case Manager checked in with all participants to review discharge dates, insurance authorizations, work-related documents and needs for the treatment team. Counselor facilitated a check-in with group members to gauge mood and current functioning as well as identify recent progress towards treatment goals.  Amanda Fowler, who prefers to go by "AK" presented to group session on time and was alert, oriented x5, with no evidence or self-report of SI/HI or A/V H.  AK reported scores of 4/10 for depression and 8/10 for anxiety today.  AK reported that she spent mother's day with her mother treating her to some self-care activities, such as making a margarita, doing her nails, and applying face masks.  AK reported that it was 'really nice' until her mother began to bring up the past and overshare, which led to 'a depression spiral'.  AK reported that she then forgot to take her medicine on Sunday and slept all day, and acknowledged that her mother can be a toxic influence at times, so she still needs to be mindful of the impact her porous boundaries can have on mental health.  AK reported "She definitely leans on me too hard and I need to change that".   AK reported that she is now compliant with medication again as of this morning.    Counselor covered topic of core beliefs with group today.  Counselor provided handout on the  subject, which explained how everyone looks at the world differently, and two people can have the same experience, but have different interpretations of what happened.  Members were encouraged to think of these like sunglasses with different "shades" influencing perception towards positive or negative outcomes.  Examples of negative core beliefs were provided, such as "I'm unlovable", "I'm not good enough", and "I'm a bad person".  Members were asked to identify which one(s) they could relate to, and consider pieces of evidence which contradict this belief.  Counselor also provided psychoeducation on positive affirmations today.  Counselor explained how these are positive statements which can be spoken out loud or recited mentally to challenge negative thoughts and/or core beliefs to improve mood and outlook each day.  Counselor provided a comprehensive list of affirmations to members with different categories, including ones for health, confidence, success, and happiness.  Counselor invited members to look through this list and identify any which resonated with them, and practice saying them out loud with sincerity. Intervention was effective, as evidenced by AK engaging in discussion on the subject and reporting that she has a few core beliefs which negatively impact daily functioning and outlook.  AK reported that one of these is "I'll never be an organized or clean person", as she has struggled from an early age with messiness and has had other people point this out before.  She reported that this has led to feelings of resentment towards herself, and anxiety, as well as feeling like she has too much to do and will never be able  to catch up.  AK reported that an additional core belief is "I'm a failure and/or will never graduate" due to her struggles with school.  She reported that this was reinforced by her brother's success in school and feeling as though she has been living in his shadow lately, which  increased pressure and expectations upon herself beyond a reasonable level.  AK was receptive to positive affirmations and reported that she liked several of them, including "I love myself deeply and completely", "Failure is great feedback", and "I can do whatever I focus my mind upon".    Assessment and Plan: Counselor recommends that patient remains in IOP treatment to better manage mental health symptoms and continue to address treatment plan goals. Counselor recommends adherence to crisis/safety plan, taking medications as prescribed and following up with medical professionals if any issues arise.    Follow Up Instructions: Counselor will send Webex link for next session.  The patient was advised to call back or seek an in-person evaluation if the symptoms worsen or if the condition fails to improve as anticipated.   I provided 180 minutes of non-face-to-face time during this encounter.     Shade Flood, LCSW, LCAS

## 2020-05-06 ENCOUNTER — Other Ambulatory Visit (HOSPITAL_COMMUNITY): Payer: BC Managed Care – PPO

## 2020-05-06 ENCOUNTER — Other Ambulatory Visit: Payer: Self-pay

## 2020-05-07 ENCOUNTER — Encounter (HOSPITAL_COMMUNITY): Payer: Self-pay

## 2020-05-07 ENCOUNTER — Other Ambulatory Visit (HOSPITAL_COMMUNITY): Payer: BC Managed Care – PPO | Admitting: Licensed Clinical Social Worker

## 2020-05-07 ENCOUNTER — Other Ambulatory Visit: Payer: Self-pay

## 2020-05-07 DIAGNOSIS — F419 Anxiety disorder, unspecified: Secondary | ICD-10-CM | POA: Diagnosis not present

## 2020-05-07 DIAGNOSIS — F411 Generalized anxiety disorder: Secondary | ICD-10-CM

## 2020-05-07 DIAGNOSIS — F329 Major depressive disorder, single episode, unspecified: Secondary | ICD-10-CM | POA: Diagnosis not present

## 2020-05-07 DIAGNOSIS — F332 Major depressive disorder, recurrent severe without psychotic features: Secondary | ICD-10-CM

## 2020-05-07 DIAGNOSIS — F339 Major depressive disorder, recurrent, unspecified: Secondary | ICD-10-CM | POA: Diagnosis not present

## 2020-05-07 MED ORDER — PROPRANOLOL HCL 10 MG PO TABS: 10.0000 mg | ORAL_TABLET | Freq: Three times a day (TID) | ORAL | 0 refills | Status: DC

## 2020-05-07 NOTE — Progress Notes (Signed)
Virtual Visit via Video Note   I connected with Amanda Fowler, who prefers to go by "AK" on 05/07/20 at 9:00 AM EST by a video enabled telemedicine application and verified that I am speaking with the correct person using two identifiers.   Location: Patient: Patient Home Provider: [facility]   Case Manager discussed the limitations of evaluation and management by telemedicine and the availability of in person appointments during orientation. The patient expressed understanding and agreed to proceed.   History of Present Illness: MDD   Observations/Objective: Case Manager checked in with all participants to review discharge dates, insurance authorizations, work-related documents and needs for the treatment team. Counselor facilitated a check-in with group members to gauge mood and current functioning as well as identify recent progress towards treatment goals.  Amanda Fowler, who prefers to go by "AK" presented to group session on time and was alert, oriented x5, with no evidence or self-report of current SI/HI or A/V H.  AK. reported scores of 5/10 for depression and 10/10 for anxiety today.  AK reported that yesterday did not go well for her because her car didn't pass inspection, she received a letter informing her that she is now on academic probation, and she needs to get a new bed because hers is currently broken.  AK admitted that this led to high anxiety and 'negative thought patterns' which were difficult to disrupt.  AK reported that she needs to identify more physical grounding approaches to address this while she continues with treatment.       Counselor introduced topic of building social support network today.  Counselor explained how this can be defined as having a having a group of healthy people in one's life you can talk to, spend time with, and get help from to improve both mental and physical health.  Counselor noted that some barriers can make it difficult to connect with other  people, including the presence of anxiety or depression, or moving to an unfamiliar area.  Group members were asked to assess the current state of their support network, and identify ways that this could be improved.  Tips were given on how to address previously noted barriers, such as strengthening social skills, using relaxation techniques to reduce anxiety, scheduling social time each week, and/or exploring social events nearby which could increase chances of meeting new supports.  Members were also encouraged to consider getting closer to people they already know through suggestions such as outreaching someone by text, email or phone call if they haven't spoken in awhile, doing something nice for a friend/family member unexpectedly, and/or inviting someone over for a game/movie/dinner night. Intervention was effective, as evidenced by AK participating in discussion on the topic and reporting that when she is feeling more depressed, she tends to isolate herself from her support network, which leads to weakened connections with those that might provide helpful advice.  AK reported that she has gone out of her way to help others through crisis before, but when she has attempted to open up to some friends, they have pushed back, which makes her worry about rejection, and increase hesitance to be honest at times.  AK reported that some ways she has networked to meet new people include engaging in organizations through school such as an Pensions consultant and a sorority, as well as Architectural technologist, teaching kindergarten, and helping with a soup kitchen.  AK reported that these allowed her to gain new perspective, meet new people, and  opened up new opportunities, so she intends to continue pushing herself to socialize more when she can overcome social anxiety. She also expressed interest in seeking linkage to a religious institution in the area that might reflect her values to further  enhance network.    Assessment and Plan: Counselor recommends that patient remains in IOP treatment to better manage mental health symptoms and continue to address treatment plan goals. Counselor recommends adherence to crisis/safety plan, taking medications as prescribed and following up with medical professionals if any issues arise.    Follow Up Instructions: Counselor will send Webex link for next session.  The patient was advised to call back or seek an in-person evaluation if the symptoms worsen or if the condition fails to improve as anticipated.   I provided 180 minutes of non-face-to-face time during this encounter.     Noralee Stain, LCSW, LCAS

## 2020-05-07 NOTE — Progress Notes (Signed)
Reordered patient's Inderal 10 mg to [facility]. As patient reported " lost medication" over the weekend. Stated she may have left her medication at the beach.

## 2020-05-08 ENCOUNTER — Other Ambulatory Visit (HOSPITAL_COMMUNITY): Payer: BC Managed Care – PPO

## 2020-05-08 ENCOUNTER — Other Ambulatory Visit: Payer: Self-pay

## 2020-05-09 ENCOUNTER — Other Ambulatory Visit (HOSPITAL_COMMUNITY): Payer: BC Managed Care – PPO | Admitting: Licensed Clinical Social Worker

## 2020-05-09 ENCOUNTER — Other Ambulatory Visit: Payer: Self-pay

## 2020-05-09 DIAGNOSIS — F332 Major depressive disorder, recurrent severe without psychotic features: Secondary | ICD-10-CM

## 2020-05-09 DIAGNOSIS — F419 Anxiety disorder, unspecified: Secondary | ICD-10-CM | POA: Diagnosis not present

## 2020-05-09 DIAGNOSIS — F339 Major depressive disorder, recurrent, unspecified: Secondary | ICD-10-CM | POA: Diagnosis not present

## 2020-05-09 DIAGNOSIS — F329 Major depressive disorder, single episode, unspecified: Secondary | ICD-10-CM | POA: Diagnosis not present

## 2020-05-09 DIAGNOSIS — F411 Generalized anxiety disorder: Secondary | ICD-10-CM

## 2020-05-09 NOTE — Progress Notes (Signed)
Virtual Visit via Video Note   I connected with Amanda Fowler, who prefers to go by "Amanda Fowler" on 05/09/20 at 9:00 AM EST by a video enabled telemedicine application and verified that I am speaking with the correct person using two identifiers.   Location: Patient: [facility] Home Provider: [facility]   Case Manager discussed the limitations of evaluation and management by telemedicine and the availability of in person appointments during orientation. The patient expressed understanding and agreed to proceed.   History of Present Illness: MDD   Observations/Objective: Case Manager checked in with all participants to review discharge dates, insurance authorizations, work-related documents and needs for the treatment team. Counselor facilitated a check-in with group members to gauge mood and current functioning as well as identify recent progress towards treatment goals.  Amanda Fowler, who prefers to go by "Amanda Fowler" presented to group session on time and was alert, oriented x5, with no evidence or self-report of current SI/HI or A/V H.  Amanda Fowler reported scores of 3/10 for depression and 5/10 for anxiety today.  Amanda Fowler reported that she was feeling okay this morning and was proud of herself for going out with friends to a baseball game the night before.  Amanda Fowler admitted that she turned down the offer at first, but eventually gave in, and had a good time overall.  Amanda Fowler reported that the downside of this was reaching a point where she was no longer wanting to socialize as the evening went on, which led her to reach a state of dissociation and disconnect from the others.  Amanda Fowler reported that she intends to work on finding a balance so that she can strengthen connections with supports and curb isolation without increasing anxiety.    Counselor introduced topic of creating mental health maintenance plan today.  Counselor provided handout on subject to members, which stressed the importance of maintaining one's mental health in a similar  way to using diet and exercise to ensure physical health.  Counselor walked members through process of identifying triggers which could worsen symptoms, including specific people, places, and things one needs to avoid.  Members were also tasked with identifying warning signs such as thoughts, feelings, or behaviors which could indicate mental health is at increased risk.  Counselor also facilitated conversation on self-care activities and coping strategies which members have previously utilized in the past, are currently using in daily routine, or plan to use soon to assist with managing problems or symptoms when/if they appear.  Counselor encouraged members to revisit their maintenance plan often and make changes as needed to ensure day to day stability.  Interventions were effective, as evidenced by Amanda Fowler participating in activity and reporting that some of her personal triggers to be mindful of include being around her childhood bedroom, doing yoga, being around intoxicated people, and having people say "We need to talk", as it reminds her of breakups.  Amanda Fowler reported that some of her warning signs include becoming unusually quiet or 'shutting down', dissociating, and/or having sleep patterns disrupted.  Amanda Fowler reported that her self-care plan to manage better mental health includes painting, playing video games, writing in a journal, hiking, and cutting back on smoking.  Amanda Fowler reported that she believes she would need to return for treatment if she started "Spending too much time in my head", becoming more irritable, and tearful.  She reported that she hopes to stay accountable by speaking with her boyfriend often about her feelings, as well as other positive people in her support network.  Assessment and Plan: Counselor recommends that patient remains in IOP treatment to better manage mental health symptoms and continue to address treatment plan goals. Counselor recommends adherence to crisis/safety plan, taking  medications as prescribed and following up with medical professionals if any issues arise.    Follow Up Instructions: Counselor will send Webex link for next session.  The patient was advised to call back or seek an in-person evaluation if the symptoms worsen or if the condition fails to improve as anticipated.   I provided 180 minutes of non-face-to-face time during this encounter.     Shade Flood, LCSW, LCAS

## 2020-05-12 ENCOUNTER — Other Ambulatory Visit: Payer: Self-pay

## 2020-05-12 ENCOUNTER — Encounter (HOSPITAL_COMMUNITY): Payer: Self-pay | Admitting: Family

## 2020-05-12 ENCOUNTER — Other Ambulatory Visit (HOSPITAL_COMMUNITY): Payer: BC Managed Care – PPO | Admitting: Psychiatry

## 2020-05-12 DIAGNOSIS — F332 Major depressive disorder, recurrent severe without psychotic features: Secondary | ICD-10-CM

## 2020-05-12 DIAGNOSIS — F339 Major depressive disorder, recurrent, unspecified: Secondary | ICD-10-CM | POA: Diagnosis not present

## 2020-05-12 DIAGNOSIS — F411 Generalized anxiety disorder: Secondary | ICD-10-CM

## 2020-05-12 DIAGNOSIS — F419 Anxiety disorder, unspecified: Secondary | ICD-10-CM | POA: Diagnosis not present

## 2020-05-12 DIAGNOSIS — F329 Major depressive disorder, single episode, unspecified: Secondary | ICD-10-CM | POA: Diagnosis not present

## 2020-05-12 MED ORDER — ARIPIPRAZOLE 2 MG PO TABS: 2.0000 mg | ORAL_TABLET | Freq: Every day | ORAL | 0 refills | Status: DC

## 2020-05-12 MED ORDER — VENLAFAXINE HCL ER 150 MG PO CP24: 150.0000 mg | ORAL_CAPSULE | Freq: Every day | ORAL | 0 refills | Status: DC

## 2020-05-12 MED ORDER — ARIPIPRAZOLE 2 MG PO TABS: 2.0000 mg | ORAL_TABLET | Freq: Every day | ORAL | 1 refills | Status: DC

## 2020-05-12 NOTE — Patient Instructions (Signed)
D:  Patient completed MH-IOP today.  A:  Follow up with Dr. Lolly Mustache on 06-04-20 @ 1pm and Hilbert Odor, LCSW on 05-27-20 @ 2 pm.  Encouraged support groups.  R:  Patient receptive.

## 2020-05-12 NOTE — Progress Notes (Signed)
Virtual Visit via Video Note   I connected with Amanda Fowler, who prefers to go by "AK" on 05/12/20 at 9:00 AM EST by a video enabled telemedicine application and verified that I am speaking with the correct person using two identifiers.   Location: Patient: Patient Home Provider: [facility]   Case Manager discussed the limitations of evaluation and management by telemedicine and the availability of in person appointments during orientation. The patient expressed understanding and agreed to proceed.   History of Present Illness: MDD, and GAD    Observations/Objective: Case Manager checked in with all participants to review discharge dates, insurance authorizations, work-related documents and needs for the treatment team. Counselor facilitated a check-in with group members to gauge mood and current functioning as well as identify recent progress towards treatment goals.  [name], who prefers to go by "AK" presented to group session on time and was alert, oriented x5, with no evidence or self-report of SI/HI or A/V H.  AK reported scores of 5/10 for oth depression and anxiety today.  AK reported that she did not end up having a good weekend and slept through most of it as a result.  AK reported that her problems began Friday when she went out with friends, and did not have a good time, as she experienced several automatic negative thoughts which made her believe that people did not want her around, and then ended up stranded after the night ended.  AK reported that she walked to her boyfriend's house, but could not reach him, and this only made things worse, as she ended up walking home and stated "It really hurt my overall mental state".  AK reported that her plan is to complete IOP today, see a therapist in June to continue with treatment, and speak with her boyfriend about what happened the other day to determine if the relationship should continue.  She was receptive to feedback from  other group members.      Counselor introduced topic of anger management today.  Counselor shared a handout with members on this subject featuring a variety of coping skills, and facilitated discussion on these approaches.  Examples included raising awareness of anger triggers, practicing deep breathing, keeping an anger log to better understand episodes, using diversion activities to distract oneself for 30 minutes, taking a time out when necessary, and being mindful of warning signs tied to thoughts or behavior.  Counselor inquired about which techniques group members have used before, what has proved to be helpful, what their unique warning signs might be, as well as what they will try out in the future to assist with de-escalation.  Intervention was effective, as evidenced by AK engaging in discussion on the subject and reporting that she does not get angry very often, as she has a tendency to retain anger until she can no longer compartmentalize it.  AK reported that she finds herself easily triggered when someone tells her what to do without her input in the matter, or when she feels insecure.  AK reported that one side effect of anger was experiencing lock jaw for 4 months when on a trip in [place].  AK reported that she was in a long distance toxic relationship at the time and eventually her stress grew so much that this physical side effect appeared unexpectedly. She reported that her goal is to become more open and honest with feelings such as anger moving forward so that she will no longer bottle difficult feelings up, and  more willingly use support system.  She reported that attending IOP was particularly helpful and she will miss having members to process challenges with each week.     Assessment and Plan: Counselor recommends that patient remains engaged in treatment with a therapist to continue working towards better management mental health symptoms and treatment plan goals. Counselor recommends  adherence to crisis/safety plan, taking medications as prescribed and following up with medical professionals if any issues arise.    Follow Up Instructions:  The patient was advised to call back or seek an in-person evaluation if the symptoms worsen or if the condition fails to improve as anticipated.   I provided 180 minutes of non-face-to-face time during this encounter.     Shade Flood, LCSW, LCAS

## 2020-05-12 NOTE — Addendum Note (Signed)
Addended by: Oneta Rack on: 05/12/2020 12:49 PM   Modules accepted: Orders

## 2020-05-12 NOTE — Progress Notes (Signed)
Virtual Visit via Video Note  I connected with Amanda Fowler on @TODAY @ at  9:00 AM EDT by a video enabled telemedicine application and verified that I am speaking with the correct person using two identifiers.   I discussed the limitations of evaluation and management by telemedicine and the availability of in person appointments. The patient expressed understanding and agreed to proceed.   I discussed the assessment and treatment plan with the patient. The patient was provided an opportunity to ask questions and all were answered. The patient agreed with the plan and demonstrated an understanding of the instructions.   The patient was advised to call back or seek an in-person evaluation if the symptoms worsen or if the condition fails to improve as anticipated.  I provided 20 minutes of non-face-to-face time during this encounter.   Patient ID: Amanda Fowler, female   DOB: 03-Jan-1996, 24 y.o.   MRN: [id number] As previous [facility] note states: Pt reports to [facility] per inpt referral. Pt was inpt due to SI with plan/intent to overdose or have a car accident. Pt denies other hospitalizations. Pt reports counseling on/off since age 38, no current counselor; pt sees 14 for psychiatry at [facility] since freshman year. 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 Pt reports no suicide attempts, denies current SI/HI/AVH, endorses passive SI thoughts at times. Pt reports school, living with 4 girls (2 are pt's best friends) and "daily taking care of myself is hard."Pt family hx: Dad: anxiety, depression, OCD; Mom: depression, anxiety, ADHD. Patients Currently Reported Symptoms/Problems: Increased depression and anxiety; decreased ADLs (showering, cooking, cleaning); decreased motivation; increased fatigue; decreased concentration; over sleeping (18-20 hours/day prior to inpt stay, abt 12 currently); racing thoughts; feelings of hopelessness/worthlessness/helplessness; passive SI; school problems;  panic attacks  Pt transitioned to MH-IOP from [facility] today.  Denies SI/HI or A/V hallucinations.  On a scale 1-10 (10 being worst), pt rated her anxiety and depression a 6.  Reports PHP was very helpful.  Pt attended all scheduled days.  Reports overall mood is improving although she didn't have a great weekend.  Pt was able to apply her coping skills and stay with her boyfriend for support.  States that the groups were great.  Scored #3 on anxiety scale (1-10 with 10 being the worst); scored #4 on depression scale.  Pt denies SI/HI or A/V hallucination. A: D/C today.  F/U with [facility], LCSW on 05-27-20 @ 2pm and Dr. 09[birth year]03-1971 on 06-04-20 @ 2pm.  Strongly recommended support groups.  R:  Pt receptive.  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

## 2020-05-12 NOTE — Progress Notes (Signed)
  Virtual Visit via Telephone Note  I connected with Amanda Fowler on 05/12/20 at  9:00 AM EDT by telephone and verified that I am speaking with the correct person using two identifiers.   I discussed the limitations, risks, security and privacy concerns of performing an evaluation and management service by telephone and the availability of in person appointments. I also discussed with the patient that there may be a patient responsible charge related to this service. The patient expressed understanding and agreed to proceed.  I discussed the assessment and treatment plan with the patient. The patient was provided an opportunity to ask questions and all were answered. The patient agreed with the plan and demonstrated an understanding of the instructions.   The patient was advised to call back or seek an in-person evaluation if the symptoms worsen or if the condition fails to improve as anticipated.  I provided 15 minutes of non-face-to-face time during this encounter.   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 Presented to hospital on 3/16 voluntarily. She reports worsening depression over recent days and states that on day of admission, "I felt like I was in a real dark place". She attributes depression in part to academic stressors/load. Endorses neuro-vegetative symptoms- hypersomnia, erratic appetite, decreased energy level, anhedonia, decreased sense of concentration. She reports recent suicidal ideations over the day or two prior to her admission, which she describes as " thinking it would be better to die". She reports recent thoughts of overdosing. Denies psychotic  symptoms.In addition to depression she also describes significant anxiety, which she describes mainly as worrying excessively/anxious ruminations   Progress in Program Toward Treatment Goals: Ongoing, patient attended and participated with daily group session with active and engaged participation.  Reports overall her mood has improved since attending and completing partial hospitalization programming and intensive outpatient programming.  Patient does state having " bad weekend mentally" however was able to pull her self through after seeing her boyfriend.  She denies suicidal or homicidal ideations.  Denies auditory or visual hallucinations.  Patient is requesting medication refill at discharge.  Patient to keep all outpatient follow-up appointments.  Progress (rationale): [name] to keep follow-up with Dr. Lolly Mustache 06/04/2020 and Hilbert Odor LCSW 05/27/2020 at 16:00  Take all medications as prescribed. Keep all follow-up appointments as scheduled.  Do not consume alcohol or use illegal drugs while on prescription medications. Report any adverse effects from your medications to your primary care provider promptly.  In the event of recurrent symptoms or worsening symptoms, call 911, a crisis hotline, or go to the nearest emergency department for evaluation.   Amanda Rack, NP 05/12/2020

## 2020-05-13 NOTE — Psych (Signed)
Virtual Visit via Video Note  I connected with Amanda Fowler on 04/04/20 at  9:00 AM EDT by a video enabled telemedicine application and verified that I am speaking with the correct person using two identifiers.   I discussed the limitations of evaluation and management by telemedicine and the availability of in person appointments. The patient expressed understanding and agreed to proceed.  I discussed the assessment and treatment plan with the patient. The patient was provided an opportunity to ask questions and all were answered. The patient agreed with the plan and demonstrated an understanding of the instructions.   The patient was advised to call back or seek an in-person evaluation if the symptoms worsen or if the condition fails to improve as anticipated.  Pt was provided 240 minutes of non-face-to-face time during this encounter.   Quinn Axe, [facility]    [facility] BH PHP THERAPIST PROGRESS NOTE  Idona Stach [id number]  Session Time: 9:00 - 10:00  Participation Level: Active  Behavioral Response: CasualAlertDepressed  Type of Therapy: Group Therapy  Treatment Goals addressed: Coping  Interventions: CBT, DBT, Supportive and Reframing  Summary:  Clinician led check-in regarding current stressors and situation, and review of patient completed daily inventory. Clinician utilized active listening and empathetic response and validated patient emotions. Clinician facilitated processing group on pertinent issues.   Therapist Response:Amanda Gaskins is a 24 y.o. female who presents with depression and anxiety symptoms. Patient arrived within time allowed and reports that she is feeling "very tired." Patient rates her mood at a 4 on a scale of 1-10 with 10 being great. Pt reports an afternoon of self-care with a purebar class and playing video games. Pt reports not sleeping well and struggling to get up this morning. Patient engaged in discussion.            Session Time: 10:00 -11:00   Participation Level: Active   Behavioral Response: CasualAlertDepressed   Type of Therapy: Group Therapy   Treatment Goals addressed: Coping   Interventions: CBT, DBT, Solution Focused, Supportive and Reframing   Summary: Cln introduced topic of triggers. Cln discussed the importance of knowing triggers and what skills to use when triggered or prevent getting worked up.     Therapist Response: Pt engaged in discussion. Pt reports it can be easier to run away from problems than face them enough to identify triggers.              Session Time: 11:00- 12:00   Participation Level: Active   Behavioral Response: CasualAlertDepressed   Type of Therapy: Group Therapy, OT   Treatment Goals addressed: Coping   Interventions: Psychosocial skills training, Supportive   Summary: Occupational Therapy group   Therapist Response: Patient engaged in group. See OT note.            Session Time: 12:00 -1:00   Participation Level: Active   Behavioral Response: CasualAlertDepressed   Type of Therapy: Group therapy   Treatment Goals addressed: Coping   Interventions: CBT; Solution focused; Supportive; Reframing   Summary: 12:00 - 12:50: Clinician introduced topic of "Positive Psychology". Group watched "Positive Psychology" Ted-Talk. Patients discussed how their "lens" of life effects the way they feel. Group discussed 5 strategies to help change lens. Patients identified one strategy they would be willing to try to change their "lens" for at least 21 days to create a new habit. 12:50 -1:00 Clinician led check-out. Clinician assessed for immediate needs, medication compliance and efficacy, and safety concerns   Therapist Response: 12:00 -  12:50: Pt engaged in discussion and reports journaling could be helpful for her to change her lens.  12:50 - 1:00: At check-out, patient rates her mood at an 8 on a scale of 1-10 with 10 being great. Pt states  afternoon plans of spending time outside and completing homework. Patient demonstrates some progress as evidenced by increased efforts to participate in self-care. Patient denies SI/HI/self-harm at the end of group.    Suicidal/Homicidal: Nowithout intent/plan  Plan: Pt will continue in PHP while working to decrease depression and anxiety symptoms, increase daily functioning, and increase ability to manage symptoms as they arise.   Diagnosis: MDD (major depressive disorder), recurrent severe, without psychosis ([place]) [F33.2]    1. MDD (major depressive disorder), recurrent severe, without psychosis ([place])       Royetta Crochet, [facility] 04/04/2020

## 2020-05-21 ENCOUNTER — Encounter (HOSPITAL_COMMUNITY): Payer: Self-pay

## 2020-05-21 DIAGNOSIS — F332 Major depressive disorder, recurrent severe without psychotic features: Secondary | ICD-10-CM

## 2020-05-21 NOTE — BH Assessment (Signed)
Q-actual 2-wk PHQ 2-9   PHQ 2 is 2 PHQ 9 is 8  Participant reports that she keeps forgetting to complete the [employer].  Writer discussed the email and text alters on the app to assit her in rembering.

## 2020-05-27 ENCOUNTER — Other Ambulatory Visit: Payer: Self-pay

## 2020-05-27 ENCOUNTER — Ambulatory Visit (INDEPENDENT_AMBULATORY_CARE_PROVIDER_SITE_OTHER): Payer: BC Managed Care – PPO | Admitting: Psychiatry

## 2020-05-27 DIAGNOSIS — R4589 Other symptoms and signs involving emotional state: Secondary | ICD-10-CM

## 2020-05-27 DIAGNOSIS — F411 Generalized anxiety disorder: Secondary | ICD-10-CM | POA: Diagnosis not present

## 2020-05-27 DIAGNOSIS — F332 Major depressive disorder, recurrent severe without psychotic features: Secondary | ICD-10-CM | POA: Diagnosis not present

## 2020-05-27 NOTE — Progress Notes (Signed)
Virtual Visit via Video Note  I connected with Amanda Fowler on 05/27/20 at  2:00 PM EDT by a video enabled telemedicine application and verified that I am speaking with the correct person using two identifiers.  Location: Patient: Patient Home Provider: Home Office   I discussed the limitations of evaluation and management by telemedicine and the availability of in person appointments. The patient expressed understanding and agreed to proceed.  History of Present Illness: MDD and GAD   Treatment Plan Goals: 1) To stabilize MH in order to make an informed decision about best plan to complete college degree. 2) To take medications as prescribed on a consistent bases, from missing 3-4 days a week to missing only one dose a week. 3) To improve self-care routine to decrease environmental stressors and improve hygiene, as evidenced by eating well, cleaning up room weekly, engaging in enjoyable activities and attending needed appointments.   Observations/Objective: Counselor met with client for individual therapy via Webex. Counselor assessed MH symptoms and worked with client to identify relevent treatment plan goals, with patient reporting that she has been "hanging on by a thread since IOP". Client presents with moderate depression and high anxiety. Client denied self-harm behaviors and reported passive thoughts of suicide by accident, such as car accident and slipping in shower. Client has not acted on thoughts and remains following safety plan developed in IOP.   Client shared that she is experiencing stage of life issues regarding completing schooling, experiencing grief and loss of not graduating on time, being "kicked out" of her housing, anxiety about next steps to take in her education and vocation. Counselor assessed history of these concerns. Client and counselor discussed homework for client to complete to address concerns, including journal, researching options, and connecting  with others in similar circumstances. Client discussed benefits of medication, and having difficulty with taking medications daily. Counselor and client processed barriers to achieving her goal and a plan to set reminders to take medications as prescribed. Using CBT strategies Counselor processed thoughts and feelings causing current behaviors. Counselor and Client plan to discuss the topic of "being a burden" at our next session and to determine a healthy self-care.   Assessment and Plan: Counselor will continue to meet with patient to address treatment plan goals. Patient will continue to follow recommendations of providers and implement skills learned in session.  Follow Up Instructions: Counselor will send information for next session via Webex.    The patient was advised to call back or seek an in-person evaluation if the symptoms worsen or if the condition fails to improve as anticipated.  I provided 55 minutes of non-face-to-face time during this encounter.   Lise Auer, LCSW

## 2020-05-28 ENCOUNTER — Encounter (HOSPITAL_COMMUNITY): Payer: Self-pay | Admitting: Psychiatry

## 2020-05-31 ENCOUNTER — Telehealth (HOSPITAL_COMMUNITY): Payer: BC Managed Care – PPO | Admitting: Psychiatry

## 2020-05-31 ENCOUNTER — Other Ambulatory Visit: Payer: Self-pay

## 2020-06-04 ENCOUNTER — Ambulatory Visit (HOSPITAL_COMMUNITY): Payer: BC Managed Care – PPO | Admitting: Psychiatry

## 2020-06-11 ENCOUNTER — Encounter (HOSPITAL_COMMUNITY): Payer: Self-pay | Admitting: Psychiatry

## 2020-06-11 ENCOUNTER — Other Ambulatory Visit: Payer: Self-pay

## 2020-06-11 ENCOUNTER — Ambulatory Visit (HOSPITAL_COMMUNITY): Payer: BC Managed Care – PPO | Admitting: Psychiatry

## 2020-06-11 DIAGNOSIS — R4589 Other symptoms and signs involving emotional state: Secondary | ICD-10-CM

## 2020-06-11 NOTE — Progress Notes (Signed)
Talked to client over the phone. Was unable to meet at this time. Stated she was "ok". Wanted to reschedule for next week. Will meet again 06/18/20 @3 :30.

## 2020-06-16 ENCOUNTER — Ambulatory Visit (HOSPITAL_COMMUNITY): Payer: BC Managed Care – PPO | Admitting: Licensed Clinical Social Worker

## 2020-06-18 ENCOUNTER — Ambulatory Visit (INDEPENDENT_AMBULATORY_CARE_PROVIDER_SITE_OTHER): Payer: BC Managed Care – PPO | Admitting: Psychiatry

## 2020-06-18 ENCOUNTER — Other Ambulatory Visit: Payer: Self-pay

## 2020-06-18 ENCOUNTER — Encounter (HOSPITAL_COMMUNITY): Payer: Self-pay | Admitting: Psychiatry

## 2020-06-18 DIAGNOSIS — F332 Major depressive disorder, recurrent severe without psychotic features: Secondary | ICD-10-CM | POA: Diagnosis not present

## 2020-06-18 NOTE — Progress Notes (Signed)
Virtual Visit via Video Note  I connected with Amanda Fowler on 06/18/20 at  3:30 PM EDT by a video enabled telemedicine application and verified that I am speaking with the correct person using two identifiers.  Location: Patient: Patient Home Provider: Home Office   I discussed the limitations of evaluation and management by telemedicine and the availability of in person appointments. The patient expressed understanding and agreed to proceed.  History of Present Illness: MDD and GAD   Treatment Plan Goals: 1) To stabilize MH in order to make an informed decision about best plan to complete college degree. 2) To take medications as prescribed on a consistent bases, from missing 3-4 days a week to missing only one dose a week. 3) To improve self-care routine to decrease environmental stressors and improve hygiene, as evidenced by eating well, cleaning up room weekly, engaging in enjoyable activities and attending needed appointments.   Observations/Objective: Counselor met with Client for individual therapy via Webex. Counselor assessed MH symptoms and progress on treatment plan goals, with patient reported incresed anxiety about upcoming transitions. Client concerned about negative side effects to [facility] medications. In need of med eval. Counselor to communicate with provider to [place] care. Client presents with moderate depression and moderate anxiety. Client denied suicidal ideation or self-harm behaviors.   Client shared that she was currently spending time with friends, finding her time relaxing and enjoyable. In processing, client discussed discord and tension in relationships due to upcoming move out of their housing arrangement. Counselor provided CBT interventions to process thoughts, feelings and behaviors associated with event/triggers. Counselor assessed daily functioning, with client noting that she is regressing in ability to upkeep room, hygiene, expectations of others.  Client discussed issues with medication side effects and missed appointments with provider due to lack of focus, concentrations and structure in her life. Counselor to communicate needs with provider and reestablish care.   Counselor and Client spent time identifying healthy coping strategies to apply to combat depression and anxiety. Counselor to send information on "Adulting with Mental Illness". Client noted that she will be moving within same city in less than 2 weeks, then will be on a 2 week vacation out of the country, would like to follow up before trip for stabilization.   Assessment and Plan: Counselor will continue to meet with patient to address treatment plan goals. Patient will continue to follow recommendations of providers and implement skills learned in session.  Follow Up Instructions: Counselor will send information for next session via Webex.    The patient was advised to call back or seek an in-person evaluation if the symptoms worsen or if the condition fails to improve as anticipated.  I provided 50 minutes of non-face-to-face time during this encounter.   Lise Auer, LCSW

## 2020-06-23 ENCOUNTER — Encounter (HOSPITAL_COMMUNITY): Payer: Self-pay

## 2020-06-23 DIAGNOSIS — F332 Major depressive disorder, recurrent severe without psychotic features: Secondary | ICD-10-CM

## 2020-06-23 NOTE — BH Assessment (Signed)
Q-Actual   Writer attempted to contact the participant in order to complete a PHQ 2-9.  Writer was unsuccessful in reaching the patient and left a voice mail message.     

## 2020-06-24 ENCOUNTER — Other Ambulatory Visit: Payer: Self-pay

## 2020-06-24 ENCOUNTER — Encounter (HOSPITAL_COMMUNITY): Payer: Self-pay | Admitting: Psychiatry

## 2020-06-24 ENCOUNTER — Telehealth (INDEPENDENT_AMBULATORY_CARE_PROVIDER_SITE_OTHER): Payer: BC Managed Care – PPO | Admitting: Psychiatry

## 2020-06-24 VITALS — Wt 184.0 lb

## 2020-06-24 DIAGNOSIS — F330 Major depressive disorder, recurrent, mild: Secondary | ICD-10-CM | POA: Diagnosis not present

## 2020-06-24 DIAGNOSIS — F428 Other obsessive-compulsive disorder: Secondary | ICD-10-CM | POA: Diagnosis not present

## 2020-06-24 DIAGNOSIS — F410 Panic disorder [episodic paroxysmal anxiety] without agoraphobia: Secondary | ICD-10-CM

## 2020-06-24 DIAGNOSIS — F900 Attention-deficit hyperactivity disorder, predominantly inattentive type: Secondary | ICD-10-CM

## 2020-06-24 MED ORDER — PROPRANOLOL HCL 10 MG PO TABS: 10.0000 mg | ORAL_TABLET | Freq: Two times a day (BID) | ORAL | 0 refills | Status: AC

## 2020-06-24 MED ORDER — ARIPIPRAZOLE 2 MG PO TABS: 2.0000 mg | ORAL_TABLET | Freq: Every day | ORAL | 0 refills | Status: AC

## 2020-06-24 MED ORDER — VENLAFAXINE HCL ER 150 MG PO CP24: 150.0000 mg | ORAL_CAPSULE | Freq: Every day | ORAL | 0 refills | Status: DC
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
## 2020-06-28 DIAGNOSIS — Z20822 Contact with and (suspected) exposure to covid-19: Secondary | ICD-10-CM | POA: Diagnosis not present

## 2020-07-08 ENCOUNTER — Encounter (HOSPITAL_COMMUNITY): Payer: Self-pay | Admitting: Psychiatry

## 2020-07-08 ENCOUNTER — Other Ambulatory Visit: Payer: Self-pay

## 2020-07-08 ENCOUNTER — Ambulatory Visit (INDEPENDENT_AMBULATORY_CARE_PROVIDER_SITE_OTHER): Payer: BC Managed Care – PPO | Admitting: Psychiatry

## 2020-07-08 DIAGNOSIS — R4589 Other symptoms and signs involving emotional state: Secondary | ICD-10-CM | POA: Diagnosis not present

## 2020-07-08 DIAGNOSIS — F330 Major depressive disorder, recurrent, mild: Secondary | ICD-10-CM | POA: Diagnosis not present

## 2020-07-08 NOTE — Progress Notes (Signed)
Virtual Visit via Video Note  I connected with Amanda Fowler on 07/08/20 at  3:30 PM EDT by a video enabled telemedicine application and verified that I am speaking with the correct person using two identifiers.  Location: Patient: Patient Home Provider: Home Office   I discussed the limitations of evaluation and management by telemedicine and the availability of in person appointments. The patient expressed understanding and agreed to proceed.  History of Present Illness: MDD and Difficulty Coping   Treatment Plan Goals: 1) To stabilize MH in order to make an informed decision about best plan to complete college degree. 2) To take medications as prescribed on a consistent bases, from missing 3-4 days a week to missing only one dose a week. 3) To improve self-care routine to decrease environmental stressors and improve hygiene, as evidenced by eating well, cleaning up room weekly, engaging in enjoyable activities and attending needed appointments.  Observations/Objective: Counselor met with Client for individual therapy via Webex. Counselor assessed MH symptoms and progress on treatment plan goals, with patient reporting that she is currently traveling with family which is causing depressive symptoms, such as fatigue, lack of interest, lack of motivation, teariness. Client unsure if it is medication related or due to environmental factors. Client presents with moderate depression and moderate anxiety. Client denied suicidal ideation or self-harm behaviors.   Client shared that since our last session, she returned from trip with friends, moved into a new apartment and is now on a trip with her mother. Counselor assessed emotional impact of increased transitions, with client noting increased anxiety, with worry, "brain fog", grief and loss responses, over stimulation, and the above depressive symptoms. Counselor used CBT interventions to address symptom presentation. Client engaged well  in intervention and psychoeducation. Counselor and Client used solution-focused strategies to create a plan for a more successful experience for the remainder of her family trip, to better manage anxiety and depressive symptoms. Client noted feeling more equipped and confident in going into the following days and upcoming activities. Client to share results at follow up apt.   Assessment and Plan: Counselor will continue to meet with patient to address treatment plan goals. Patient will continue to follow recommendations of providers and implement skills learned in session.  Follow Up Instructions: Counselor will send information for next session via Webex.    The patient was advised to call back or seek an in-person evaluation if the symptoms worsen or if the condition fails to improve as anticipated.  I provided 55 minutes of non-face-to-face time during this encounter.   Lise Auer, LCSW

## 2020-07-28 ENCOUNTER — Other Ambulatory Visit: Payer: Self-pay

## 2020-07-28 ENCOUNTER — Encounter (HOSPITAL_COMMUNITY): Payer: Self-pay | Admitting: Psychiatry

## 2020-07-28 ENCOUNTER — Telehealth (INDEPENDENT_AMBULATORY_CARE_PROVIDER_SITE_OTHER): Payer: BC Managed Care – PPO | Admitting: Psychiatry

## 2020-07-28 VITALS — Wt 175.0 lb

## 2020-07-28 DIAGNOSIS — F900 Attention-deficit hyperactivity disorder, predominantly inattentive type: Secondary | ICD-10-CM | POA: Diagnosis not present

## 2020-07-28 DIAGNOSIS — F410 Panic disorder [episodic paroxysmal anxiety] without agoraphobia: Secondary | ICD-10-CM

## 2020-07-28 DIAGNOSIS — F330 Major depressive disorder, recurrent, mild: Secondary | ICD-10-CM

## 2020-07-28 DIAGNOSIS — F428 Other obsessive-compulsive disorder: Secondary | ICD-10-CM

## 2020-07-28 MED ORDER — VENLAFAXINE HCL ER 37.5 MG PO CP24: ORAL_CAPSULE | ORAL | 0 refills | Status: DC
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
## 2020-08-14 ENCOUNTER — Other Ambulatory Visit: Payer: Self-pay

## 2020-08-14 ENCOUNTER — Ambulatory Visit (INDEPENDENT_AMBULATORY_CARE_PROVIDER_SITE_OTHER): Payer: BC Managed Care – PPO | Admitting: Psychiatry

## 2020-08-14 ENCOUNTER — Encounter (HOSPITAL_COMMUNITY): Payer: Self-pay | Admitting: Psychiatry

## 2020-08-14 DIAGNOSIS — F330 Major depressive disorder, recurrent, mild: Secondary | ICD-10-CM | POA: Diagnosis not present

## 2020-08-14 NOTE — Progress Notes (Signed)
Virtual Visit via Video Note  I connected with Amanda Fowler on 08/14/20 at  3:30 PM EDT by a video enabled telemedicine application and verified that I am speaking with the correct person using two identifiers.  Location: Patient: Patient Home Provider: Home Office   I discussed the limitations of evaluation and management by telemedicine and the availability of in person appointments. The patient expressed understanding and agreed to proceed.  History of Present Illness: MDD and GAD  Treatment Plan Goals: 1) To stabilize MH in order to make an informed decision about best plan to complete college degree. 2) To take medications as prescribed on a consistent bases, from missing 3-4 days a week to missing only one dose a week. 3) To improve self-care routine to decrease environmental stressors and improve hygiene, as evidenced by eating well, cleaning up room weekly, engaging in enjoyable activities and attending needed appointments.  Observations/Objective: Counselor met with Client for individual therapy via Webex. Counselor assessed MH symptoms and progress on treatment plan goals, with patient reporting that she has made a decision to take this semester off to work on completing an incomplete course, recover from several life transitions and to work an additional job. Counselor praised the client for ability to think through her decision and her confidence in the decision made. Counselor assessed goal 2, with client stating that she has become more compliant with medication regimen, however, she did not feel comfortable moving forward with current psychiatrist due to interactions at the last appointment. Counselor directed client to discuss with her provider and with admin staff to resolve issue. Client reports being more aware and in tune with how her medications are working and impacting her functioning. In relation to goal 3, Counselor assessed and processed efforts towards  self-care. Client reports moving out of former living situation, where relationships were toxic and unhealthy. Client is now living with her boyfriend, reporting feeling more like herself, calm, peaceful, not having to walk on eggshells. Clients notes that she is working on setting up and organizing apartment to prevent unclean habits. Client discussed argument between her and her boyfriend regarding getting a pet. Counselor shared psychodynamics about communication and boundary setting in relationships.  Client presents with mild depression and moderate anxiety. Client denied suicidal ideation or self-harm behaviors. Counselor prompted client to share how coping skills were applied to assist in bettering her vacation at last session, with client sharing positive reports and healthy behavioral outcomes.   Assessment and Plan: Counselor will continue to meet with patient to address treatment plan goals. Patient will continue to follow recommendations of providers and implement skills learned in session.  Follow Up Instructions: Counselor will send information for next session via Webex.    The patient was advised to call back or seek an in-person evaluation if the symptoms worsen or if the condition fails to improve as anticipated.  I provided 55 minutes of non-face-to-face time during this encounter.   Lise Auer, LCSW

## 2020-08-25 ENCOUNTER — Other Ambulatory Visit: Payer: Self-pay

## 2020-08-25 ENCOUNTER — Ambulatory Visit (HOSPITAL_COMMUNITY): Payer: BC Managed Care – PPO | Admitting: Psychiatry

## 2020-09-02 ENCOUNTER — Other Ambulatory Visit: Payer: Self-pay

## 2020-09-02 ENCOUNTER — Telehealth (HOSPITAL_COMMUNITY): Payer: BC Managed Care – PPO | Admitting: Psychiatry

## 2020-09-04 ENCOUNTER — Ambulatory Visit (INDEPENDENT_AMBULATORY_CARE_PROVIDER_SITE_OTHER): Payer: BC Managed Care – PPO | Admitting: Psychiatry

## 2020-09-04 ENCOUNTER — Encounter (HOSPITAL_COMMUNITY): Payer: Self-pay | Admitting: Psychiatry

## 2020-09-04 ENCOUNTER — Other Ambulatory Visit: Payer: Self-pay

## 2020-09-04 DIAGNOSIS — F411 Generalized anxiety disorder: Secondary | ICD-10-CM | POA: Diagnosis not present

## 2020-09-04 NOTE — Progress Notes (Signed)
Virtual Visit via Video Note  I connected with [name] Kehl on 09/04/20 at  3:30 PM EDT by a video enabled telemedicine application and verified that I am speaking with the correct person using two identifiers.  Location: Patient: Patient Home Provider: Home Office   I discussed the limitations of evaluation and management by telemedicine and the availability of in person appointments. The patient expressed understanding and agreed to proceed.  History of Present Illness: MDD and GAD  Treatment Plan Goals: 1) To stabilize MH in order to make an informed decision about best plan to complete college degree. 2) To take medications as prescribed on a consistent bases, from missing 3-4 days a week to missing only one dose a week. 3) To improve self-care routine to decrease environmental stressors and improve hygiene, as evidenced by eating well, cleaning up room weekly, engaging in enjoyable activities and attending needed appointments.  Observations/Objective: Counselor met with Client for individual therapy via Webex. Counselor assessed MH symptoms and progress on treatment plan goals, with patient reporting feeling more stable, calm and focused on improving overall wellness. Client reports having feelings of laziness, depression and fatigue. Client presents with moderate depression and moderate anxiety. Client denied suicidal ideation or self-harm behaviors.   Counselor assessed progress on Goal 2, with Client noting that she is taking medications as prescribed on a daily basis. Client reports sleep issues and concerns, waking to take medications in the morning, then quickly falling back asleep, waking for the day around noon. Counselor recommends client communicate needs with provider. Client in search of new provider, due to lack of connection with current provider. Counselor discussed communicating concerns directly with provider to determine need for transition of care.    Counselor prompted Client to share about efforts to addressing Goal 3. Client shared a variety of positive reports including, joining a recreational volleyball team, purging closets and home-selling items online successfully, engaging in care of new cat, and setting boundaries with family to not become too overwhelmed with obligation. Counselor praised the client for efforts and progress in this area. Counselor and Client spent remainder of time identifying ways she can increase her energy through light activity/exercise, engaging in art projects and reading a good book. Client and  Counselor worked to set up future appointments. Client to engage in group treatment next week for more accountability and skill building.  Counselor   Assessment and Plan: Counselor will continue to meet with patient to address treatment plan goals. Patient will continue to follow recommendations of providers and implement skills learned in session.  Follow Up Instructions: Counselor will send information for next session via Webex.    The patient was advised to call back or seek an in-person evaluation if the symptoms worsen or if the condition fails to improve as anticipated.  I provided 55 minutes of non-face-to-face time during this encounter.   Lise Auer, LCSW

## 2020-09-09 ENCOUNTER — Ambulatory Visit (HOSPITAL_COMMUNITY): Payer: BC Managed Care – PPO | Admitting: Psychiatry

## 2020-09-16 ENCOUNTER — Encounter (HOSPITAL_COMMUNITY): Payer: Self-pay | Admitting: Psychiatry

## 2020-09-16 ENCOUNTER — Ambulatory Visit (INDEPENDENT_AMBULATORY_CARE_PROVIDER_SITE_OTHER): Payer: BC Managed Care – PPO | Admitting: Psychiatry

## 2020-09-16 ENCOUNTER — Other Ambulatory Visit: Payer: Self-pay

## 2020-09-16 DIAGNOSIS — F411 Generalized anxiety disorder: Secondary | ICD-10-CM

## 2020-09-16 DIAGNOSIS — F330 Major depressive disorder, recurrent, mild: Secondary | ICD-10-CM | POA: Diagnosis not present

## 2020-09-16 NOTE — Progress Notes (Signed)
Virtual Visit via Video Note  I connected with Amanda Fowler on 09/16/20 at 12:00 PM EDT by a video enabled telemedicine application and verified that I am speaking with the correct person using two identifiers.  Location: Patient: Patient Home Provider: Home Office   GROUP GOAL: Client will attend IOP Aftercare Group Therapy 2-4x a month to connect with peers and to apply strategies discussed to improve mental health condition.  History of Present Illness: GAD  Observations/Objective: Counselor met with Patient in the context of Group Therapy, via Webex. Counselor prompted Patient to share updates on coping skill application and individual therapeutic process in management of mental health. Client noted that she has been focusing energy on purging house/items, watching youtube for organizational skills Counselor prompted Patient to share areas of concern, challenges and identify barriers to meeting current goals. Client shared about making efforts to be more social, but causing difficulties with social interactions. Topics covered: structuring day for healthy habits, connecting with natural supports, medication fears/management and feelings of lonliness. Patient engaged in discussion, provided feedback for others within the group and took note of additional strategies reviewed and discussed.   Assessment and Plan: Counselor recommends client continue following treatment plan goals, following crisis plan and following up with behavioral health and medical providers as needed. Patient is welcome to join group at next session.   Follow Up Instructions: Counselor to provide link for next session via Webex platform. The patient was advised to call back or seek an in-person evaluation if the symptoms worsen or if the condition fails to improve as anticipated.  I provided 60 minutes of non-face-to-face time during this encounter.   Lise Auer, LCSW

## 2020-09-23 ENCOUNTER — Ambulatory Visit (INDEPENDENT_AMBULATORY_CARE_PROVIDER_SITE_OTHER): Payer: BC Managed Care – PPO | Admitting: Psychiatry

## 2020-09-23 ENCOUNTER — Other Ambulatory Visit: Payer: Self-pay

## 2020-09-23 DIAGNOSIS — F330 Major depressive disorder, recurrent, mild: Secondary | ICD-10-CM | POA: Diagnosis not present

## 2020-09-23 DIAGNOSIS — F411 Generalized anxiety disorder: Secondary | ICD-10-CM

## 2020-09-23 NOTE — Progress Notes (Signed)
Virtual Visit via Video Note  I connected with [name] Slagel on 09/23/20 at 12:00 PM EDT by a video enabled telemedicine application and verified that I am speaking with the correct person using two identifiers.  Location: Patient: Patient Home Provider: Home Office   GROUP GOAL: Client will attend IOP Aftercare Group Therapy 2-4x a month to connect with peers and to apply strategies discussed to improve mental health condition.  History of Present Illness: GAD and MDD  Observations/Objective: Counselor met with Patient in the context of Group Therapy, via Webex. Counselor prompted Patient to share updates on coping skill application and individual therapeutic process in management of mental health. Client offered a variety of coping skills and strategies that have been working for her and reported improved mood and functioning on the whole. Counselor prompted Patient to share areas of concern, challenges and identify barriers to meeting current goals. Client discussed body image and self-esteem issues that have been impacted by a 50 lb weight gain over the past 5 years. She specifically was triggered by a beach trip and shopping with her mother. Client reported increased confidence from receiving the encouraging feedback from group members. Topics covered: self-image/esteem, maintaining healthy habits, medical conditions and mental health, intense feelings with relationship dynamics, and coping strategies, . Patient engaged in discussion, provided feedback for others within the group and took note of additional strategies reviewed and discussed.   Assessment and Plan: Counselor recommends client continue following treatment plan goals, following crisis plan and following up with behavioral health and medical providers as needed. Patient is welcome to join group at next session.   Follow Up Instructions: Counselor to provide link for next session via Webex platform. The patient was  advised to call back or seek an in-person evaluation if the symptoms worsen or if the condition fails to improve as anticipated.  I provided 70 minutes of non-face-to-face time during this encounter.   Lise Auer, LCSW

## 2020-09-24 ENCOUNTER — Encounter (HOSPITAL_COMMUNITY): Payer: Self-pay | Admitting: Psychiatry

## 2020-09-30 ENCOUNTER — Other Ambulatory Visit: Payer: Self-pay

## 2020-09-30 ENCOUNTER — Ambulatory Visit (HOSPITAL_COMMUNITY): Payer: BC Managed Care – PPO | Admitting: Psychiatry

## 2020-10-02 ENCOUNTER — Encounter (HOSPITAL_COMMUNITY): Payer: Self-pay | Admitting: Psychiatry

## 2020-10-02 ENCOUNTER — Ambulatory Visit (INDEPENDENT_AMBULATORY_CARE_PROVIDER_SITE_OTHER): Payer: BC Managed Care – PPO | Admitting: Psychiatry

## 2020-10-02 ENCOUNTER — Other Ambulatory Visit: Payer: Self-pay

## 2020-10-02 DIAGNOSIS — F330 Major depressive disorder, recurrent, mild: Secondary | ICD-10-CM | POA: Diagnosis not present

## 2020-10-02 DIAGNOSIS — F411 Generalized anxiety disorder: Secondary | ICD-10-CM | POA: Diagnosis not present

## 2020-10-02 NOTE — Progress Notes (Signed)
Virtual Visit via Video Note  I connected with Amanda Fowler on 10/02/20 at  1:30 PM EDT by a video enabled telemedicine application and verified that I am speaking with the correct person using two identifiers.  Location: Patient: Patient Home Provider: Home Office   I discussed the limitations of evaluation and management by telemedicine and the availability of in person appointments. The patient expressed understanding and agreed to proceed.  History of Present Illness: MDD and GAD  Treatment Plan Goals: 1) To stabilize MH in order to make an informed decision about best plan to complete college degree. 2) To take medications as prescribed on a consistent bases, from missing 3-4 days a week to missing only one dose a week. 3) To improve self-care routine to decrease environmental stressors and improve hygiene, as evidenced by eating well, cleaning up room weekly, engaging in enjoyable activities and attending needed appointments.   Observations/Objective: Counselor met with Client for individual therapy via Webex. Counselor assessed MH symptoms and progress on treatment plan goals, with patient reporting they they are having sever side effects from reduction plan from effexxor, including severe drowsiness, heaviness of body, issues with driving, forgetfulness, inability to preform everyday tasks. Client presents with moderate depression and moderate anxiety. Client denied suicidal ideation or self-harm behaviors.   Counselor assessed daily functioning and goal 2 of taking medications as prescribed. Client noted that she is sleeping approximately 20 hours a day from first day that medication is reduced until 4-5 days afterwards. Counselor inquired if client is taking medications as prescribed, with Client noting that she is reducing Effexxor 37.5 mg every 2 weeks by doctors orders. Client is concerned that she will run out of medications before she is able to transfer to a new  clinician. Client notes having concerns about reliability with current provider at last two appointments. Client notes other medications are the same and SO wakes her to take them as prescribed. Client plans to contact practice today to ensure no lapse in treatment occurs before time for next refill. Client anticipates another 2-3 months before completely weaned off medication.  In addressing Goal 3, Client reports difficulty with keeping up with daily care routine, due to amount of sleep occurring at this time. Client states the mind feels fine, but body is exhausted and drowsy. Counselor assessed role of support system in care, with Client noting that she feels heard, understood and supported by her mother, SO and boss. Client is hopeful, that the side effects are only temporary. Counselor reviewed safety plan and encouraged safety and monitoring while decreasing medication.  Client to call provider to discuss medication needs in more detail.   Assessment and Plan: Counselor will continue to meet with patient to address treatment plan goals. Patient will continue to follow recommendations of providers and implement skills learned in session.  Follow Up Instructions: Counselor will send information for next session via Webex.    The patient was advised to call back or seek an in-person evaluation if the symptoms worsen or if the condition fails to improve as anticipated.  I provided 35 minutes of non-face-to-face time during this encounter.   Lise Auer, LCSW

## 2020-10-07 ENCOUNTER — Ambulatory Visit (HOSPITAL_COMMUNITY): Payer: BC Managed Care – PPO | Admitting: Psychiatry

## 2020-10-07 ENCOUNTER — Other Ambulatory Visit: Payer: Self-pay

## 2020-10-07 ENCOUNTER — Encounter (HOSPITAL_COMMUNITY): Payer: Self-pay | Admitting: Psychiatry

## 2020-10-07 DIAGNOSIS — F411 Generalized anxiety disorder: Secondary | ICD-10-CM

## 2020-10-07 NOTE — Progress Notes (Signed)
Client accepted invitation to group at the start of group, but was unable to participate due to technical difficulties. Client states that she has had challenges over the past few weeks on waning off medication, causing her to be extremely drowsy. Counselor to follow up with her about availability to participate in the following group therapy session.   Hilbert Odor, LCSW

## 2020-10-16 ENCOUNTER — Encounter (HOSPITAL_COMMUNITY): Payer: Self-pay | Admitting: Psychiatry

## 2020-10-16 ENCOUNTER — Other Ambulatory Visit: Payer: Self-pay

## 2020-10-16 ENCOUNTER — Ambulatory Visit (INDEPENDENT_AMBULATORY_CARE_PROVIDER_SITE_OTHER): Payer: BC Managed Care – PPO | Admitting: Psychiatry

## 2020-10-16 DIAGNOSIS — F411 Generalized anxiety disorder: Secondary | ICD-10-CM

## 2020-10-16 DIAGNOSIS — F330 Major depressive disorder, recurrent, mild: Secondary | ICD-10-CM

## 2020-10-16 NOTE — Progress Notes (Addendum)
Virtual Visit via Video Note  I connected with Amanda Fowler on 10/16/20 at  1:30 PM EDT by a video enabled telemedicine application and verified that I am speaking with the correct person using two identifiers.  Location: Patient: Patient Home Provider: Home Office  I discussed the limitations of evaluation and management by telemedicine and the availability of in person appointments. The patient expressed understanding and agreed to proceed.  History of Present Illness: MDD and GAD  Treatment Plan Goals: 1) To stabilize MH in order to make an informed decision about best plan to complete college degree. 2) To take medications as prescribed on a consistent bases, from missing 3-4 days a week to missing only one dose a week. 3) To improve self-care routine to decrease environmental stressors and improve hygiene, as evidenced by eating well, cleaning up room weekly, engaging in enjoyable activities and attending needed appointments.   Observations/Objective: Counselor met with Client for individual therapy via Webex. Counselor assessed MH symptoms and progress on treatment plan goals, with patient reporting that they have stabilized on medications from tapering down. Client presents with moderate depression and moderate anxiety. Client denied suicidal ideation or self-harm behaviors.   Goal 1: Counselor used MI interventions to assess progress on Client's perspective on MH stabilization and motivation for completing her degree or pursuing work within her field of interest. Client engaged in processing and reports challenges towards finishing degree. Client working towards accepting failing grade if unable to complete pending course work successfully.   Goal 2: Client notes that she is tired and worn out from changes and adjusting to medication dosage. Counselor confirmed contact with provider to extend step down plan. Client to reach out to triage nurse to process current needs.  Client taking medications as prescribed daily.  Goal 3: Counselor assessed daily functioning, with Client reporting improvement in personal functioning, however she discussed extensively the challenges within her partnership and the need for more direct and clear conversation around household responsibilities and appropriate responses to communications. Counselor used CBT techniques to process forward actions with Client, role playing communication strategies.   Assessment and Plan: Counselor will continue to meet with patient to address treatment plan goals. Patient will continue to follow recommendations of providers and implement skills learned in session.  Follow Up Instructions: Counselor will send information for next session via Webex.   The patient was advised to call back or seek an in-person evaluation if the symptoms worsen or if the condition fails to improve as anticipated.  I provided 55 minutes of non-face-to-face time during this encounter.   Lise Auer, LCSW

## 2020-10-17 ENCOUNTER — Telehealth (HOSPITAL_COMMUNITY): Payer: Self-pay | Admitting: Psychiatry

## 2020-10-17 NOTE — Telephone Encounter (Signed)
I called patient and left a message.  As I received a message from her therapist the patient is weaning herself from venlafaxine.  I recommend patient to contact us.  We do not have any more appointment at this time.

## 2020-10-21 ENCOUNTER — Ambulatory Visit (HOSPITAL_COMMUNITY): Payer: BC Managed Care – PPO | Admitting: Psychiatry

## 2020-10-21 ENCOUNTER — Other Ambulatory Visit: Payer: Self-pay

## 2020-10-21 ENCOUNTER — Encounter (HOSPITAL_COMMUNITY): Payer: Self-pay | Admitting: Psychiatry

## 2020-10-21 ENCOUNTER — Telehealth (HOSPITAL_COMMUNITY): Payer: Self-pay | Admitting: *Deleted

## 2020-10-21 DIAGNOSIS — F330 Major depressive disorder, recurrent, mild: Secondary | ICD-10-CM

## 2020-10-21 DIAGNOSIS — F428 Other obsessive-compulsive disorder: Secondary | ICD-10-CM

## 2020-10-21 DIAGNOSIS — F411 Generalized anxiety disorder: Secondary | ICD-10-CM

## 2020-10-21 MED ORDER — VENLAFAXINE HCL ER 37.5 MG PO CP24: ORAL_CAPSULE | ORAL | 0 refills | Status: AC

## 2020-10-21 NOTE — Telephone Encounter (Signed)
I called and left a message to her last week.  She is supposed to stop the Effexor after titrating but looks like she is still taking it.  I can provide Effexor 37.5 mg 1 pill a day for 30 days until she can find a new psychiatrist in 30 days.  If she cannot find a psychiatrist in 30 days then she needs appointment for future refills.  I will call her [facility] [place] at [address] [facility] and she can pick up today.

## 2020-10-21 NOTE — Progress Notes (Signed)
Virtual Visit via Video Note  I connected with Amanda Fowler on 10/21/20 at 12:00 PM EDT by a video enabled telemedicine application and verified that I am speaking with the correct person using two identifiers.  Location: Patient: Patient Home Provider: Home Office   GROUP GOAL: Client will attend IOP Aftercare Group Therapy 2-4x a month to connect with peers and to apply strategies discussed to improve mental health condition.  History of Present Illness: GAD  Observations/Objective: Counselor met with Patient in the context of Group Therapy, via Webex. Counselor prompted Patient to share updates on coping skill application and individual therapeutic process in management of mental health. Client reports that she is assertively communicating needs and has gotten adjusted on new medication regimen. Counselor prompted Patient to share areas of concern, challenges and identify barriers to meeting current goals. Client expressed frustrations with partner and requested feedback from group on how to address issues with him and set healthier boundaries. Topics covered: lack of support, MH misunderstood by others, communicating wants/needs/expectations, grief and loss, and managing bad days/major depression. Patient engaged in discussion, provided feedback for others within the group and took note of additional strategies reviewed and discussed.   Assessment and Plan: Counselor recommends client continue following treatment plan goals, following crisis plan and following up with behavioral health and medical providers as needed. Patient is welcome to join group at next session.   Follow Up Instructions: Counselor to provide link for next session via Webex platform. The patient was advised to call back or seek an in-person evaluation if the symptoms worsen or if the condition fails to improve as anticipated.  I provided 80 minutes of non-face-to-face time during this encounter.   Lise Auer, LCSW

## 2020-10-21 NOTE — Telephone Encounter (Signed)
Pt called twice asking for refill of the Effexor, which is being titrated currently. Pt was a no show last appointment and was informed that she needed to make an appointment, then pt called back and stated that she does not need appointment as she is "cancelling my account with Dr. Lolly Mustache" and just needed a Rx until she finds a new psychiatrist. Please review and advise.

## 2020-10-28 ENCOUNTER — Encounter (HOSPITAL_COMMUNITY): Payer: Self-pay | Admitting: Psychiatry

## 2020-10-28 ENCOUNTER — Ambulatory Visit (INDEPENDENT_AMBULATORY_CARE_PROVIDER_SITE_OTHER): Payer: BC Managed Care – PPO | Admitting: Psychiatry

## 2020-10-28 DIAGNOSIS — F330 Major depressive disorder, recurrent, mild: Secondary | ICD-10-CM | POA: Diagnosis not present

## 2020-10-28 NOTE — Progress Notes (Signed)
Virtual Visit via Video Note  I connected with Amanda Fowler on 10/28/20 at 12:00 PM EDT by a video enabled telemedicine application and verified that I am speaking with the correct person using two identifiers.  Location: Patient: Patient Home Provider: Home Office   GROUP GOAL: Client will attend IOP Aftercare Group Therapy 2-4x a month to connect with peers and to apply strategies discussed to improve mental health condition.  History of Present Illness: GAD  Observations/Objective: Counselor met with Patient in the context of Group Therapy, via Webex. Counselor prompted Patient to share updates on coping skill application and individual therapeutic process in management of mental health. Client shared positive reports with last phase of getting off effexor. Client communicated needs with partner and is monitoring for actual change and understanding. Counselor prompted Patient to share areas of concern, challenges and identify barriers to meeting current goals. Client reports ongoing feelings of loneliness and need for deeper friendships in life. Group discussed creating a meet up group for additional support and connection. Topics covered: relationship challenges, grief and loss, trauma triggers, healthy expression of anger, and application of coping skills. Patient engaged in discussion, provided feedback for others within the group and took note of additional strategies reviewed and discussed.   Assessment and Plan: Counselor recommends client continue following treatment plan goals, following crisis plan and following up with behavioral health and medical providers as needed. Patient is welcome to join group at next session.   Follow Up Instructions: Counselor to provide link for next session via Webex platform. The patient was advised to call back or seek an in-person evaluation if the symptoms worsen or if the condition fails to improve as anticipated.  I provided 75 minutes  of non-face-to-face time during this encounter.   Lise Auer, LCSW

## 2020-11-04 ENCOUNTER — Encounter (HOSPITAL_COMMUNITY): Payer: Self-pay | Admitting: Psychiatry

## 2020-11-04 ENCOUNTER — Other Ambulatory Visit: Payer: Self-pay

## 2020-11-04 ENCOUNTER — Ambulatory Visit (INDEPENDENT_AMBULATORY_CARE_PROVIDER_SITE_OTHER): Payer: BC Managed Care – PPO | Admitting: Psychiatry

## 2020-11-04 DIAGNOSIS — F330 Major depressive disorder, recurrent, mild: Secondary | ICD-10-CM

## 2020-11-04 NOTE — Progress Notes (Signed)
Virtual Visit via Video Note  I connected with Amanda Fowler on 11/04/20 at 12:00 PM EST by a video enabled telemedicine application and verified that I am speaking with the correct person using two identifiers.  Location: Patient: Patient Home Provider: Home Office   GROUP GOAL: Client will attend IOP Aftercare Group Therapy 2-4x a month to connect with peers and to apply strategies discussed to improve mental health condition.  History of Present Illness: MDD  Observations/Objective: Counselor met with Patient in the context of Group Therapy, via Webex. Counselor prompted Patient to share updates on coping skill application and individual therapeutic process in management of mental health. Client reports positive outcomes from addressing household responsibilities with significant other. Counselor prompted Patient to share areas of concern, challenges and identify barriers to meeting current goals. Client expressed disappointment in self due to miscalculation of exam due date which resulted in a zero impacting her overall GPA. Client attempting to be positive and examine the evidence, vs "beat self up" over mistake. Topics covered: disenfranchised grief, mindfulness practices to address addictions, grief and loss, anger, healing from broken relationships. Patient engaged in discussion, provided feedback for others within the group and took note of additional strategies reviewed and discussed.   Assessment and Plan: Counselor recommends client continue following treatment plan goals, following crisis plan and following up with behavioral health and medical providers as needed. Patient is welcome to join group at next session.   Follow Up Instructions: Counselor to provide link for next session via Webex platform. The patient was advised to call back or seek an in-person evaluation if the symptoms worsen or if the condition fails to improve as anticipated.  I provided 70 minutes of  non-face-to-face time during this encounter.   Lise Auer, LCSW

## 2020-11-11 ENCOUNTER — Encounter (HOSPITAL_COMMUNITY): Payer: Self-pay | Admitting: Psychiatry

## 2020-11-11 ENCOUNTER — Ambulatory Visit (INDEPENDENT_AMBULATORY_CARE_PROVIDER_SITE_OTHER): Payer: BC Managed Care – PPO | Admitting: Psychiatry

## 2020-11-11 DIAGNOSIS — F330 Major depressive disorder, recurrent, mild: Secondary | ICD-10-CM | POA: Diagnosis not present

## 2020-11-11 NOTE — Progress Notes (Signed)
Virtual Visit via Video Note  I connected with Amanda Fowler on 11/11/20 at 12:00 PM EST by a video enabled telemedicine application and verified that I am speaking with the correct person using two identifiers.  Location: Patient: Patient Home Provider: Home Office   GROUP GOAL: Client will attend IOP Aftercare Group Therapy 2-4x a month to connect with peers and to apply strategies discussed to improve mental health condition.  History of Present Illness: MDD  Observations/Objective: Counselor met with Patient in the context of Group Therapy, via Webex. Counselor prompted Patient to share updates on coping skill application and individual therapeutic process in management of mental health. Client stated that she engaged in self-care practices to combat depressive symptoms. Counselor prompted Patient to share areas of concern, challenges and identify barriers to meeting current goals. Client engaged in discussion around coping strategies to address her loneliness, comparing self to others, and a sense of failure. Client able to identify a variety of ways to have a more realistic and positive outlook on situation-focusing on what is working. Topics covered: engaging with support system in hard times, relationship dynamics, coping with grief, feelings of loneliness, and other's misunderstanding mental health. Patient engaged in discussion, provided feedback for others within the group and took note of additional strategies reviewed and discussed.   Assessment and Plan: Counselor recommends client continue following treatment plan goals, following crisis plan and following up with behavioral health and medical providers as needed. Patient is welcome to join group at next session.   Follow Up Instructions: Counselor to provide link for next session via Webex platform. The patient was advised to call back or seek an in-person evaluation if the symptoms worsen or if the condition fails to  improve as anticipated.  I provided 80 minutes of non-face-to-face time during this encounter.   Lise Auer, LCSW

## 2020-11-18 ENCOUNTER — Ambulatory Visit (INDEPENDENT_AMBULATORY_CARE_PROVIDER_SITE_OTHER): Payer: BC Managed Care – PPO | Admitting: Psychiatry

## 2020-11-18 ENCOUNTER — Other Ambulatory Visit: Payer: Self-pay

## 2020-11-18 ENCOUNTER — Encounter (HOSPITAL_COMMUNITY): Payer: Self-pay | Admitting: Psychiatry

## 2020-11-18 DIAGNOSIS — F330 Major depressive disorder, recurrent, mild: Secondary | ICD-10-CM

## 2020-11-18 NOTE — Progress Notes (Addendum)
Virtual Visit via Video Note  I connected with [name] Printy on 11/18/20 at 12:00 PM EST by a video enabled telemedicine application and verified that I am speaking with the correct person using two identifiers.  Location: Patient: Patient Home Provider: Home Office   GROUP GOAL: Client will attend IOP Aftercare Group Therapy 2-4x a month to connect with peers and to apply strategies discussed to improve mental health condition.  History of Present Illness: MDD  Observations/Objective: Counselor met with Patient in the context of Group Therapy, via Webex. Counselor prompted Patient to share updates on coping skill application and individual therapeutic process in management of mental health. Client able to share helpful insights with other client's about potential coping strategies to implement that have helped her in th past and currently.  Counselor prompted Patient to share areas of concern, challenges and identify barriers to meeting current goals. Client stated that she is experiencing severe anxiety related to living situation and current partnership. Client stated that she is internalizing generational patterns and problems she is aware are influencing her current functioning. Client to process more in individual therapy on CBT strategies to consider and apply. Topics covered: engaging with support system in hard times, relationship dynamics, coping with grief, feelings of loneliness, and other's misunderstanding mental health. Patient engaged in discussion, provided feedback for others within the group and took note of additional strategies reviewed and discussed.   Assessment and Plan: Counselor recommends client continue following treatment plan goals, following crisis plan and following up with behavioral health and medical providers as needed. Patient is welcome to join group at next session.   Follow Up Instructions: Counselor to provide link for next session via Webex  platform. The patient was advised to call back or seek an in-person evaluation if the symptoms worsen or if the condition fails to improve as anticipated.  I provided 70 minutes of non-face-to-face time during this encounter.   Lise Auer, LCSW

## 2020-11-19 DIAGNOSIS — F3341 Major depressive disorder, recurrent, in partial remission: Secondary | ICD-10-CM | POA: Diagnosis not present

## 2020-11-19 DIAGNOSIS — F411 Generalized anxiety disorder: Secondary | ICD-10-CM | POA: Diagnosis not present

## 2020-11-25 ENCOUNTER — Other Ambulatory Visit: Payer: Self-pay

## 2020-11-25 ENCOUNTER — Encounter (HOSPITAL_COMMUNITY): Payer: Self-pay | Admitting: Psychiatry

## 2020-11-25 ENCOUNTER — Ambulatory Visit (INDEPENDENT_AMBULATORY_CARE_PROVIDER_SITE_OTHER): Payer: BC Managed Care – PPO | Admitting: Psychiatry

## 2020-11-25 DIAGNOSIS — F330 Major depressive disorder, recurrent, mild: Secondary | ICD-10-CM

## 2020-11-25 NOTE — Progress Notes (Signed)
Virtual Visit via Video Note  I connected with Amanda Fowler on 11/25/20 at 12:00 PM EST by a video enabled telemedicine application and verified that I am speaking with the correct person using two identifiers.  Location: Patient: Patient Home Provider: Home Office   GROUP GOAL: Client will attend IOP Aftercare Group Therapy 2-4x a month to connect with peers and to apply strategies discussed to improve mental health condition.  History of Present Illness: MDD  Observations/Objective: Counselor met with Patient in the context of Group Therapy, via Webex. Counselor prompted Patient to share updates on coping skill application and individual therapeutic process in management of mental health. Client reports challenging self to engage with family over the holidays because of benefits from closeness, despite not feeling well psychically and emotionally.  Counselor prompted Patient to share areas of concern, challenges and identify barriers to meeting current goals. Client reports having severe withdraws from medications that were unable to be filled correctly by her dr/pharmacy over the holiday. Client reports being irritable, feeling shocks in her body and extremely tired. Client empowered self to contact provider to address issues and concerns. Topics covered: work related issues due to mental health, grief and loss, depression, anxiety, communication skills, medication side effects and anger issues. Patient engaged in discussion, provided feedback for others within the group and took note of additional strategies reviewed and discussed.   Assessment and Plan: Counselor recommends client continue following treatment plan goals, following crisis plan and following up with behavioral health and medical providers as needed. Patient is welcome to join group at next session.   Follow Up Instructions: Counselor to provide link for next session via Webex platform. The patient was advised to call  back or seek an in-person evaluation if the symptoms worsen or if the condition fails to improve as anticipated.  I provided 65 minutes of non-face-to-face time during this encounter.   Lise Auer, LCSW

## 2020-11-26 ENCOUNTER — Other Ambulatory Visit: Payer: Self-pay

## 2020-11-26 ENCOUNTER — Ambulatory Visit (INDEPENDENT_AMBULATORY_CARE_PROVIDER_SITE_OTHER): Payer: BC Managed Care – PPO | Admitting: Psychiatry

## 2020-11-26 DIAGNOSIS — F330 Major depressive disorder, recurrent, mild: Secondary | ICD-10-CM | POA: Diagnosis not present

## 2020-11-27 NOTE — Progress Notes (Signed)
Virtual Visit via Video Note  I connected with Amanda Fowler on 11/27/20 at  1:30 PM EST by a video enabled telemedicine application and verified that I am speaking with the correct person using two identifiers.  Location: Patient: Patient Home Provider: Home Office  I discussed the limitations of evaluation and management by telemedicine and the availability of in person appointments. The patient expressed understanding and agreed to proceed.  History of Present Illness: MDD and GAD  Treatment Plan Goals: 1) To stabilize MH in order to make an informed decision about best plan to complete college degree. 2) To take medications as prescribed on a consistent bases, from missing 3-4 days a week to missing only one dose a week. 3) To improve self-care routine to decrease environmental stressors and improve hygiene, as evidenced by eating well, cleaning up room weekly, engaging in enjoyable activities and attending needed appointments.   Observations/Objective: Counselor met with Client for individual therapy via Webex. Counselor assessed MH symptoms and progress on treatment plan goals, with patient reporting she is currently dealing with withdraws from medications that were not filled and getting back into her medication regimen/sleep schedule. Client reports feeling exhausted, irritable and depressed due to issues. Client presents withmoderatedepression and moderateanxiety. Client denied suicidal ideation or self-harm behaviors.   Goal 1: Counselor used MI interventions to assess progress on Client's perspective on MH stabilization and motivation for completing her degree or pursuing work within her field of interest. Client engaged in processing and reports challenges towards finishing degree. Client reports registering for classes and communicating needs effectively with school advisor. Client concerned about identity issues with being an older student and attending classes  without her former classmates. Counselor used CBT techniques to challenge thought patterns and to rework/reframe statements. Client and Counselor role played new thoughts.  Goal 2: Counselor praised client for working to advocate for medications to be filled and accessible. Client feeling defeated with ongoing issues with medication and lack of stabilization, making it difficult to function daily in a successful and predictable way. Counselor recommended Client follow up with providers to discuss concerns.  Goal 3: Counselor assessed daily functioning, with Client reporting decline due to missed medications. Client noted sleeping most of the day and night and irritability/agitation when awake. Client plans to address issues with providers.   Assessment and Plan: Counselor will continue to meet with patient to address treatment plan goals. Patient will continue to follow recommendations of providers and implement skills learned in session.  Follow Up Instructions: Counselor will send information for next session via Webex.   The patient was advised to call back or seek an in-person evaluation if the symptoms worsen or if the condition fails to improve as anticipated.  I provided55 minutes of non-face-to-face time during this encounter.   Amanda Auer, LCSW

## 2020-12-02 ENCOUNTER — Encounter (HOSPITAL_COMMUNITY): Payer: Self-pay | Admitting: Psychiatry

## 2020-12-02 ENCOUNTER — Other Ambulatory Visit: Payer: Self-pay

## 2020-12-02 ENCOUNTER — Ambulatory Visit (INDEPENDENT_AMBULATORY_CARE_PROVIDER_SITE_OTHER): Payer: BC Managed Care – PPO | Admitting: Psychiatry

## 2020-12-02 DIAGNOSIS — F330 Major depressive disorder, recurrent, mild: Secondary | ICD-10-CM

## 2020-12-02 NOTE — Progress Notes (Signed)
Virtual Visit via Video Note  I connected with Amanda Fowler on 12/02/20 at 12:00 PM EST by a video enabled telemedicine application and verified that I am speaking with the correct person using two identifiers.  Location: Patient: Patient Home Provider: Home Office   GROUP GOAL: Client will attend IOP Aftercare Group Therapy 2-4x a month to connect with peers and to apply strategies discussed to improve mental health condition.  History of Present Illness: MDD  Observations/Objective: Counselor met with Patient in the context of Group Therapy, via Webex. Counselor prompted Patient to share updates on coping skill application and individual therapeutic process in management of mental health. Client reports that she was able to enjoy her birthday yesterday, practicing mindfulness and act of being present while with family and friends.  Counselor prompted Patient to share areas of concern, challenges and identify barriers to meeting current goals. Client reported that she was disappointed and mad at herself for oversleeping and missing work today. Client expressed feelings of failure. Group members encouraged her to have grace with herself and to focus on what she is doing right, taking mental health one day at a time. Client had more healthy outlook after processing. Topics covered: positive self talk, self-forgiveness, transitions, seeking professional and specialized treatments. Patient engaged in discussion, provided feedback for others within the group and took note of additional strategies reviewed and discussed.   Assessment and Plan: Counselor recommends client continue following treatment plan goals, following crisis plan and following up with behavioral health and medical providers as needed. Patient is welcome to join group at next session.   Follow Up Instructions: Counselor to provide link for next session via Webex platform. The patient was advised to call back or seek an  in-person evaluation if the symptoms worsen or if the condition fails to improve as anticipated.  I provided 65 minutes of non-face-to-face time during this encounter.   Lise Auer, LCSW

## 2020-12-09 ENCOUNTER — Ambulatory Visit (INDEPENDENT_AMBULATORY_CARE_PROVIDER_SITE_OTHER): Payer: BC Managed Care – PPO | Admitting: Psychiatry

## 2020-12-09 DIAGNOSIS — F330 Major depressive disorder, recurrent, mild: Secondary | ICD-10-CM

## 2020-12-10 ENCOUNTER — Encounter (HOSPITAL_COMMUNITY): Payer: Self-pay | Admitting: Psychiatry

## 2020-12-10 ENCOUNTER — Ambulatory Visit (INDEPENDENT_AMBULATORY_CARE_PROVIDER_SITE_OTHER): Payer: BC Managed Care – PPO | Admitting: Psychiatry

## 2020-12-10 ENCOUNTER — Other Ambulatory Visit: Payer: Self-pay

## 2020-12-10 DIAGNOSIS — F330 Major depressive disorder, recurrent, mild: Secondary | ICD-10-CM

## 2020-12-10 NOTE — Progress Notes (Signed)
Virtual Visit via Video Note  I connected with Amanda Fowler on 12/10/20 at  1:30 PM EST by a video enabled telemedicine application and verified that I am speaking with the correct person using two identifiers.  Location: Patient: Patient Home Provider: Home Office  I discussed the limitations of evaluation and management by telemedicine and the availability of in person appointments. The patient expressed understanding and agreed to proceed.  History of Present Illness: MDD and GAD  Treatment Plan Goals: 1) To stabilize MH in order to make an informed decision about best plan to complete college degree. 2) To take medications as prescribed on a consistent bases, from missing 3-4 days a week to missing only one dose a week. 3) To improve self-care routine to decrease environmental stressors and improve hygiene, as evidenced by eating well, cleaning up room weekly, engaging in enjoyable activities and attending needed appointments.   Observations/Objective: Counselor met with Client for individual therapy via Webex. Counselor assessed MH symptoms and progress on treatment plan goals, with patient reportingthat she work up minutes before the session, due to sleep issues. Client identified that she is having troubling dreams. Client shared about the content and experience of the dreams to identify themes or patterns. Counselor applied CBT skills in processing impact of thoughts and feelings associated to dreaming. Client presents withmoderatedepression and moderateanxiety. Client denied suicidal ideation or self-harm behaviors.   Goal 1: Counselor used MI interventions to assess progress on Client's perspective on MH stabilization and motivation for completing her degree or pursuing work within her field of interest. Client identified a trauma experience that has been impacting her development, decision making, mood, and perspective on life. Counselor used TFCBT interventions  to allow and assist Client in processing thoughts and feelings associated with childhood trauma. Counselor provided Client with psychoeducation on the impacts of trauma and on developmental tasks and stages. Client reported therapeutic experience as relieving and healing, feeling supported and heard. Counselor encouraged Client to journal about thoughts and feelings related to trauma and to determine how she would like to move forward with addressing specific issues with family members. Client to report back at next session.  Assessment and Plan: Counselor will continue to meet with patient to address treatment plan goals. Patient will continue to follow recommendations of providers and implement skills learned in session.  Follow Up Instructions: Counselor will send information for next session via Webex.   The patient was advised to call back or seek an in-person evaluation if the symptoms worsen or if the condition fails to improve as anticipated.  I provided4mnutes of non-face-to-face time during this encounter.   BLise Auer LCSW

## 2020-12-10 NOTE — Progress Notes (Signed)
Virtual Visit via Video Note  I connected with Amanda Fowler on 12/09/20 at 12:00 PM EST by a video enabled telemedicine application and verified that I am speaking with the correct person using two identifiers.  Location: Patient: Patient Home Provider: Home Office   GROUP GOAL: Client will attend IOP Aftercare Group Therapy 2-4x a month to connect with peers and to apply strategies discussed to improve mental health condition.  History of Present Illness: MDD  Observations/Objective: Counselor met with Patient in the context of Group Therapy, via Webex. Counselor prompted Patient to share updates on coping skill application and individual therapeutic process in management of mental health. Client discussed an incident where she was proud of how she set and kept a boundary with a family member. Counselor prompted Patient to share areas of concern, challenges and identify barriers to meeting current goals. Client stated that she only woke up a few minutes before group and is having negative/depressive thoughts and feelings about her body, her functioning and status in life. Client opened up briefly about a childhood trauma that continues to impact her overall mental health. Client to process more in individual therapy at next session. Topics covered: holiday blues/triggers, grief and loss, navigating parent - adult child relationships, childhood traumas and life transitions. Patient engaged in discussion, provided feedback for others within the group and took note of additional strategies reviewed and discussed.   Assessment and Plan: Counselor recommends client continue following treatment plan goals, following crisis plan and following up with behavioral health and medical providers as needed. Patient is welcome to join group at next session.   Follow Up Instructions: Counselor to provide link for next session via Webex platform. The patient was advised to call back or seek an  in-person evaluation if the symptoms worsen or if the condition fails to improve as anticipated.  I provided 65 minutes of non-face-to-face time during this encounter.   Lise Auer, LCSW

## 2020-12-16 ENCOUNTER — Encounter (HOSPITAL_COMMUNITY): Payer: Self-pay | Admitting: Psychiatry

## 2020-12-16 ENCOUNTER — Ambulatory Visit (INDEPENDENT_AMBULATORY_CARE_PROVIDER_SITE_OTHER): Payer: BC Managed Care – PPO | Admitting: Psychiatry

## 2020-12-16 DIAGNOSIS — F330 Major depressive disorder, recurrent, mild: Secondary | ICD-10-CM

## 2020-12-16 NOTE — Progress Notes (Signed)
Virtual Visit via Video Note  I connected with Amanda Fowler on 12/16/20 at 12:00 PM EST by a video enabled telemedicine application and verified that I am speaking with the correct person using two identifiers.  Location: Patient: Patient Home Provider: Home Office   GROUP GOAL: Client will attend IOP Aftercare Group Therapy 2-4x a month to connect with peers and to apply strategies discussed to improve mental health condition.  History of Present Illness: MDD  Observations/Objective: Counselor met with Patient in the context of Group Therapy, via Webex. Counselor prompted Patient to share updates on coping skill application and individual therapeutic process in management of mental health. Client reports that her daily functioning and medication stabilization has improved over the past week. Client reports being able to attend a couple social functions without major issues during or after. Counselor prompted Patient to share areas of concern, challenges and identify barriers to meeting current goals. Client discussed emotional confusion in a situation pertaining to partner. Client requested feedback from the group on ways to manage emotions and logically make sound decisions on how to address issue. Topics covered: coping skills, communication skills, couples counseling, and stage of life transitions/stressors. Patient engaged in discussion, provided feedback for others within the group and took note of additional strategies reviewed and discussed.   Assessment and Plan: Counselor recommends client continue following treatment plan goals, following crisis plan and following up with behavioral health and medical providers as needed. Patient is welcome to join group at next session.   Follow Up Instructions: Counselor to provide link for next session via Webex platform. The patient was advised to call back or seek an in-person evaluation if the symptoms worsen or if the condition fails  to improve as anticipated.  I provided 90 minutes of non-face-to-face time during this encounter.   Lise Auer, LCSW

## 2020-12-23 ENCOUNTER — Encounter (HOSPITAL_COMMUNITY): Payer: Self-pay | Admitting: Psychiatry

## 2020-12-23 ENCOUNTER — Ambulatory Visit (INDEPENDENT_AMBULATORY_CARE_PROVIDER_SITE_OTHER): Payer: BC Managed Care – PPO | Admitting: Psychiatry

## 2020-12-23 DIAGNOSIS — F330 Major depressive disorder, recurrent, mild: Secondary | ICD-10-CM

## 2020-12-23 NOTE — Progress Notes (Signed)
Virtual Visit via Video Note  I connected with Amanda Fowler on 12/23/20 at 12:00 PM EST by a video enabled telemedicine application and verified that I am speaking with the correct person using two identifiers.  I discussed the limitations of evaluation and management by telemedicine and the availability of in person appointments. The patient expressed understanding and agreed to proceed.    Location: Patient: Patient Home Provider: Home Office   GROUP GOAL: Client will attend IOP Aftercare Group Therapy 2-4x a month to connect with peers and to apply strategies discussed to improve mental health condition.  History of Present Illness: MDD  Observations/Objective: Counselor met with Patient in the context of Group Therapy, via Webex. Counselor prompted Patient to share updates on coping skill application and individual therapeutic process in management of mental health. Counselor prompted Patient to share areas of concern, challenges and identify barriers to meeting current goals. Topics covered: psychiatry and medication management, family dynamics, communication skills and management of sobriety. Patient engaged in discussion, provided feedback for others within the group and took note of additional strategies reviewed and discussed.   Assessment and Plan: Counselor recommends client continue following treatment plan goals, following crisis plan and following up with behavioral health and medical providers as needed. Patient is welcome to join group at next session.   Follow Up Instructions: Counselor to provide link for next session via Webex platform. The patient was advised to call back or seek an in-person evaluation if the symptoms worsen or if the condition fails to improve as anticipated.  I provided 70 minutes of non-face-to-face time during this encounter.   Lise Auer, LCSW

## 2020-12-24 ENCOUNTER — Ambulatory Visit (INDEPENDENT_AMBULATORY_CARE_PROVIDER_SITE_OTHER): Payer: BC Managed Care – PPO | Admitting: Psychiatry

## 2020-12-24 ENCOUNTER — Encounter (HOSPITAL_COMMUNITY): Payer: Self-pay | Admitting: Psychiatry

## 2020-12-24 ENCOUNTER — Other Ambulatory Visit: Payer: Self-pay

## 2020-12-24 DIAGNOSIS — F330 Major depressive disorder, recurrent, mild: Secondary | ICD-10-CM | POA: Diagnosis not present

## 2020-12-24 NOTE — Progress Notes (Signed)
Virtual Visit via Video Note  I connected with Amanda Fowler on 12/24/20 at  2:30 PM EST by a video enabled telemedicine application and verified that I am speaking with the correct person using two identifiers.  Location: Patient: Patient Home Provider: Home Office  I discussed the limitations of evaluation and management by telemedicine and the availability of in person appointments. The patient expressed understanding and agreed to proceed.  History of Present Illness: MDD and GAD  Treatment Plan Goals: 1) To stabilize MH in order to make an informed decision about best plan to complete college degree. 2) To take medications as prescribed on a consistent bases, from missing 3-4 days a week to missing only one dose a week. 3) To improve self-care routine to decrease environmental stressors and improve hygiene, as evidenced by eating well, cleaning up room weekly, engaging in enjoyable activities and attending needed appointments.   Observations/Objective: Counselor met with Client for individual therapy via Webex. Counselor assessed MH symptoms and progress on treatment plan goals, with patient reportingshe is currently dealing with withdraws from medications that were not filled and getting back into her medication regimen/sleep schedule. Client reports feeling exhausted, irritable and depressed due to issues.Client presents withmoderatedepression and moderateanxiety. Client denied suicidal ideation or self-harm behaviors.   Goal 1: Counselor used MI interventions to assess progress on Client's perspective on MH stabilization and motivation for completing her degree or pursuing work within her field of interest. Client engaged in processing and reports challenges towards finishing degree. Client reports registering for classes and communicating needs effectively with school advisor. Client concerned about identity issues with being an older student and attending classes  without her former classmates. Counselor used CBT techniques to challenge thought patterns and to rework/reframe statements. Client and Counselor role played new thoughts.  Goal 2: Counselor praised client for working to advocate for medications to be filled and accessible. Client feeling defeated with ongoing issues with medication and lack of stabilization, making it difficult to function daily in a successful and predictable way. Counselor recommended Client follow up with providers to discuss concerns.  Goal 3: Counselor assessed daily functioning, with Client reporting decline due to missed medications. Client noted sleeping most of the day and night and irritability/agitation when awake. Client plans to address issues with providers.   Assessment and Plan: Counselor will continue to meet with patient to address treatment plan goals. Patient will continue to follow recommendations of providers and implement skills learned in session.  Follow Up Instructions: Counselor will send information for next session via Webex.   The patient was advised to call back or seek an in-person evaluation if the symptoms worsen or if the condition fails to improve as anticipated.  I provided70mnutes of non-face-to-face time during this encounter.   BLise Auer LCSW

## 2020-12-29 DIAGNOSIS — Z20822 Contact with and (suspected) exposure to covid-19: Secondary | ICD-10-CM | POA: Diagnosis not present

## 2021-01-05 ENCOUNTER — Other Ambulatory Visit: Payer: Self-pay

## 2021-01-05 ENCOUNTER — Encounter (HOSPITAL_COMMUNITY): Payer: Self-pay | Admitting: Psychiatry

## 2021-01-05 ENCOUNTER — Ambulatory Visit (INDEPENDENT_AMBULATORY_CARE_PROVIDER_SITE_OTHER): Payer: BC Managed Care – PPO | Admitting: Psychiatry

## 2021-01-05 DIAGNOSIS — F330 Major depressive disorder, recurrent, mild: Secondary | ICD-10-CM

## 2021-01-05 NOTE — Progress Notes (Signed)
Virtual Visit via Video Note  I connected with Amanda Fowler on 01/05/21 at  2:30 PM EST by a video enabled telemedicine application and verified that I am speaking with the correct person using two identifiers.  Location: Patient: Patient Home Provider: Home Office  I discussed the limitations of evaluation and management by telemedicine and the availability of in person appointments. The patient expressed understanding and agreed to proceed.  History of Present Illness: MDD and GAD  Treatment Plan Goals: 1) To stabilize MH in order to make an informed decision about best plan to complete college degree. 2) To take medications as prescribed on a consistent bases, from missing 3-4 days a week to missing only one dose a week. 3) To improve self-care routine to decrease environmental stressors and improve hygiene, as evidenced by eating well, cleaning up room weekly, engaging in enjoyable activities and attending needed appointments.   Observations/Objective: Counselor met with Client for individual therapy via Webex. Counselor assessed MH symptoms and progress on treatment plan goals, with patient reportingthat she is feeling much better today and excited to be starting school this week. Client presented at happy, well-groomed and optimistic about start to new school year.Client presents withmoderatedepression and moderateanxiety. Client denied suicidal ideation or self-harm behaviors.   Goal 1: Counselor used MI interventions to assess and process decision making about school completion. Client noted that she is proud of the personal work she completed over the past 7 months in order to start school back in a better way. Client and Counselor took note of a variety of changes and improvements Client intentionally made to improve her outcomes and stability. Client was proud as she reflected back, and Counselor praised Client for taking ownership of her treatment.  Goal  2:Counselor assessed progress of last stages of decreasing Effexxor and current needs/issues. Client shared that over the past week she went through side effects and withdraws of coming off medication. Client noted that she was very sick for several days, which she anticipated. Client shared that she feels like herself again, being able to feel the full range of emotions. Individuals in her support system have noted positive changes in how she is and communicates with others. Client expressed gladness to be off the medication. Client taking other medications as prescribed. Counselor encouraged her to make doctor aware of the completion of medication.  Goal 3:   Assessment and Plan: Counselor will continue to meet with patient to address treatment plan goals. Patient will continue to follow recommendations of providers and implement skills learned in session.  Follow Up Instructions: Counselor will send information for next session via Webex.   The patient was advised to call back or seek an in-person evaluation if the symptoms worsen or if the condition fails to improve as anticipated.  I provided19mnutes of non-face-to-face time during this encounter.   BLise Auer LCSW

## 2021-01-06 ENCOUNTER — Ambulatory Visit (INDEPENDENT_AMBULATORY_CARE_PROVIDER_SITE_OTHER): Payer: BC Managed Care – PPO | Admitting: Psychiatry

## 2021-01-06 ENCOUNTER — Other Ambulatory Visit: Payer: Self-pay

## 2021-01-06 ENCOUNTER — Encounter (HOSPITAL_COMMUNITY): Payer: Self-pay | Admitting: Psychiatry

## 2021-01-06 DIAGNOSIS — F330 Major depressive disorder, recurrent, mild: Secondary | ICD-10-CM | POA: Diagnosis not present

## 2021-01-06 NOTE — Progress Notes (Signed)
Virtual Visit via Video Note  I connected with Amanda Fowler on 01/06/21 at 12:00 PM EST by a video enabled telemedicine application and verified that I am speaking with the correct person using two identifiers.  I discussed the limitations of evaluation and management by telemedicine and the availability of in person appointments. The patient expressed understanding and agreed to proceed.    Location: Patient: Patient Home Provider: Home Office   GROUP GOAL: Client will attend IOP Aftercare Group Therapy 2-4x a month to connect with peers and to apply strategies discussed to improve mental health condition.  History of Present Illness: MDD  Observations/Objective: Counselor met with Patient in the context of Group Therapy, via Webex. Counselor prompted Patient to share updates on coping skill application and individual therapeutic process in management of mental health. Client reports process of ending Effexor prescription and the positive effects of mood and expression. Client reports starting semester off well so far.  Counselor prompted Patient to share areas of concern, challenges and identify barriers to meeting current goals. Client reports self-doubt and negative intrusive thoughts counterintuitive to her desire to be successful in school this semester. Discussed cognitive coping skills to apply to combat thoughts. Topics covered: coping skills, self-worth, self-doubt, co-morbidities and grief and loss. Patient engaged in discussion, provided feedback for others within the group and took note of additional strategies reviewed and discussed.   Assessment and Plan: Counselor recommends client continue following treatment plan goals, following crisis plan and following up with behavioral health and medical providers as needed. Patient is welcome to join group at next session.   Follow Up Instructions: Counselor to provide link for next session via Webex platform. The patient was  advised to call back or seek an in-person evaluation if the symptoms worsen or if the condition fails to improve as anticipated.  I provided 75 minutes of non-face-to-face time during this encounter.   Lise Auer, LCSW

## 2021-01-13 ENCOUNTER — Ambulatory Visit (INDEPENDENT_AMBULATORY_CARE_PROVIDER_SITE_OTHER): Payer: BC Managed Care – PPO | Admitting: Psychiatry

## 2021-01-13 ENCOUNTER — Other Ambulatory Visit: Payer: Self-pay

## 2021-01-13 ENCOUNTER — Encounter (HOSPITAL_COMMUNITY): Payer: Self-pay | Admitting: Psychiatry

## 2021-01-13 DIAGNOSIS — F330 Major depressive disorder, recurrent, mild: Secondary | ICD-10-CM | POA: Diagnosis not present

## 2021-01-13 NOTE — Progress Notes (Signed)
Virtual Visit via Video Note  I connected with Amanda Fowler on 01/13/21 at 12:00 PM EST by a video enabled telemedicine application and verified that I am speaking with the correct person using two identifiers.  I discussed the limitations of evaluation and management by telemedicine and the availability of in person appointments. The patient expressed understanding and agreed to proceed.    Location: Patient: Patient Home Provider: Home Office   GROUP GOAL: Client will attend IOP Aftercare Group Therapy 2-4x a month to connect with peers and to apply strategies discussed to improve mental health condition.  History of Present Illness: MDD  Observations/Objective: Counselor met with Patient in the context of Group Therapy, via Webex. Counselor prompted Patient to share updates on coping skill application and individual therapeutic process in management of mental health. Client stated that she has been using a cognitive coping skills learned in her last therapy session to avoid distractions or perfectionism when doing school work. Client would also like to transfer skill to other areas of life. Counselor prompted Patient to share areas of concern, challenges and identify barriers to meeting current goals. Client noted that she may have an emotional issue with hoarding. Client would like to test ability to let go of items and discuss with individual therapist. Topics covered: celebrating sobriety, active distraction coping skills, boundary setting, root causes of negative thoughts, thought stopping, and signs of stabilization. Patient engaged in discussion, provided feedback for others within the group and took note of additional strategies reviewed and discussed.   Assessment and Plan: Counselor recommends client continue following treatment plan goals, following crisis plan and following up with behavioral health and medical providers as needed. Patient is welcome to join group at next  session.   Follow Up Instructions: Counselor to provide link for next session via Webex platform. The patient was advised to call back or seek an in-person evaluation if the symptoms worsen or if the condition fails to improve as anticipated.  I provided 75 minutes of non-face-to-face time during this encounter.   Lise Auer, LCSW

## 2021-01-19 ENCOUNTER — Ambulatory Visit (INDEPENDENT_AMBULATORY_CARE_PROVIDER_SITE_OTHER): Payer: BC Managed Care – PPO | Admitting: Psychiatry

## 2021-01-19 ENCOUNTER — Other Ambulatory Visit: Payer: Self-pay

## 2021-01-19 ENCOUNTER — Encounter (HOSPITAL_COMMUNITY): Payer: Self-pay | Admitting: Psychiatry

## 2021-01-19 DIAGNOSIS — F330 Major depressive disorder, recurrent, mild: Secondary | ICD-10-CM | POA: Diagnosis not present

## 2021-01-19 NOTE — Progress Notes (Signed)
Virtual Visit via Video Note  I connected with Amanda Fowler on 01/19/21 at  2:30 PM EST by a video enabled telemedicine application and verified that I am speaking with the correct person using two identifiers.  Location: Patient: Patient Home Provider: Home Office  I discussed the limitations of evaluation and management by telemedicine and the availability of in person appointments. The patient expressed understanding and agreed to proceed.  History of Present Illness: MDD and GAD  Treatment Plan Goals: 1) To stabilize MH in order to make an informed decision about best plan to complete college degree. 2) To take medications as prescribed on a consistent bases, from missing 3-4 days a week to missing only one dose a week. 3) To improve self-care routine to decrease environmental stressors and improve hygiene, as evidenced by eating well, cleaning up room weekly, engaging in enjoyable activities and attending needed appointments.   Observations/Objective: Counselor met with Client for individual therapy via Webex. Counselor assessed MH symptoms and progress on treatment plan goals, with patient reportingthat she is extremely tired. Counselor recommended Client get yearly lab work completed with primary care to rule out any physical health or medical issues, as exhaustion has persisted for months. Client had previously been associating tiredness with Effexxor withdraws, and since off prescription continues to experience extreme fatigue.Client presents withmilddepression and mildanxiety. Client denied suicidal ideation or self-harm behaviors.   Goal 1: Counselor used MI interventions to assess and process decision making about school completion.  Client reports that so far she is doing well in school. Client notes connecting with classmates for accountability and communicating needs with professors. Client reports "good grades" on graded assignments thus far.  Goal  2:Counselor assessed if client is taking medications as prescribed. Client stated that she has not missed any doses in the past 2 weeks. Client is using a monthly pill organizer to stay on track with getting refills filled and having medications more accessible.   Assessment and Plan: Counselor will continue to meet with patient to address treatment plan goals. Patient will continue to follow recommendations of providers and implement skills learned in session.  Follow Up Instructions: Counselor will send information for next session via Webex.   The patient was advised to call back or seek an in-person evaluation if the symptoms worsen or if the condition fails to improve as anticipated.  I provided65mnutes of non-face-to-face time during this encounter.   BLise Auer LCSW

## 2021-01-20 ENCOUNTER — Ambulatory Visit (INDEPENDENT_AMBULATORY_CARE_PROVIDER_SITE_OTHER): Payer: BC Managed Care – PPO | Admitting: Psychiatry

## 2021-01-20 ENCOUNTER — Encounter (HOSPITAL_COMMUNITY): Payer: Self-pay | Admitting: Psychiatry

## 2021-01-20 ENCOUNTER — Other Ambulatory Visit: Payer: Self-pay

## 2021-01-20 DIAGNOSIS — F330 Major depressive disorder, recurrent, mild: Secondary | ICD-10-CM

## 2021-01-20 NOTE — Progress Notes (Signed)
Virtual Visit via Video Note  I connected with Amanda Fowler on 01/20/21 at 12:00 PM EST by a video enabled telemedicine application and verified that I am speaking with the correct person using two identifiers.  I discussed the limitations of evaluation and management by telemedicine and the availability of in person appointments. The patient expressed understanding and agreed to proceed.    Location: Patient: Patient Home Provider: Home Office   GROUP GOAL: Client will attend IOP Aftercare Group Therapy 2-4x a month to connect with peers and to apply strategies discussed to improve mental health condition.  History of Present Illness: MDD  Observations/Objective: Counselor met with Patient in the context of Group Therapy, via Webex. Counselor prompted Patient to share updates on coping skill application and individual therapeutic process in management of mental health. Client reported that she is doing well in school so far and confronting mother more when triggered. Counselor prompted Patient to share areas of concern, challenges and identify barriers to meeting current goals. Client shared about challenges in social situations post-pandemic, not feeling like herself. Group shared perspective on their experiences and coping strategies to apply. Topics covered: socialization challenges during pandemic, recreational and healthy activities, mood trackers, and self-compassion. Patient engaged in discussion, provided feedback for others within the group and took note of additional strategies reviewed and discussed.   Assessment and Plan: Counselor recommends client continue following treatment plan goals, following crisis plan and following up with behavioral health and medical providers as needed. Patient is welcome to join group at next session.   Follow Up Instructions: Counselor to provide link for next session via Webex platform. The patient was advised to call back or seek an  in-person evaluation if the symptoms worsen or if the condition fails to improve as anticipated.  I provided 75 minutes of non-face-to-face time during this encounter.   Lise Auer, LCSW

## 2021-01-23 DIAGNOSIS — E559 Vitamin D deficiency, unspecified: Secondary | ICD-10-CM | POA: Diagnosis not present

## 2021-01-23 DIAGNOSIS — R5383 Other fatigue: Secondary | ICD-10-CM | POA: Diagnosis not present

## 2021-01-23 DIAGNOSIS — F988 Other specified behavioral and emotional disorders with onset usually occurring in childhood and adolescence: Secondary | ICD-10-CM | POA: Diagnosis not present

## 2021-01-27 ENCOUNTER — Other Ambulatory Visit: Payer: Self-pay

## 2021-01-27 ENCOUNTER — Ambulatory Visit (INDEPENDENT_AMBULATORY_CARE_PROVIDER_SITE_OTHER): Payer: BC Managed Care – PPO | Admitting: Psychiatry

## 2021-01-27 ENCOUNTER — Encounter (HOSPITAL_COMMUNITY): Payer: Self-pay | Admitting: Psychiatry

## 2021-01-27 DIAGNOSIS — F330 Major depressive disorder, recurrent, mild: Secondary | ICD-10-CM | POA: Diagnosis not present

## 2021-01-27 NOTE — Progress Notes (Signed)
Virtual Visit via Video Note  I connected with Amanda Fowler on 01/27/21 at 12:00 PM EST by a video enabled telemedicine application and verified that I am speaking with the correct person using two identifiers.  I discussed the limitations of evaluation and management by telemedicine and the availability of in person appointments. The patient expressed understanding and agreed to proceed.    Location: Patient: Patient Home Provider: Home Office   GROUP GOAL: Client will attend IOP Aftercare Group Therapy 2-4x a month to connect with peers and to apply strategies discussed to improve mental health condition.  History of Present Illness: MDD  Observations/Objective: Counselor met with Patient in the context of Group Therapy, via Webex. Counselor prompted Patient to share updates on coping skill application and individual therapeutic process in management of mental health. Client stated that she joined an exercise class and is connecting with friends in social settings, feeling more comfortable and less awkward. Counselor prompted Patient to share areas of concern, challenges and identify barriers to meeting current goals. Client reports having emotional highs and lows and continues to have extreme fatigue and tiredness, which has led to her missing classes the past week.Topics covered: managing anxiety, socialization, setting boundaries, communicating needs, and life transitions. Patient engaged in discussion, provided feedback for others within the group and took note of additional strategies reviewed and discussed.   Assessment and Plan: Counselor recommends client continue following treatment plan goals, following crisis plan and following up with behavioral health and medical providers as needed. Patient is welcome to join group at next session.   Follow Up Instructions: Counselor to provide link for next session via Webex platform. The patient was advised to call back or seek an  in-person evaluation if the symptoms worsen or if the condition fails to improve as anticipated.  I provided 75 minutes of non-face-to-face time during this encounter.   Lise Auer, LCSW

## 2021-02-03 ENCOUNTER — Ambulatory Visit (INDEPENDENT_AMBULATORY_CARE_PROVIDER_SITE_OTHER): Payer: BC Managed Care – PPO | Admitting: Psychiatry

## 2021-02-03 ENCOUNTER — Other Ambulatory Visit: Payer: Self-pay

## 2021-02-03 ENCOUNTER — Encounter (HOSPITAL_COMMUNITY): Payer: Self-pay | Admitting: Psychiatry

## 2021-02-03 DIAGNOSIS — F330 Major depressive disorder, recurrent, mild: Secondary | ICD-10-CM | POA: Diagnosis not present

## 2021-02-03 NOTE — Progress Notes (Signed)
Virtual Visit via Video Note  I connected with Amanda Fowler on 02/03/21 at 12:00 PM EST by a video enabled telemedicine application and verified that I am speaking with the correct person using two identifiers.  I discussed the limitations of evaluation and management by telemedicine and the availability of in person appointments. The patient expressed understanding and agreed to proceed.    Location: Patient: Patient Home Provider: Home Office   GROUP GOAL: Client will attend IOP Aftercare Group Therapy 2-4x a month to connect with peers and to apply strategies discussed to improve mental health condition.  History of Present Illness: MDD  Observations/Objective: Counselor met with Patient in the context of Group Therapy, via Webex. Counselor prompted Patient to share updates on coping skill application and individual therapeutic process in management of mental health. Client shared that she is intentionally journaling to combat negative thinking patterns. Counselor prompted Patient to share areas of concern, challenges and identify barriers to meeting current goals.  Client shared about concerning family dynamics that she was setting better boundaries,Topics covered: psychosis, self-harm, negative cognitions. Patient engaged in discussion, provided feedback for others within the group and took note of additional strategies reviewed and discussed.   Assessment and Plan: Counselor recommends client continue following treatment plan goals, following crisis plan and following up with behavioral health and medical providers as needed. Patient is welcome to join group at next session.   Follow Up Instructions: Counselor to provide link for next session via Webex platform. The patient was advised to call back or seek an in-person evaluation if the symptoms worsen or if the condition fails to improve as anticipated.  I provided 75 minutes of non-face-to-face time during this  encounter.   Lise Auer, LCSW

## 2021-02-05 ENCOUNTER — Ambulatory Visit (INDEPENDENT_AMBULATORY_CARE_PROVIDER_SITE_OTHER): Payer: BC Managed Care – PPO | Admitting: Psychiatry

## 2021-02-05 ENCOUNTER — Other Ambulatory Visit: Payer: Self-pay

## 2021-02-05 ENCOUNTER — Encounter (HOSPITAL_COMMUNITY): Payer: Self-pay | Admitting: Psychiatry

## 2021-02-05 DIAGNOSIS — F330 Major depressive disorder, recurrent, mild: Secondary | ICD-10-CM | POA: Diagnosis not present

## 2021-02-05 NOTE — Progress Notes (Signed)
Virtual Visit via Video Note  I connected with [name] Dennard on 02/05/21 at  1:30 PM EST by a video enabled telemedicine application and verified that I am speaking with the correct person using two identifiers.  Location: Patient: Patient Home Provider: Home Office  I discussed the limitations of evaluation and management by telemedicine and the availability of in person appointments. The patient expressed understanding and agreed to proceed.  History of Present Illness: MDD and GAD  Treatment Plan Goals: 1) To stabilize MH in order to make an informed decision about best plan to complete college degree. 2) To take medications as prescribed on a consistent bases, from missing 3-4 days a week to missing only one dose a week. 3) To improve self-care routine to decrease environmental stressors and improve hygiene, as evidenced by eating well, cleaning up room weekly, engaging in enjoyable activities and attending needed appointments.   Observations/Objective: Counselor met with Client for individual therapy via Webex. Counselor assessed MH symptoms and progress on treatment plan goals, with patient reportingthat she has been introspective and avoidant of school work. Client working to reengage and participate in academia to meet goal of graduating.Client presents withmilddepression and mildanxiety. Client denied suicidal ideation or self-harm behaviors.   Goal 1:Counselor used MI interventions to assess and process decision making about school completion.  Client notes that she is making all As in classes, but recently feeling less motivated to go to class, as well as, missing some classes and assignments. Client utilizing journaling, accountability and focusing on long-term outcomes.  Goal 2:Counselor assessed if client is taking medications as prescribed. Client endorsed taking medications as prescribed. Client to follow up with provider about low energy and lack of  motivation. Client to get lab work completed to rule out medical factors. Client reports that sleep continues to be dysregulated.  Goal 3:  Counselor assessed and explored alternative activities and behaviors to implement better habits around eating well, cleaning up room weekly, engaging in enjoyable activities and attending needed appointments. Client reports that she has been engaging in more social activities, however, lack joy and connection when with old friends and younger peers. Client discussed realizing she has new interests and goals that do not relate to people she use o spend time with. Client assessing how to move forward in relationships and how to communicate   Assessment and Plan: Counselor will continue to meet with patient to address treatment plan goals. Patient will continue to follow recommendations of providers and implement skills learned in session.  Follow Up Instructions: Counselor will send information for next session via Webex.   The patient was advised to call back or seek an in-person evaluation if the symptoms worsen or if the condition fails to improve as anticipated.  I provided60minutes of non-face-to-face time during this encounter.   Lise Auer, LCSW

## 2021-02-10 ENCOUNTER — Ambulatory Visit (INDEPENDENT_AMBULATORY_CARE_PROVIDER_SITE_OTHER): Payer: BC Managed Care – PPO | Admitting: Psychiatry

## 2021-02-10 ENCOUNTER — Encounter (HOSPITAL_COMMUNITY): Payer: Self-pay | Admitting: Psychiatry

## 2021-02-10 ENCOUNTER — Other Ambulatory Visit: Payer: Self-pay

## 2021-02-10 DIAGNOSIS — F330 Major depressive disorder, recurrent, mild: Secondary | ICD-10-CM | POA: Diagnosis not present

## 2021-02-10 NOTE — Progress Notes (Signed)
Virtual Visit via Video Note  I connected with Amanda Fowler on 02/10/21 at 12:00 PM EST by a video enabled telemedicine application and verified that I am speaking with the correct person using two identifiers.  I discussed the limitations of evaluation and management by telemedicine and the availability of in person appointments. The patient expressed understanding and agreed to proceed.    Location: Patient: Patient Home Provider: Home Office   GROUP GOAL: Client will attend IOP Aftercare Group Therapy 2-4x a month to connect with peers and to apply strategies discussed to improve mental health condition.  History of Present Illness: MDD  Observations/Objective: Counselor met with Patient in the context of Group Therapy, via Webex. Counselor prompted Patient to share updates on coping skill application and individual therapeutic process in management of mental health. 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 Group discussed extending grace, kindness, positivity and gratitude towards self in process, to combat negative cognitions. Counselor prompted Patient to share areas of concern, challenges and identify barriers to meeting current goals. Client shared that she is having challenges with balancing all aspects of life, such as school, household cleanliness, hygiene and social connections. Client plans to work on unrealistic expectations. Topics covered: utilizing support system, loneliness, understanding mental health and how to advocate for needs. Patient engaged in discussion, provided feedback for others within the group and took note of additional strategies reviewed and discussed.   Assessment and Plan: Counselor recommends client continue following treatment plan goals, following crisis plan and following up with behavioral health and medical providers as needed. Patient is welcome to join group at  next session.   Follow Up Instructions: Counselor to provide link for next session via Webex platform. The patient was advised to call back or seek an in-person evaluation if the symptoms worsen or if the condition fails to improve as anticipated.  I provided 90 minutes of non-face-to-face time during this encounter.   Lise Auer, LCSW

## 2021-02-17 ENCOUNTER — Encounter (HOSPITAL_COMMUNITY): Payer: Self-pay | Admitting: Psychiatry

## 2021-02-17 ENCOUNTER — Ambulatory Visit (INDEPENDENT_AMBULATORY_CARE_PROVIDER_SITE_OTHER): Payer: BC Managed Care – PPO | Admitting: Psychiatry

## 2021-02-17 ENCOUNTER — Other Ambulatory Visit: Payer: Self-pay

## 2021-02-17 DIAGNOSIS — F330 Major depressive disorder, recurrent, mild: Secondary | ICD-10-CM

## 2021-02-17 NOTE — Progress Notes (Signed)
Virtual Visit via Video Note  I connected with Amanda Fowler on 02/17/21 at 12:00 PM EST by a video enabled telemedicine application and verified that I am speaking with the correct person using two identifiers.  I discussed the limitations of evaluation and management by telemedicine and the availability of in person appointments. The patient expressed understanding and agreed to proceed.    Location: Patient: Patient Home Provider: Home Office   GROUP GOAL: Client will attend IOP Aftercare Group Therapy 2-4x a month to connect with peers and to apply strategies discussed to improve mental health condition.  History of Present Illness: Bipolar DO  Observations/Objective: Counselor met with Patient in the context of Group Therapy, via Webex. Counselor prompted Patient to share updates on coping skill application and individual therapeutic process in management of mental health. Client reported progress on maintaining well in school and achieving passing grades. Counselor prompted Patient to share areas of concern, challenges and identify barriers to meeting current goals. Client concerned about current relationship and roommate situation, impacting mental health and school productivity. Topics covered: alternative treatments, medications, side effects, depression, making important decisions and recreational activities. Patient engaged in discussion, provided feedback for others within the group and took note of additional strategies reviewed and discussed.   Assessment and Plan: Counselor recommends client continue following treatment plan goals, following crisis plan and following up with behavioral health and medical providers as needed. Patient is welcome to join group at next session.   Follow Up Instructions: Counselor to provide link for next session via Webex platform. The patient was advised to call back or seek an in-person evaluation if the symptoms worsen or if the condition  fails to improve as anticipated.  I provided 90 minutes of non-face-to-face time during this encounter.   Lise Auer, LCSW

## 2021-02-19 ENCOUNTER — Ambulatory Visit (INDEPENDENT_AMBULATORY_CARE_PROVIDER_SITE_OTHER): Payer: BC Managed Care – PPO | Admitting: Psychiatry

## 2021-02-19 ENCOUNTER — Other Ambulatory Visit: Payer: Self-pay

## 2021-02-19 ENCOUNTER — Encounter (HOSPITAL_COMMUNITY): Payer: Self-pay | Admitting: Psychiatry

## 2021-02-19 DIAGNOSIS — F330 Major depressive disorder, recurrent, mild: Secondary | ICD-10-CM | POA: Diagnosis not present

## 2021-02-19 NOTE — Progress Notes (Signed)
Virtual Visit via Video Note  I connected with Amanda Fowler on 02/19/21 at  1:30 PM EST by a video enabled telemedicine application and verified that I am speaking with the correct person using two identifiers.  Location: Patient: Patient Home Provider: Home Office  I discussed the limitations of evaluation and management by telemedicine and the availability of in person appointments. The patient expressed understanding and agreed to proceed.  History of Present Illness: MDD and GAD  Treatment Plan Goals: 1) To stabilize MH in order to make an informed decision about best plan to complete college degree. 2) To take medications as prescribed on a consistent bases, from missing 3-4 days a week to missing only one dose a week. 3) To improve self-care routine to decrease environmental stressors and improve hygiene, as evidenced by eating well, cleaning up room weekly, engaging in enjoyable activities and attending needed appointments.   Observations/Objective: Counselor met with Client for individual therapy via Webex. Counselor assessed MH symptoms and progress on treatment plan goals, with patient reportingthat she work up minutes before the session, due to sleep issues. Client identified that she is having troubling dreams. Client shared about the content and experience of the dreams to identify themes or patterns. Counselor applied CBT skills in processing impact of thoughts and feelings associated to dreaming. Client presents withmoderatedepression and moderateanxiety. Client denied suicidal ideation or self-harm behaviors.   Goal 1: Counselor used MI interventions to assess progress on Client's perspective on MH stabilization and motivation for completing her degree or pursuing work within her field of interest. Client identified a trauma experience that has been impacting her development, decision making, mood, and perspective on life. Counselor used TFCBT interventions  to allow and assist Client in processing thoughts and feelings associated with childhood trauma. Counselor provided Client with psychoeducation on the impacts of trauma and on developmental tasks and stages. Client reported therapeutic experience as relieving and healing, feeling supported and heard. Counselor encouraged Client to journal about thoughts and feelings related to trauma and to determine how she would like to move forward with addressing specific issues with family members. Client to report back at next session.  Assessment and Plan: Counselor will continue to meet with patient to address treatment plan goals. Patient will continue to follow recommendations of providers and implement skills learned in session.  Follow Up Instructions: Counselor will send information for next session via Webex.   The patient was advised to call back or seek an in-person evaluation if the symptoms worsen or if the condition fails to improve as anticipated.  I provided18minutes of non-face-to-face time during this encounter.   Lise Auer, LCSW

## 2021-02-24 ENCOUNTER — Ambulatory Visit (INDEPENDENT_AMBULATORY_CARE_PROVIDER_SITE_OTHER): Payer: BC Managed Care – PPO | Admitting: Psychiatry

## 2021-02-24 ENCOUNTER — Other Ambulatory Visit: Payer: Self-pay

## 2021-02-24 ENCOUNTER — Encounter (HOSPITAL_COMMUNITY): Payer: Self-pay | Admitting: Psychiatry

## 2021-02-24 DIAGNOSIS — F330 Major depressive disorder, recurrent, mild: Secondary | ICD-10-CM | POA: Diagnosis not present

## 2021-02-24 NOTE — Progress Notes (Signed)
Virtual Visit via Video Note  I connected with Amanda Fowler on 02/24/21 at 12:00 PM EST by a video enabled telemedicine application and verified that I am speaking with the correct person using two identifiers.  I discussed the limitations of evaluation and management by telemedicine and the availability of in person appointments. The patient expressed understanding and agreed to proceed.    Location: Patient: Patient Home Provider: Home Office   GROUP GOAL: Client will attend IOP Aftercare Group Therapy 2-4x a month to connect with peers and to apply strategies discussed to improve mental health condition.  History of Present Illness: MDD  Observations/Objective: Counselor met with Patient in the context of Group Therapy, via Webex. Counselor prompted Patient to share updates on coping skill application and individual therapeutic process in management of mental health. Counselor prompted Patient to share areas of concern, challenges and identify barriers to meeting current goals. Topics covered: grief and loss, future plans, depressive symptoms, weekly structure, and distraction as a coping strategy. Patient engaged in discussion, provided feedback for others within the group and took note of additional strategies reviewed and discussed.   Assessment and Plan: Counselor recommends client continue following treatment plan goals, following crisis plan and following up with behavioral health and medical providers as needed. Patient is welcome to join group at next session.   Follow Up Instructions: Counselor to provide link for next session via Webex platform. The patient was advised to call back or seek an in-person evaluation if the symptoms worsen or if the condition fails to improve as anticipated.  I provided 75 minutes of non-face-to-face time during this encounter.   Lise Auer, LCSW

## 2021-03-03 ENCOUNTER — Other Ambulatory Visit: Payer: Self-pay

## 2021-03-03 ENCOUNTER — Encounter (HOSPITAL_COMMUNITY): Payer: Self-pay | Admitting: Psychiatry

## 2021-03-03 ENCOUNTER — Ambulatory Visit (INDEPENDENT_AMBULATORY_CARE_PROVIDER_SITE_OTHER): Payer: BC Managed Care – PPO | Admitting: Psychiatry

## 2021-03-03 DIAGNOSIS — F330 Major depressive disorder, recurrent, mild: Secondary | ICD-10-CM | POA: Diagnosis not present

## 2021-03-03 NOTE — Progress Notes (Signed)
Virtual Visit via Video Note  I connected with Amanda Fowler on 03/03/21 at 12:00 PM EST by a video enabled telemedicine application and verified that I am speaking with the correct person using two identifiers.  I discussed the limitations of evaluation and management by telemedicine and the availability of in person appointments. The patient expressed understanding and agreed to proceed.    Location: Patient: Patient Home Provider: Home Office   GROUP GOAL: Client will attend IOP Aftercare Group Therapy 2-4x a month to connect with peers and to apply strategies discussed to improve mental health condition.  History of Present Illness: MDD  Observations/Objective: Counselor met with Patient in the context of Group Therapy, via Webex. Counselor prompted Patient to share updates on coping skill application and individual therapeutic process in management of mental health. Client reports holding needed conversations with support system. Client on spring break, working to organize life to finish semester successfully. Counselor prompted Patient to share areas of concern, challenges and identify barriers to meeting current goals. Client experiencing loneliness and transitioning in relationship status. Topics covered: childhood traumas, intimacy and sex drive issues, depressive thoughts, coping with chronic mental illness, and combating lonliness. Patient engaged in discussion, provided feedback for others within the group and took note of additional strategies reviewed and discussed.   Assessment and Plan: Counselor recommends client continue following treatment plan goals, following crisis plan and following up with behavioral health and medical providers as needed. Patient is welcome to join group at next session.   Follow Up Instructions: Counselor to provide link for next session via Webex platform. The patient was advised to call back or seek an in-person evaluation if the symptoms  worsen or if the condition fails to improve as anticipated.  I provided 90 minutes of non-face-to-face time during this encounter.   Lise Auer, LCSW

## 2021-03-05 ENCOUNTER — Other Ambulatory Visit: Payer: Self-pay

## 2021-03-05 ENCOUNTER — Ambulatory Visit (INDEPENDENT_AMBULATORY_CARE_PROVIDER_SITE_OTHER): Payer: BC Managed Care – PPO | Admitting: Psychiatry

## 2021-03-05 ENCOUNTER — Encounter (HOSPITAL_COMMUNITY): Payer: Self-pay | Admitting: Psychiatry

## 2021-03-05 DIAGNOSIS — F411 Generalized anxiety disorder: Secondary | ICD-10-CM

## 2021-03-05 DIAGNOSIS — F330 Major depressive disorder, recurrent, mild: Secondary | ICD-10-CM | POA: Diagnosis not present

## 2021-03-05 NOTE — Progress Notes (Signed)
Virtual Visit via Video Note  I connected with Amanda Fowler on 03/05/21 at  1:30 PM EST by a video enabled telemedicine application and verified that I am speaking with the correct person using two identifiers.  Location: Patient: Patient Home Provider: Home Office  I discussed the limitations of evaluation and management by telemedicine and the availability of in person appointments. The patient expressed understanding and agreed to proceed.  History of Present Illness: MDD and GAD  Treatment Plan Goals: 1) To stabilize MH in order to make an informed decision about best plan to complete college degree. 2) To take medications as prescribed on a consistent bases, from missing 3-4 days a week to missing only one dose a week. 3) To improve self-care routine to decrease environmental stressors and improve hygiene, as evidenced by eating well, cleaning up room weekly, engaging in enjoyable activities and attending needed appointments.   Observations/Objective: Counselor met with Client for individual therapy via Webex. Counselor assessed MH symptoms and progress on treatment plan goals, with patient reportingthat she is on Spring Break, causing daily routine to change, with an increase in socialization and relaxation.Client presents withmilddepression and moderateanxiety. Client denied suicidal ideation or self-harm behaviors.   Goal 1: Counselor used MI interventions to assess progress on Client's perspective on MH stabilization and motivation for completing her degree or pursuing work within her field of interest.Client noted that she is scoring well and participating in classes. Client looking into summer internship program to further studies. Counselor assessed and processed plan to address and maintain mental health stabilization. Client reports feeling capable and aware of how to better manage mental health needs.   Goal 2: Counselor assess medication compliance.  Client reports taking all doses of medications, however, due to change in schedule, Client has taken dosages later than normal on 4 out of 7 days. Client hopeful to return to routine and compliance once classes start back next week.   Goal 3: Counselor assessed application of self-care practices and overall stress levels. Client stated that she has been productive with time, catching up on laundry, reconnecting with peers and taking time to rest and relax. Client concerned she may be overdoing social drinking and socialization. Counselor prompted Client to identify coping strategies in preventative behaviors to reduce over-consumption of alcohol. Client reports willingness to apply skills.   Assessment and Plan: Counselor will continue to meet with patient to address treatment plan goals. Patient will continue to follow recommendations of providers and implement skills learned in session.  Follow Up Instructions: Counselor will send information for next session via Webex.   The patient was advised to call back or seek an in-person evaluation if the symptoms worsen or if the condition fails to improve as anticipated.  I provided63mnutes of non-face-to-face time during this encounter.   BLise Auer LCSW

## 2021-03-10 ENCOUNTER — Other Ambulatory Visit: Payer: Self-pay

## 2021-03-10 ENCOUNTER — Ambulatory Visit (INDEPENDENT_AMBULATORY_CARE_PROVIDER_SITE_OTHER): Payer: BC Managed Care – PPO | Admitting: Psychiatry

## 2021-03-10 DIAGNOSIS — F330 Major depressive disorder, recurrent, mild: Secondary | ICD-10-CM

## 2021-03-11 ENCOUNTER — Encounter (HOSPITAL_COMMUNITY): Payer: Self-pay | Admitting: Psychiatry

## 2021-03-11 NOTE — Progress Notes (Signed)
Virtual Visit via Video Note  I connected with Amanda Fowler on 03/10/21 at 12:00 PM EDT by a video enabled telemedicine application and verified that I am speaking with the correct person using two identifiers.  I discussed the limitations of evaluation and management by telemedicine and the availability of in person appointments. The patient expressed understanding and agreed to proceed.    Location: Patient: Patient Home Provider: Home Office   GROUP GOAL: Client will attend IOP Aftercare Group Therapy 2-4x a month to connect with peers and to apply strategies discussed to improve mental health condition.  History of Present Illness: MDD  Observations/Objective: Counselor met with Patient in the context of Group Therapy, via Webex. Counselor prompted Patient to share updates on coping skill application and individual therapeutic process in management of mental health. Counselor prompted Patient to share areas of concern, challenges and identify barriers to meeting current goals. Topics covered: communicating effectively with psychiatrists, medication compliance, communication with support system, low energy, subconscious trauma/grief triggers, and specialized treatment for OCD. Patient engaged in discussion, provided feedback for others within the group and took note of additional strategies reviewed and discussed.   Assessment and Plan: Counselor recommends client continue following treatment plan goals, following crisis plan and following up with behavioral health and medical providers as needed. Patient is welcome to join group at next session.   Follow Up Instructions: Counselor to provide link for next session via Webex platform. The patient was advised to call back or seek an in-person evaluation if the symptoms worsen or if the condition fails to improve as anticipated.  I provided 70 minutes of non-face-to-face time during this encounter.   Lise Auer, LCSW

## 2021-03-17 ENCOUNTER — Other Ambulatory Visit: Payer: Self-pay

## 2021-03-17 ENCOUNTER — Ambulatory Visit (INDEPENDENT_AMBULATORY_CARE_PROVIDER_SITE_OTHER): Payer: BC Managed Care – PPO | Admitting: Psychiatry

## 2021-03-17 ENCOUNTER — Encounter (HOSPITAL_COMMUNITY): Payer: Self-pay | Admitting: Psychiatry

## 2021-03-17 DIAGNOSIS — F330 Major depressive disorder, recurrent, mild: Secondary | ICD-10-CM | POA: Diagnosis not present

## 2021-03-17 NOTE — Progress Notes (Signed)
Virtual Visit via Video Note  I connected with Amanda Fowler on 03/17/21 at 12:00 PM EDT by a video enabled telemedicine application and verified that I am speaking with the correct person using two identifiers.  I discussed the limitations of evaluation and management by telemedicine and the availability of in person appointments. The patient expressed understanding and agreed to proceed.    Location: Patient: Patient Home Provider: Home Office   GROUP GOAL: Client will attend IOP Aftercare Group Therapy 2-4x a month to connect with peers and to apply strategies discussed to improve mental health condition.  History of Present Illness: MDD  Observations/Objective: Counselor met with Patient in the context of Group Therapy, via Webex. Counselor prompted Patient to share updates on coping skill application and individual therapeutic process in management of mental health. Client reports intentional efforts to maintain mental health.  Counselor prompted Patient to share areas of concern, challenges and identify barriers to meeting current goals. Topics covered: medication compliance, provider-client relationship building, communication skills, addressing verbal/emotional abuse, and enhancing self-esteem. Patient engaged in discussion, provided feedback for others within the group and took note of additional strategies reviewed and discussed.   Assessment and Plan: Counselor recommends client continue following treatment plan goals, following crisis plan and following up with behavioral health and medical providers as needed. Patient is welcome to join group at next session.   Follow Up Instructions: Counselor to provide link for next session via Webex platform. The patient was advised to call back or seek an in-person evaluation if the symptoms worsen or if the condition fails to improve as anticipated.  I provided 70 minutes of non-face-to-face time during this encounter.   Amanda Auer, LCSW

## 2021-03-19 ENCOUNTER — Ambulatory Visit (HOSPITAL_COMMUNITY): Payer: BC Managed Care – PPO | Admitting: Psychiatry

## 2021-03-24 ENCOUNTER — Other Ambulatory Visit: Payer: Self-pay

## 2021-03-24 ENCOUNTER — Encounter (HOSPITAL_COMMUNITY): Payer: Self-pay | Admitting: Psychiatry

## 2021-03-24 ENCOUNTER — Ambulatory Visit (INDEPENDENT_AMBULATORY_CARE_PROVIDER_SITE_OTHER): Payer: BC Managed Care – PPO | Admitting: Psychiatry

## 2021-03-24 DIAGNOSIS — F411 Generalized anxiety disorder: Secondary | ICD-10-CM

## 2021-03-24 DIAGNOSIS — F330 Major depressive disorder, recurrent, mild: Secondary | ICD-10-CM

## 2021-03-24 NOTE — Progress Notes (Signed)
Virtual Visit via Video Note  I connected with Amanda Fowler on 03/24/21 at 12:00 PM EDT by a video enabled telemedicine application and verified that I am speaking with the correct person using two identifiers.  I discussed the limitations of evaluation and management by telemedicine and the availability of in person appointments. The patient expressed understanding and agreed to proceed.    Location: Patient: Patient Home Provider: Home Office   GROUP GOAL: Client will attend IOP Aftercare Group Therapy 2-4x a month to connect with peers and to apply strategies discussed to improve mental health condition.  History of Present Illness: MDD and GAD  Observations/Objective: Counselor met with Patient in the context of Group Therapy, via Webex. Counselor prompted Patient to share updates on coping skill application and individual therapeutic process in management of mental health. Client reports intentional efforts to maintain mental health.  Counselor prompted Patient to share areas of concern, challenges and identify barriers to meeting current goals. Topics covered: life transitions, getting back engaged in personal interests and activities, safety plans and exit plans for challenging situatitons. Patient engaged in discussion, provided feedback for others within the group and took note of additional strategies reviewed and discussed.   Assessment and Plan: Counselor recommends client continue following treatment plan goals, following crisis plan and following up with behavioral health and medical providers as needed. Patient is welcome to join group at next session.   Follow Up Instructions: Counselor to provide link for next session via Webex platform. The patient was advised to call back or seek an in-person evaluation if the symptoms worsen or if the condition fails to improve as anticipated.  I provided 70 minutes of non-face-to-face time during this encounter.     , LCSW 

## 2021-03-25 ENCOUNTER — Other Ambulatory Visit: Payer: Self-pay

## 2021-03-25 ENCOUNTER — Ambulatory Visit (INDEPENDENT_AMBULATORY_CARE_PROVIDER_SITE_OTHER): Payer: BC Managed Care – PPO | Admitting: Psychiatry

## 2021-03-25 DIAGNOSIS — F411 Generalized anxiety disorder: Secondary | ICD-10-CM | POA: Diagnosis not present

## 2021-03-25 DIAGNOSIS — F330 Major depressive disorder, recurrent, mild: Secondary | ICD-10-CM

## 2021-03-25 NOTE — Addendum Note (Signed)
Addended by: Hilbert Odor on: 03/25/2021 01:22 PM   Modules accepted: Level of Service

## 2021-03-26 ENCOUNTER — Encounter (HOSPITAL_COMMUNITY): Payer: Self-pay | Admitting: Psychiatry

## 2021-03-26 NOTE — Progress Notes (Signed)
Virtual Visit via Video Note  I connected with Amanda Fowler on 03/26/21 at  2:30 PM EDT by a video enabled telemedicine application and verified that I am speaking with the correct person using two identifiers.  Location: Patient: Patient Home Provider: Home Office  I discussed the limitations of evaluation and management by telemedicine and the availability of in person appointments. The patient expressed understanding and agreed to proceed.  History of Present Illness: MDD and GAD  Treatment Plan Goals: 1) To stabilize MH in order to make an informed decision about best plan to complete college degree. 2) To take medications as prescribed on a consistent bases, from missing 3-4 days a week to missing only one dose a week. 3) To improve self-care routine to decrease environmental stressors and improve hygiene, as evidenced by eating well, cleaning up room weekly, engaging in enjoyable activities and attending needed appointments.   Observations/Objective: Counselor met with Client for individual therapy via Webex. Counselor assessed MH symptoms and progress on treatment plan goals, with patient reportingthat she is feeling "overly emotional", having emotional outbursts daily, impacting her overall functioning. Client presents withmodretedepression and moderateanxiety. Client denied suicidal ideation or self-harm behaviors.   Goal 1: Counselor used MI interventions to assess progress on Client's perspective on MH stabilization and motivation for completing her degree or pursuing work within her field of interest.Client shared about new comparisons she has been fixated on regarding her brothers educational;life experiences, vs her own. Client shared history, attempts to decrease and how she is communicating with her family about the issue. Counselor used CBT interventions to process her thoughts, feelings and behaviors to become more realistic and rational about how she  would like to address moving forward. Client engaged well in processing.   Goal 2: Counselor assess medication compliance. Client reports taking all doses of medications as prescribed, 7 out of 7 days.   Goal 3: Counselor assessed application of self-care practices and overall stress levels. Client discussed decline in self care practices. Counselor and client identified ways to regain structure in schedule post spring break in order to maintain health and focus for the remainder of the semester. Client to  Decrease screen time and increase biking to class.   Assessment and Plan: Counselor will continue to meet with patient to address treatment plan goals. Patient will continue to follow recommendations of providers and implement skills learned in session.  Follow Up Instructions: Counselor will send information for next session via Webex.   The patient was advised to call back or seek an in-person evaluation if the symptoms worsen or if the condition fails to improve as anticipated.  I provided63minutes of non-face-to-face time during this encounter.   Lise Auer, LCSW

## 2021-03-31 ENCOUNTER — Ambulatory Visit (INDEPENDENT_AMBULATORY_CARE_PROVIDER_SITE_OTHER): Payer: BC Managed Care – PPO | Admitting: Psychiatry

## 2021-03-31 ENCOUNTER — Encounter (HOSPITAL_COMMUNITY): Payer: Self-pay | Admitting: Psychiatry

## 2021-03-31 ENCOUNTER — Other Ambulatory Visit: Payer: Self-pay

## 2021-03-31 DIAGNOSIS — F330 Major depressive disorder, recurrent, mild: Secondary | ICD-10-CM | POA: Diagnosis not present

## 2021-03-31 DIAGNOSIS — F411 Generalized anxiety disorder: Secondary | ICD-10-CM | POA: Diagnosis not present

## 2021-03-31 NOTE — Progress Notes (Signed)
Virtual Visit via Video Note  I connected with Amanda Fowler on 03/31/21 at 12:00 PM EDT by a video enabled telemedicine application and verified that I am speaking with the correct person using two identifiers.  I discussed the limitations of evaluation and management by telemedicine and the availability of in person appointments. The patient expressed understanding and agreed to proceed.    Location: Patient: Patient Home Provider: Home Office   GROUP GOAL: Client will attend IOP Aftercare Group Therapy 2-4x a month to connect with peers and to apply strategies discussed to improve mental health condition.  History of Present Illness: MDD and GAD  Observations/Objective: Counselor met with Patient in the context of Group Therapy, via Webex. Counselor prompted Patient to share updates on coping skill application and individual therapeutic process in management of mental health. Client reports intentional efforts to maintain mental health.  Counselor prompted Patient to share areas of concern, challenges and identify barriers to meeting current goals. Topics covered: survivor anniversaries, sobriety, assertive communication, boundaries, medication management, coping skill application, PTSD symptoms, and stress management. Patient engaged in discussion, provided feedback for others within the group and took note of additional strategies reviewed and discussed.   Assessment and Plan: Counselor recommends client continue following treatment plan goals, following crisis plan and following up with behavioral health and medical providers as needed. Patient is welcome to join group at next session.   Follow Up Instructions: Counselor to provide link for next session via Webex platform. The patient was advised to call back or seek an in-person evaluation if the symptoms worsen or if the condition fails to improve as anticipated.  I provided 80 minutes of non-face-to-face time during this  encounter.   Lise Auer, LCSW

## 2021-04-07 ENCOUNTER — Ambulatory Visit (INDEPENDENT_AMBULATORY_CARE_PROVIDER_SITE_OTHER): Payer: BC Managed Care – PPO | Admitting: Psychiatry

## 2021-04-07 ENCOUNTER — Encounter (HOSPITAL_COMMUNITY): Payer: Self-pay | Admitting: Psychiatry

## 2021-04-07 ENCOUNTER — Other Ambulatory Visit: Payer: Self-pay

## 2021-04-07 DIAGNOSIS — F330 Major depressive disorder, recurrent, mild: Secondary | ICD-10-CM | POA: Diagnosis not present

## 2021-04-07 NOTE — Progress Notes (Signed)
Virtual Visit via Video Note  I connected with Amanda Fowler on 04/07/21 at 12:00 PM EDT by a video enabled telemedicine application and verified that I am speaking with the correct person using two identifiers.  I discussed the limitations of evaluation and management by telemedicine and the availability of in person appointments. The patient expressed understanding and agreed to proceed.    Location: Patient: Patient Home Provider: Home Office   GROUP GOAL: Client will attend IOP Aftercare Group Therapy 2-4x a month to connect with peers and to apply strategies discussed to improve mental health condition.  History of Present Illness: MDD  Observations/Objective: Counselor met with Patient in the context of Group Therapy, via Webex. Counselor prompted Patient to share updates on coping skill application and individual therapeutic process in management of mental health. Client reports intentional efforts to maintain mental health.  Counselor prompted Patient to share areas of concern, challenges and identify barriers to meeting current goals. Topics covered: preparing for stressful events, challenging self to change habits, future planning, communicating needs, and medication compliance. Patient engaged in discussion, provided feedback for others within the group and took note of additional strategies reviewed and discussed.   Assessment and Plan: Counselor recommends client continue following treatment plan goals, following crisis plan and following up with behavioral health and medical providers as needed. Patient is welcome to join group at next session.   Follow Up Instructions: Counselor to provide link for next session via Webex platform. The patient was advised to call back or seek an in-person evaluation if the symptoms worsen or if the condition fails to improve as anticipated.  I provided 80 minutes of non-face-to-face time during this encounter.   Lise Auer,  LCSW

## 2021-04-08 ENCOUNTER — Ambulatory Visit (INDEPENDENT_AMBULATORY_CARE_PROVIDER_SITE_OTHER): Payer: BC Managed Care – PPO | Admitting: Psychiatry

## 2021-04-08 ENCOUNTER — Encounter (HOSPITAL_COMMUNITY): Payer: Self-pay | Admitting: Psychiatry

## 2021-04-08 ENCOUNTER — Other Ambulatory Visit: Payer: Self-pay

## 2021-04-08 DIAGNOSIS — F411 Generalized anxiety disorder: Secondary | ICD-10-CM | POA: Diagnosis not present

## 2021-04-08 DIAGNOSIS — F330 Major depressive disorder, recurrent, mild: Secondary | ICD-10-CM | POA: Diagnosis not present

## 2021-04-08 NOTE — Progress Notes (Signed)
Virtual Visit via Video Note  I connected with Amanda Fowler on 04/08/21 at  2:30 PM EDT by a video enabled telemedicine application and verified that I am speaking with the correct person using two identifiers.  Location: Patient: Patient Home Provider: Home Office  I discussed the limitations of evaluation and management by telemedicine and the availability of in person appointments. The patient expressed understanding and agreed to proceed.  History of Present Illness: MDD and GAD  Treatment Plan Goals: 1) To stabilize MH in order to make an informed decision about best plan to complete college degree. 2) To take medications as prescribed on a consistent bases, from missing 3-4 days a week to missing only one dose a week. 3) To improve self-care routine to decrease environmental stressors and improve hygiene, as evidenced by eating well, cleaning up room weekly, engaging in enjoyable activities and attending needed appointments.   Observations/Objective: Counselor met with Client for individual therapy via Webex. Counselor assessed MH symptoms and progress on treatment plan goals, with patient reportingthat she is overwhelmed with school work and obligations Client presents withmilddepression and moderateanxiety. Client denied suicidal ideation or self-harm behaviors.   Goal 1: Counselor used MI interventions to assess progress on Client's perspective on MH stabilization and motivation for completing her degree or pursuing work within her field of interest.Client stated feeling overwhelmed with the amount of work needed to complete before finals week. Counselor reviewed stress management and organizational skills to address procrastination and lack of motivation. Counselor and Client derived a rewards system, which incorporates a number of coping skills learned in treatment as rewards for accomplishment of smaller tasks to reduce avoidance. Client to report back on  outcomes at next session.   Goal 2: Counselor assess medication compliance.Client reports taking all doses of medications as prescribed, 7 out of 7 days. Client reports no current issues with medications and understands the importance of compliance with overall functioning.   Goal 3:Counselor assessed application of self-care practices and overall stress levels.Client discussed attempts to reduce screentime, with out significant success. Counselor highlighted the progress and intention. Counselor and Client discussed smaller goals around screentime and replacement behaviors. Client interested in applying strategies to report back out comes.   Assessment and Plan: Counselor will continue to meet with patient to address treatment plan goals. Patient will continue to follow recommendations of providers and implement skills learned in session.  Follow Up Instructions: Counselor will send information for next session via Webex.   The patient was advised to call back or seek an in-person evaluation if the symptoms worsen or if the condition fails to improve as anticipated.  I provided82minutes of non-face-to-face time during this encounter.   Lise Auer, LCSW

## 2021-04-09 ENCOUNTER — Emergency Department (HOSPITAL_BASED_OUTPATIENT_CLINIC_OR_DEPARTMENT_OTHER)
Admission: EM | Admit: 2021-04-09 | Discharge: 2021-04-09 | Disposition: A | Discharge status: Home / Self Care | Payer: BC Managed Care – PPO | Attending: Emergency Medicine | Admitting: Emergency Medicine

## 2021-04-09 ENCOUNTER — Emergency Department (HOSPITAL_BASED_OUTPATIENT_CLINIC_OR_DEPARTMENT_OTHER): Payer: BC Managed Care – PPO

## 2021-04-09 ENCOUNTER — Other Ambulatory Visit: Payer: Self-pay

## 2021-04-09 ENCOUNTER — Encounter (HOSPITAL_BASED_OUTPATIENT_CLINIC_OR_DEPARTMENT_OTHER): Payer: Self-pay

## 2021-04-09 DIAGNOSIS — R1031 Right lower quadrant pain: Secondary | ICD-10-CM | POA: Diagnosis not present

## 2021-04-09 DIAGNOSIS — N898 Other specified noninflammatory disorders of vagina: Secondary | ICD-10-CM | POA: Insufficient documentation

## 2021-04-09 DIAGNOSIS — F17290 Nicotine dependence, other tobacco product, uncomplicated: Secondary | ICD-10-CM | POA: Insufficient documentation

## 2021-04-09 DIAGNOSIS — N83201 Unspecified ovarian cyst, right side: Secondary | ICD-10-CM | POA: Diagnosis not present

## 2021-04-09 DIAGNOSIS — K5900 Constipation, unspecified: Secondary | ICD-10-CM | POA: Insufficient documentation

## 2021-04-09 DIAGNOSIS — R1013 Epigastric pain: Secondary | ICD-10-CM | POA: Diagnosis not present

## 2021-04-09 DIAGNOSIS — R102 Pelvic and perineal pain: Secondary | ICD-10-CM | POA: Diagnosis not present

## 2021-04-09 DIAGNOSIS — Z79899 Other long term (current) drug therapy: Secondary | ICD-10-CM | POA: Insufficient documentation

## 2021-04-09 DIAGNOSIS — N8301 Follicular cyst of right ovary: Secondary | ICD-10-CM | POA: Diagnosis not present

## 2021-04-09 LAB — CBC WITH DIFFERENTIAL/PLATELET
Abs Immature Granulocytes: 0.01 10*3/uL (ref 0.00–0.07)
Basophils Absolute: 0 10*3/uL (ref 0.0–0.1)
Basophils Relative: 1 %
Eosinophils Absolute: 0.2 10*3/uL (ref 0.0–0.5)
Eosinophils Relative: 2 %
HCT: 38.4 % (ref 36.0–46.0)
Hemoglobin: 12.8 g/dL (ref 12.0–15.0)
Immature Granulocytes: 0 %
Lymphocytes Relative: 30 %
Lymphs Abs: 2.6 10*3/uL (ref 0.7–4.0)
MCH: 30 pg (ref 26.0–34.0)
MCHC: 33.3 g/dL (ref 30.0–36.0)
MCV: 89.9 fL (ref 80.0–100.0)
Monocytes Absolute: 0.6 10*3/uL (ref 0.1–1.0)
Monocytes Relative: 7 %
Neutro Abs: 5.2 10*3/uL (ref 1.7–7.7)
Neutrophils Relative %: 60 %
Platelets: 359 10*3/uL (ref 150–400)
RBC: 4.27 MIL/uL (ref 3.87–5.11)
RDW: 12.5 % (ref 11.5–15.5)
WBC: 8.7 10*3/uL (ref 4.0–10.5)
nRBC: 0 % (ref 0.0–0.2)

## 2021-04-09 LAB — COMPREHENSIVE METABOLIC PANEL
ALT: 21 U/L (ref 0–44)
AST: 18 U/L (ref 15–41)
Albumin: 4 g/dL (ref 3.5–5.0)
Alkaline Phosphatase: 63 U/L (ref 38–126)
Anion gap: 9 (ref 5–15)
BUN: 9 mg/dL (ref 6–20)
CO2: 24 mmol/L (ref 22–32)
Calcium: 9 mg/dL (ref 8.9–10.3)
Chloride: 102 mmol/L (ref 98–111)
Creatinine, Ser: 0.82 mg/dL (ref 0.44–1.00)
GFR, Estimated: >60 mL/min (ref >60)
Glucose, Bld: 93 mg/dL (ref 70–99)
Potassium: 4 mmol/L (ref 3.5–5.1)
Sodium: 135 mmol/L (ref 135–145)
Total Bilirubin: <0.1 mg/dL — ABNORMAL LOW (ref 0.3–1.2)
Total Protein: 7.2 g/dL (ref 6.5–8.1)

## 2021-04-09 LAB — URINALYSIS, ROUTINE W REFLEX MICROSCOPIC
Bilirubin Urine: NEGATIVE
Glucose, UA: NEGATIVE mg/dL
Hgb urine dipstick: NEGATIVE
Ketones, ur: NEGATIVE mg/dL
Leukocytes,Ua: NEGATIVE
Nitrite: NEGATIVE
Protein, ur: NEGATIVE mg/dL
Specific Gravity, Urine: 1.02 (ref 1.005–1.030)
pH: 7 (ref 5.0–8.0)

## 2021-04-09 LAB — WET PREP, GENITAL
Clue Cells Wet Prep HPF POC: NONE SEEN
Sperm: NONE SEEN
Trich, Wet Prep: NONE SEEN
Yeast Wet Prep HPF POC: NONE SEEN

## 2021-04-09 LAB — PREGNANCY, URINE: Preg Test, Ur: NEGATIVE

## 2021-04-09 LAB — LIPASE, BLOOD: Lipase: 25 U/L (ref 11–51)

## 2021-04-09 MED ORDER — SODIUM CHLORIDE 0.9 % IV SOLN
1.0000 g | Freq: Once | INTRAVENOUS | Status: AC
  Administered 2021-04-09: 1 g via INTRAVENOUS
  Filled 2021-04-09: qty 10

## 2021-04-09 MED ORDER — IBUPROFEN 800 MG PO TABS: 800.0000 mg | ORAL_TABLET | Freq: Three times a day (TID) | ORAL | 0 refills | Status: AC | PRN

## 2021-04-09 MED ORDER — DOXYCYCLINE HYCLATE 100 MG PO CAPS: 100.0000 mg | ORAL_CAPSULE | Freq: Two times a day (BID) | ORAL | 0 refills | Status: AC

## 2021-04-09 MED ORDER — DEXTROSE 5 % IV SOLN: 500.0000 mg | Freq: Once | INTRAVENOUS | Status: DC

## 2021-04-09 NOTE — ED Notes (Signed)
ED Provider at bedside. Nurse present at bedside for pelvic exam

## 2021-04-09 NOTE — ED Provider Notes (Signed)
[facility] EMERGENCY DEPARTMENT Provider Note   CSN: [id number] Arrival date & time: 04/09/21  1604     History No chief complaint on file.   Amanda Fowler is a 25 y.o. female.  25 year old female with past medical history below including anxiety/depression, migraines who presents with abdominal pain.  Patient began having abdominal pain approximately 4 days ago.  Pain has been intermittent.  It initially began in her mid epigastrium and has moved down to her lower abdomen.  Pain is worse when her bladder is full and if she strains hard to urinate but she denies any dysuria.  Her urine has been slightly darker than usual.  She denies any associated nausea, vomiting, diarrhea.  She has had some mild constipation but mostly due to the fact that she does not want to strain to have a bowel movement because of the pain.  She denies anorexia.  No association with eating.  No fevers or URI symptoms.  She has never had this pain before and denies any history of kidney stones.  Pain is worse with movement and better sitting still.  PCP saw her today and referred her to the ED for further evaluation.  Is sexually active with 1 partner for the past 3 years.  Does not use condoms.  The history is provided by the patient.       Past Medical History:  Diagnosis Date  . ADHD   . Anxiety   . Depression   . Heart murmur   . Heart murmur   . Migraines    with aura    Patient Active Problem List   Diagnosis Date Noted  . MDD (major depressive disorder), recurrent severe, without psychosis (HCC) 03/11/2020  . Bicuspid aortic valve 12/05/2013  . Menstrual migraine 08/31/2013  . Menorrhagia 08/31/2013    Past Surgical History:  Procedure Laterality Date  . BIOPSY THYROID     benign     OB History    Gravida  0   Para  0   Term  0   Preterm  0   AB  0   Living  0     SAB  0   IAB  0   Ectopic  0   Multiple  0   Live Births              Family  History  Problem Relation Age of Onset  . Thyroid disease Mother        hypo  . Anxiety disorder Mother   . Depression Mother   . ADD / ADHD Mother   . Aneurysm Maternal Aunt   . Aneurysm Maternal Grandmother   . Anxiety disorder Father   . Depression Father   . OCD Father     Social History   Tobacco Use  . Smoking status: Current Some Day Smoker    Types: E-cigarettes  . Smokeless tobacco: Never Used  Vaping Use  . Vaping Use: Some days  Substance Use Topics  . Alcohol use: Yes    Alcohol/week: 1.0 standard drink    Types: 1 Standard drinks or equivalent per week  . Drug use: Not Currently    Home Medications Prior to Admission medications   Medication Sig Start Date End Date Taking? Authorizing Provider  doxycycline (VIBRAMYCIN) 100 MG capsule Take 1 capsule (100 mg total) by mouth 2 (two) times daily. 04/09/21  Yes Patches Mcdonnell, Ambrose Finland, MD  ibuprofen (ADVIL) 800 MG tablet Take 1 tablet (800 mg  total) by mouth every 8 (eight) hours as needed for up to 4 days for moderate pain or cramping. 04/09/21 04/13/21 Yes Noreene Boreman, Ambrose Finland, MD  amphetamine-dextroamphetamine (ADDERALL) 10 MG tablet Take 10 mg by mouth as needed. Patient not taking: Reported on 06/24/2020    [provider]  ARIPiprazole (ABILIFY) 2 MG tablet Take 1 tablet (2 mg total) by mouth daily. As an djunct to Effexor-XR: For depression 06/24/20   Arfeen, Phillips Grout, MD  levonorgestrel (MIRENA) 20 MCG/24HR IUD 1 each by Intrauterine route once.    [provider]  lisdexamfetamine (VYVANSE) 20 MG capsule Take 20 mg by mouth as needed.     [provider]  propranolol (INDERAL) 10 MG tablet Take 1 tablet (10 mg total) by mouth 2 (two) times daily. For anxiety 06/24/20   Arfeen, Phillips Grout, MD  traZODone (DESYREL) 50 MG tablet Take 1 tablet (50 mg total) by mouth at bedtime as needed for sleep. Patient not taking: Reported on 06/24/2020 03/14/20   Armandina Stammer I, NP  venlafaxine XR (EFFEXOR-XR)  37.5 MG 24 hr capsule Take one capsule daily. 10/21/20   Arfeen, Phillips Grout, MD    Allergies    Amoxicillin  Review of Systems   Review of Systems All other systems reviewed and are negative except that which was mentioned in HPI  Physical Exam Updated Vital Signs BP 98/61 (BP Location: Right Arm)   Pulse 71   Temp 98.2 F (36.8 C) (Oral)   Resp 17   Ht 5\' 8"  (1.727 m)   Wt 87.5 kg   LMP 03/20/2021   SpO2 100%   BMI 29.35 kg/m   Physical Exam Vitals and nursing note reviewed. Exam conducted with a chaperone present.  Constitutional:      General: She is not in acute distress.    Appearance: Normal appearance.  HENT:     Head: Normocephalic and atraumatic.  Eyes:     Conjunctiva/sclera: Conjunctivae normal.  Cardiovascular:     Rate and Rhythm: Normal rate and regular rhythm.     Heart sounds: Normal heart sounds. No murmur heard.   Pulmonary:     Effort: Pulmonary effort is normal.     Breath sounds: Normal breath sounds.  Abdominal:     General: Abdomen is flat. Bowel sounds are normal. There is no distension.     Palpations: Abdomen is soft.     Tenderness: There is abdominal tenderness in the right lower quadrant and suprapubic area. There is no guarding or rebound. Negative signs include Murphy's sign.  Genitourinary:    General: Normal vulva.     Vagina: Vaginal discharge present.     Cervix: Cervical motion tenderness present.     Uterus: Normal.      Adnexa:        Right: Tenderness present.      Comments: Moderate white vaginal discharge, mild CMT, no IUD strings visible Musculoskeletal:     Right lower leg: No edema.     Left lower leg: No edema.  Skin:    General: Skin is warm and dry.  Neurological:     Mental Status: She is alert and oriented to person, place, and time.     Comments: fluent  Psychiatric:        Mood and Affect: Mood normal.        Behavior: Behavior normal.     ED Results / Procedures / Treatments   Labs (all labs ordered  are listed, but only  abnormal results are displayed) Labs Reviewed  WET PREP, GENITAL - Abnormal; Notable for the following components:      Result Value   WBC, Wet Prep HPF POC MODERATE (*)    All other components within normal limits  COMPREHENSIVE METABOLIC PANEL - Abnormal; Notable for the following components:   Total Bilirubin <0.1 (*)    All other components within normal limits  LIPASE, BLOOD  CBC WITH DIFFERENTIAL/PLATELET  URINALYSIS, ROUTINE W REFLEX MICROSCOPIC  PREGNANCY, URINE  RPR  GC/CHLAMYDIA PROBE AMP ([place]) NOT AT [facility]    EKG None  Radiology US PELVIC COMPLETE W TRANSVAGINAL AND TORSION R/O  Result Date: 04/09/2021 CLINICAL DATA:  Pelvic pain EXAM: TRANSABDOMINAL AND TRANSVAGINAL ULTRASOUND OF PELVIS DOPPLER ULTRASOUND OF OVARIES TECHNIQUE: Both transabdominal and transvaginal ultrasound examinations of the pelvis were performed. Transabdominal technique was performed for global imaging of the pelvis including uterus, ovaries, adnexal regions, and pelvic cul-de-sac. It was necessary to proceed with endovaginal exam following the transabdominal exam to visualize the uterus, endometrium, and ovaries. Color and duplex Doppler ultrasound was utilized to evaluate blood flow to the ovaries. COMPARISON:  Ultrasound 07/12/2017 FINDINGS: Uterus Measurements: 5.8 x 3.3 x 4.1 cm = volume: 41.9 mL. Retroverted uterus. No focal myometrial abnormality or discernible uterine fibroids. Endometrium Thickness: 6 mm. No focal abnormality visualized. Echogenic, shadowing IUD in place. Right ovary Measurements: 4.6 x 3.9 x 4.0 cm = volume: 37.7 mL. 3.8 x 3.0 x 3.4 cm cyst with a reticular pattern of internal echoes and several solid-appearing regions with concave margins and no color Doppler internal flow. Characteristics compatible with hemorrhagic cyst. No concerning lesion. Left ovary Measurements: 2.6 x 1.9 x 1.8 cm = volume: 4.7 mL. Normal appearance/no adnexal mass. Pulsed Doppler  evaluation of both ovaries demonstrates normal low-resistance arterial and venous waveforms. Other findings Small volume of anechoic free fluid seen in the posterior cul-de-sac, nonspecific and often physiologic in a reproductive age female. IMPRESSION: 1. 3.8 cm probable hemorrhagic cyst in the right ovary. This has benign characteristics and is a common finding in premenopausal females. No imaging follow up is required. This follows consensus guidelines: Simple Adnexal Cysts: SRU Consensus Conference Update on Follow-up and Reporting. IUD in place. 2. Small volume of anechoic free fluid in the pelvis, nonspecific and often physiologic in a reproductive age female. 3. Retroverted uterus.  Normally positioned IUD. Electronically Signed   By: Kreg Shropshire M.D.   On: 04/09/2021 19:26    Procedures Procedures   Medications Ordered in ED Medications  cefTRIAXone (ROCEPHIN) 500 mg in dextrose 5 % 50 mL IVPB (has no administration in time range)    ED Course  I have reviewed the triage vital signs and the nursing notes.  Pertinent labs & imaging results that were available during my care of the patient were reviewed by me and considered in my medical decision making (see chart for details).    MDM Rules/Calculators/A&P                          Well appearing on exam, normal VS. Suprapubic and RLQ tenderness noted. DDx includes appendicitis, ovarian cyst, ovarian torsion, TOA/PID, IUD malpositioning, kidney stone. UA without evidence of infection or blood. CMP, CBC normal. UPT negative. Pelvic w/ some d/c and mild CMT. Recommended starting w/ pelvic US.   US shows R ovarian hemorrhagic cyst w/ small free fluid on pelvis. IUD in normal position. No evidence of torsion or TOA.  Given that her pain has waxed and waned, she has normal WBC count after 4 days of symptoms, and has not had any fever, vomiting, or anorexia, I feel that overall likelihood of appendicitis is low but I did discuss options  including proceeding with CT scan versus monitoring symptoms with strict return precautions.  Patient voiced understanding and preferred to forego CT at this time.  He understands reasons to return to the ED.  Because of her new vaginal discharge and presence of IUD, I did offer coverage for STDs including CTX and doxy. Instructed to follow-up with OB/GYN and she voiced understanding. Final Clinical Impression(s) / ED Diagnoses Final diagnoses:  Right ovarian cyst  Vaginal discharge    Rx / DC Orders ED Discharge Orders         Ordered    doxycycline (VIBRAMYCIN) 100 MG capsule  2 times daily        04/09/21 1957    ibuprofen (ADVIL) 800 MG tablet  Every 8 hours PRN        04/09/21 1957           Pallie Swigert, Ambrose Finlandachel Morgan, MD 04/09/21 2001

## 2021-04-09 NOTE — ED Triage Notes (Signed)
Abdominal pain started app 5 days ago. Denies any urinary symptoms. Last normal BM was five days ago.

## 2021-04-09 NOTE — ED Notes (Signed)
Pt in Ultrasound

## 2021-04-09 NOTE — ED Notes (Signed)
ED Provider at bedside. 

## 2021-04-09 NOTE — Discharge Instructions (Signed)
Please return to the ER immediately if your pain worsens or if you develop fever, vomiting, or lack of appetite.

## 2021-04-10 LAB — GC/CHLAMYDIA PROBE AMP (~~LOC~~) NOT AT ARMC
Chlamydia: NEGATIVE
Comment: NEGATIVE
Comment: NORMAL
Neisseria Gonorrhea: NEGATIVE

## 2021-04-10 LAB — RPR: RPR Ser Ql: NONREACTIVE

## 2021-04-21 ENCOUNTER — Ambulatory Visit (INDEPENDENT_AMBULATORY_CARE_PROVIDER_SITE_OTHER): Payer: BC Managed Care – PPO | Admitting: Psychiatry

## 2021-04-21 ENCOUNTER — Other Ambulatory Visit: Payer: Self-pay

## 2021-04-21 DIAGNOSIS — F330 Major depressive disorder, recurrent, mild: Secondary | ICD-10-CM | POA: Diagnosis not present

## 2021-04-22 ENCOUNTER — Encounter (HOSPITAL_COMMUNITY): Payer: Self-pay | Admitting: Psychiatry

## 2021-04-22 ENCOUNTER — Other Ambulatory Visit: Payer: Self-pay

## 2021-04-22 ENCOUNTER — Ambulatory Visit (INDEPENDENT_AMBULATORY_CARE_PROVIDER_SITE_OTHER): Payer: BC Managed Care – PPO | Admitting: Psychiatry

## 2021-04-22 DIAGNOSIS — F411 Generalized anxiety disorder: Secondary | ICD-10-CM

## 2021-04-22 DIAGNOSIS — F330 Major depressive disorder, recurrent, mild: Secondary | ICD-10-CM

## 2021-04-22 NOTE — Progress Notes (Signed)
Virtual Visit via Video Note  I connected with Amanda Fowler on 04/22/21 at  2:30 PM EDT by a video enabled telemedicine application and verified that I am speaking with the correct person using two identifiers.  Location: Patient: Patient Home Provider: Home Office  I discussed the limitations of evaluation and management by telemedicine and the availability of in person appointments. The patient expressed understanding and agreed to proceed.  History of Present Illness: MDD and GAD  Treatment Plan Goals: 1) To stabilize MH in order to make an informed decision about best plan to complete college degree. 2) To take medications as prescribed on a consistent bases, from missing 3-4 days a week to missing only one dose a week. 3) To improve self-care routine to decrease environmental stressors and improve hygiene, as evidenced by eating well, cleaning up room weekly, engaging in enjoyable activities and attending needed appointments.   Observations/Objective: Counselor met with Client for individual therapy via Webex. Counselor assessed MH symptoms and progress on treatment plan goals, with patient reportingthat she is noticing positive changes in how she chooses to think about and address issues, reducing overall anxiety related to common stressors.Client presents withmilddepression and moderateanxiety. Client denied suicidal ideation or self-harm behaviors.   Goal 1: Counselor used MI interventions to assess progress on Client's perspective on MH stabilization and motivation for completing her degree or pursuing work within her field of interest.Client reports feeling relieved and confident that she will be able to finish semester on a positive note. Client delightfully shared updates on assignments she was previously distressed about receiving high scores/grades. Client shared updates on her plans in pursuing summer activities and opportunities related to her field of  interest. Client reports feeling more hopeful about future plans.    Goal 2: Counselor assess medication compliance.Client reports taking all doses of medicationsas prescribed, 7 out of 7 days.Client reports no current issues with medications and understands the importance of compliance with overall functioning. Client reports feeling proud of self for managing medications so well during finals, with positive results.   Goal 3:Counselor assessed application of self-care practices and overall stress levels.Client discussed new strategies applied to reduce overspending on self-care items. Client shared about upcoming trips planned once semester is complete where she plans to engage in more self-care time and practices. Counselor praised Client for following through with intentions from past sessions.  Assessment and Plan: Counselor will continue to meet with patient to address treatment plan goals. Patient will continue to follow recommendations of providers and implement skills learned in session.  Follow Up Instructions: Counselor will send information for next session via Webex.   The patient was advised to call back or seek an in-person evaluation if the symptoms worsen or if the condition fails to improve as anticipated.  I provided25minutes of non-face-to-face time during this encounter.   Lise Auer, LCSW

## 2021-04-22 NOTE — Progress Notes (Signed)
Virtual Visit via Video Note  I connected with Amanda Fowler on 04/21/21 at 12:00 PM EDT by a video enabled telemedicine application and verified that I am speaking with the correct person using two identifiers.  I discussed the limitations of evaluation and management by telemedicine and the availability of in person appointments. The patient expressed understanding and agreed to proceed.    Location: Patient: Patient Home Provider: Home Office   GROUP GOAL: Client will attend IOP Aftercare Group Therapy 2-4x a month to connect with peers and to apply strategies discussed to improve mental health condition.  History of Present Illness: MDD  Observations/Objective: Counselor met with Patient in the context of Group Therapy, via Webex. Counselor prompted Patient to share updates on coping skill application and individual therapeutic process in management of mental health. Client reports intentional efforts to maintain mental health.  Counselor prompted Patient to share areas of concern, challenges and identify barriers to meeting current goals. Topics covered: potential treatments and engagement, respite, "adulting", stress management, employment issues, improvement in relationships through behavior change, and managing mental health through litigation trials. Patient engaged in discussion, provided feedback for others within the group and took note of additional strategies reviewed and discussed.   Assessment and Plan: Counselor recommends client continue following treatment plan goals, following crisis plan and following up with behavioral health and medical providers as needed. Patient is welcome to join group at next session.   Follow Up Instructions: Counselor to provide link for next session via Webex platform. The patient was advised to call back or seek an in-person evaluation if the symptoms worsen or if the condition fails to improve as anticipated.  I provided 70 minutes of  non-face-to-face time during this encounter.   Lise Auer, LCSW

## 2021-04-23 ENCOUNTER — Encounter (HOSPITAL_COMMUNITY): Payer: Self-pay | Admitting: Psychiatry

## 2021-04-28 ENCOUNTER — Other Ambulatory Visit: Payer: Self-pay

## 2021-04-28 ENCOUNTER — Encounter (HOSPITAL_COMMUNITY): Payer: Self-pay | Admitting: Psychiatry

## 2021-04-28 ENCOUNTER — Ambulatory Visit (INDEPENDENT_AMBULATORY_CARE_PROVIDER_SITE_OTHER): Payer: BC Managed Care – PPO | Admitting: Psychiatry

## 2021-04-28 DIAGNOSIS — F330 Major depressive disorder, recurrent, mild: Secondary | ICD-10-CM | POA: Diagnosis not present

## 2021-04-28 NOTE — Progress Notes (Signed)
Virtual Visit via Video Note  I connected with Amanda Fowler on 04/28/21 at 12:00 PM EDT by a video enabled telemedicine application and verified that I am speaking with the correct person using two identifiers.  I discussed the limitations of evaluation and management by telemedicine and the availability of in person appointments. The patient expressed understanding and agreed to proceed.    Location: Patient: Patient Home Provider: Home Office   GROUP GOAL: Client will attend IOP Aftercare Group Therapy 2-4x a month to connect with peers and to apply strategies discussed to improve mental health condition.  History of Present Illness: MDD  Observations/Objective: Counselor met with Patient in the context of Group Therapy, via Webex. Counselor prompted Patient to share updates on coping skill application and individual therapeutic process in management of mental health. Client reports intentional efforts to maintain mental health.  Counselor prompted Patient to share areas of concern, challenges and identify barriers to meeting current goals. Topics covered: avoidance, cognitive distortions, anxiety coping strategies, work related stressors, mental health triggers by family, and how physical health impact medical health. Patient engaged in discussion, provided feedback for others within the group and took note of additional strategies reviewed and discussed.   Assessment and Plan: Counselor recommends client continue following treatment plan goals, following crisis plan and following up with behavioral health and medical providers as needed. Patient is welcome to join group at next session.   Follow Up Instructions: Counselor to provide link for next session via Webex platform. The patient was advised to call back or seek an in-person evaluation if the symptoms worsen or if the condition fails to improve as anticipated.  I provided 90 minutes of non-face-to-face time during this  encounter.   Lise Auer, LCSW

## 2021-05-05 ENCOUNTER — Other Ambulatory Visit: Payer: Self-pay

## 2021-05-05 ENCOUNTER — Encounter (HOSPITAL_COMMUNITY): Payer: Self-pay | Admitting: Psychiatry

## 2021-05-05 ENCOUNTER — Ambulatory Visit (INDEPENDENT_AMBULATORY_CARE_PROVIDER_SITE_OTHER): Payer: BC Managed Care – PPO | Admitting: Psychiatry

## 2021-05-05 DIAGNOSIS — F411 Generalized anxiety disorder: Secondary | ICD-10-CM | POA: Diagnosis not present

## 2021-05-05 DIAGNOSIS — F330 Major depressive disorder, recurrent, mild: Secondary | ICD-10-CM

## 2021-05-05 NOTE — Progress Notes (Signed)
Virtual Visit via Video Note  I connected with Amanda Fowler on 05/05/21 at 12:00 PM EDT by a video enabled telemedicine application and verified that I am speaking with the correct person using two identifiers.  I discussed the limitations of evaluation and management by telemedicine and the availability of in person appointments. The patient expressed understanding and agreed to proceed.    Location: Patient: Patient Home Provider: Home Office   GROUP GOAL: Client will attend IOP Aftercare Group Therapy 2-4x a month to connect with peers and to apply strategies discussed to improve mental health condition.  History of Present Illness: MDD and GAD  Observations/Objective: Counselor met with Patient in the context of Group Therapy, via Webex. Counselor prompted Patient to share updates on coping skill application and individual therapeutic process in management of mental health. Client reports intentional efforts to maintain mental health.  Counselor prompted Patient to share areas of concern, challenges and identify barriers to meeting current goals. Topics covered: procrastination, navigating mixed feelings, updates on Mother's Day celebrations/grief, reestablishing care, family issues, PMDD, and stress of moving. Patient engaged in discussion, provided feedback for others within the group and took note of additional strategies reviewed and discussed.   Assessment and Plan: Counselor recommends client continue following treatment plan goals, following crisis plan and following up with behavioral health and medical providers as needed. Patient is welcome to join group at next session.   Follow Up Instructions: Counselor to provide link for next session via Webex platform. The patient was advised to call back or seek an in-person evaluation if the symptoms worsen or if the condition fails to improve as anticipated.  I provided 90 minutes of non-face-to-face time during this  encounter.   Lise Auer, LCSW

## 2021-05-12 ENCOUNTER — Ambulatory Visit (INDEPENDENT_AMBULATORY_CARE_PROVIDER_SITE_OTHER): Payer: BC Managed Care – PPO | Admitting: Psychiatry

## 2021-05-12 DIAGNOSIS — F330 Major depressive disorder, recurrent, mild: Secondary | ICD-10-CM

## 2021-05-13 ENCOUNTER — Other Ambulatory Visit: Payer: Self-pay

## 2021-05-15 ENCOUNTER — Encounter (HOSPITAL_COMMUNITY): Payer: Self-pay | Admitting: Psychiatry

## 2021-05-15 NOTE — Progress Notes (Signed)
Virtual Visit via Video Note  I connected with Amanda Fowler on 05/12/21 at 12:00 PM EDT by a video enabled telemedicine application and verified that I am speaking with the correct person using two identifiers.  I discussed the limitations of evaluation and management by telemedicine and the availability of in person appointments. The patient expressed understanding and agreed to proceed.    Location: Patient: Patient Home Provider: Home Office   GROUP GOAL: Client will attend IOP Aftercare Group Therapy 2-4x a month to connect with peers and to apply strategies discussed to improve mental health condition.  History of Present Illness: Bipolar 1 DO  Observations/Objective: Counselor met with Patient in the context of Group Therapy, via Webex. Counselor prompted Patient to share updates on coping skill application and individual therapeutic process in management of mental health. Client reports intentional efforts to maintain mental health.  Counselor prompted Patient to share areas of concern, challenges and identify barriers to meeting current goals. Topics covered: self-advocacy, medication management, historical racial trauma, mental health and violence, myth of MH and boundaries. Patient engaged in discussion, provided feedback for others within the group and took note of additional strategies reviewed and discussed.   Assessment and Plan: Counselor recommends client continue following treatment plan goals, following crisis plan and following up with behavioral health and medical providers as needed. Patient is welcome to join group at next session.   Follow Up Instructions: Counselor to provide link for next session via Webex platform. The patient was advised to call back or seek an in-person evaluation if the symptoms worsen or if the condition fails to improve as anticipated.  I provided 75 minutes of non-face-to-face time during this encounter.   Lise Auer, LCSW

## 2021-05-18 ENCOUNTER — Other Ambulatory Visit: Payer: Self-pay

## 2021-05-18 ENCOUNTER — Encounter (HOSPITAL_COMMUNITY): Payer: Self-pay | Admitting: Psychiatry

## 2021-05-18 ENCOUNTER — Ambulatory Visit (HOSPITAL_COMMUNITY): Payer: BC Managed Care – PPO | Admitting: Psychiatry

## 2021-05-18 DIAGNOSIS — F330 Major depressive disorder, recurrent, mild: Secondary | ICD-10-CM

## 2021-05-18 NOTE — Progress Notes (Signed)
Counselor called, left vm and waited on webex for Client for individual session. Client to joing group therapy tomorrow. If not present, Counselor will attempt to contact again to ensure safety.  Hilbert Odor, LCSW

## 2021-05-19 ENCOUNTER — Other Ambulatory Visit: Payer: Self-pay

## 2021-05-19 ENCOUNTER — Ambulatory Visit (HOSPITAL_COMMUNITY): Payer: BC Managed Care – PPO | Admitting: Psychiatry

## 2021-06-02 ENCOUNTER — Other Ambulatory Visit: Payer: Self-pay

## 2021-06-02 ENCOUNTER — Ambulatory Visit (INDEPENDENT_AMBULATORY_CARE_PROVIDER_SITE_OTHER): Payer: BC Managed Care – PPO | Admitting: Psychiatry

## 2021-06-02 ENCOUNTER — Encounter (HOSPITAL_COMMUNITY): Payer: Self-pay | Admitting: Psychiatry

## 2021-06-02 DIAGNOSIS — F411 Generalized anxiety disorder: Secondary | ICD-10-CM | POA: Diagnosis not present

## 2021-06-02 DIAGNOSIS — F330 Major depressive disorder, recurrent, mild: Secondary | ICD-10-CM

## 2021-06-02 NOTE — Progress Notes (Signed)
Virtual Visit via Video Note  I connected with Amanda Fowler on 06/02/21 at 12:00 PM EDT by a video enabled telemedicine application and verified that I am speaking with the correct person using two identifiers.  I discussed the limitations of evaluation and management by telemedicine and the availability of in person appointments. The patient expressed understanding and agreed to proceed.    Location: Patient: Patient Home Provider: Home Office   GROUP GOAL: Client will attend IOP Aftercare Group Therapy 2-4x a month to connect with peers and to apply strategies discussed to improve mental health condition.  History of Present Illness: MDD and GAD  Observations/Objective: Counselor met with Patient in the context of Group Therapy, via Webex. Counselor prompted Patient to share updates on coping skill application and individual therapeutic process in management of mental health. Client reports intentional efforts to maintain mental health.  Counselor prompted Patient to share areas of concern, challenges and identify barriers to meeting current goals. Topics covered: medication changes/issues, worthlessness, physical activity, strategies for combating overspending, grief and loss, and life transitions.  Patient engaged in discussion, provided feedback for others within the group and took note of additional strategies reviewed and discussed.   Assessment and Plan: Counselor recommends client continue following treatment plan goals, following crisis plan and following up with behavioral health and medical providers as needed. Patient is welcome to join group at next session.   Follow Up Instructions: Counselor to provide link for next session via Webex platform. The patient was advised to call back or seek an in-person evaluation if the symptoms worsen or if the condition fails to improve as anticipated.  I provided 90 minutes of non-face-to-face time during this  encounter.   Lise Auer, LCSW

## 2021-06-09 ENCOUNTER — Other Ambulatory Visit: Payer: Self-pay

## 2021-06-09 ENCOUNTER — Encounter (HOSPITAL_COMMUNITY): Payer: Self-pay | Admitting: Psychiatry

## 2021-06-09 ENCOUNTER — Ambulatory Visit (INDEPENDENT_AMBULATORY_CARE_PROVIDER_SITE_OTHER): Payer: BC Managed Care – PPO | Admitting: Psychiatry

## 2021-06-09 DIAGNOSIS — F330 Major depressive disorder, recurrent, mild: Secondary | ICD-10-CM

## 2021-06-09 NOTE — Progress Notes (Signed)
Virtual Visit via Video Note  I connected with Amanda Fowler on 06/09/21 at 12:00 PM EDT by a video enabled telemedicine application and verified that I am speaking with the correct person using two identifiers.  I discussed the limitations of evaluation and management by telemedicine and the availability of in person appointments. The patient expressed understanding and agreed to proceed.    Location: Patient: Patient Home Provider: Home Office   GROUP GOAL: Client will attend IOP Aftercare Group Therapy 2-4x a month to connect with peers and to apply strategies discussed to improve mental health condition.  History of Present Illness: MDD  Observations/Objective: Counselor met with Patient in the context of Group Therapy, via Webex. Counselor prompted Patient to share updates on coping skill application and individual therapeutic process in management of mental health. Client reports intentional efforts to maintain mental health.  Counselor prompted Patient to share areas of concern, challenges and identify barriers to meeting current goals. Topics covered: declining ADLs, isolating, boundary violations, assertive communication, coping with grief reminders and trauma triggers, body image issues, sleep concerns, issues and with providers.  Patient engaged in discussion, provided feedback for others within the group and took note of additional strategies reviewed and discussed.   Assessment and Plan: Counselor recommends client continue following treatment plan goals, following crisis plan and following up with behavioral health and medical providers as needed. Patient is welcome to join group at next session.   Follow Up Instructions: Counselor to provide link for next session via Webex platform. The patient was advised to call back or seek an in-person evaluation if the symptoms worsen or if the condition fails to improve as anticipated.  I provided 85 minutes of non-face-to-face  time during this encounter.   Lise Auer, LCSW

## 2021-06-12 IMAGING — US US PELVIS COMPLETE TRANSABD/TRANSVAG W DUPLEX
1 series · 13 of 25 positions shown · non-contrast
Comparison: Ultrasound 07/12/2017

CLINICAL DATA: Pelvic pain

EXAM:
TRANSABDOMINAL AND TRANSVAGINAL ULTRASOUND OF PELVIS
DOPPLER ULTRASOUND OF OVARIES
TECHNIQUE: Both transabdominal and transvaginal ultrasound examinations of the
pelvis were performed. Transabdominal technique was performed for
global imaging of the pelvis including uterus, ovaries, adnexal
regions, and pelvic cul-de-sac.
It was necessary to proceed with endovaginal exam following the
transabdominal exam to visualize the uterus, endometrium, and
ovaries. Color and duplex Doppler ultrasound was utilized to
evaluate blood flow to the ovaries.

[Series 1: us pelvis complete transabd/transvag w duplex · 13 of 61 slices shown]
[im 1/61]
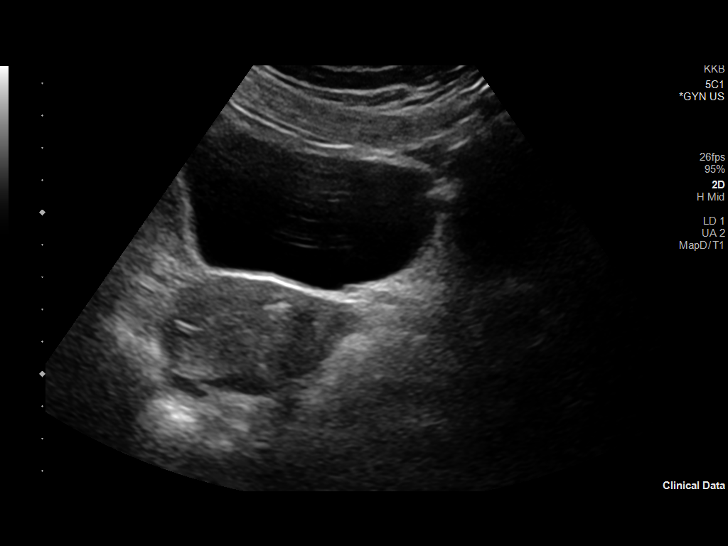
[im 6/61]
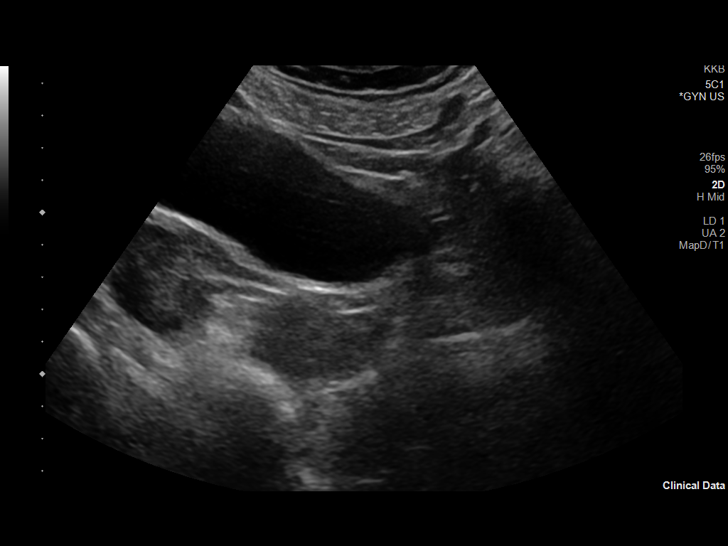
[im 11/61]
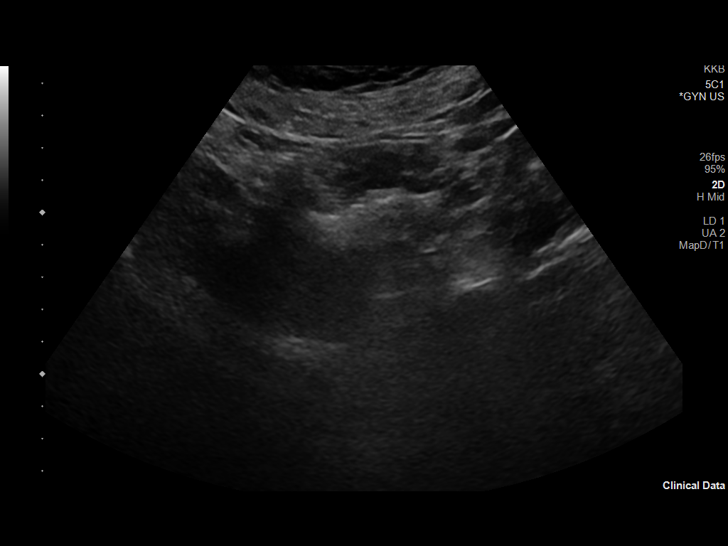
[im 16/61]
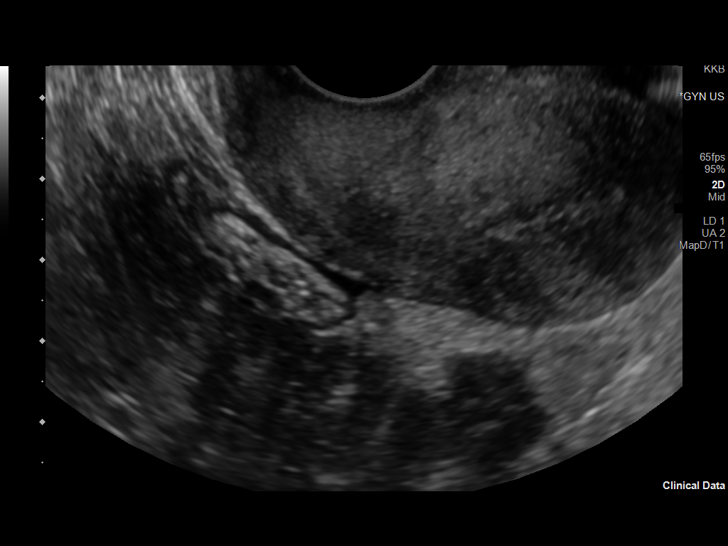
[im 21/61]
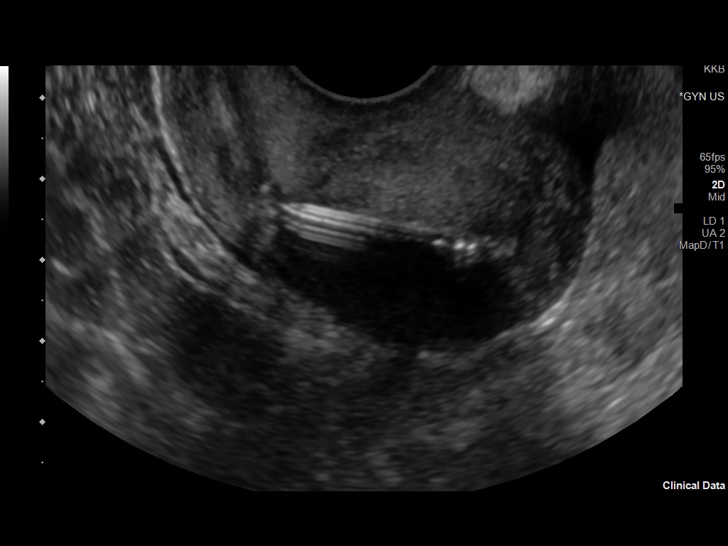
[im 26/61]
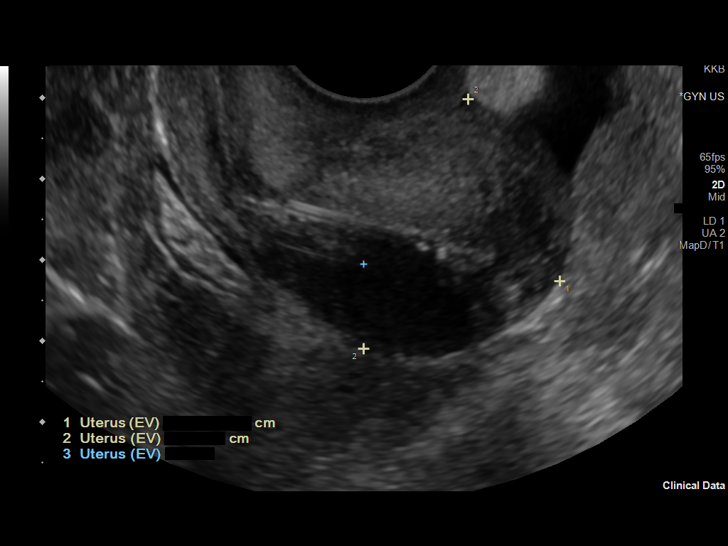
[im 31/61]
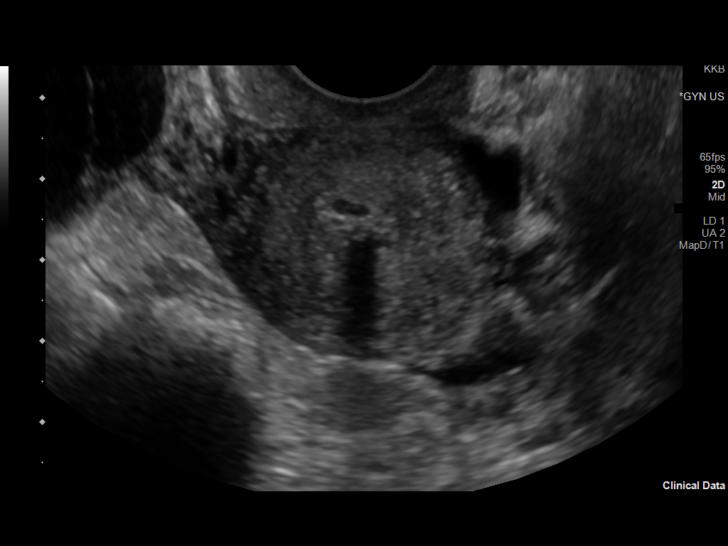
[im 36/61]
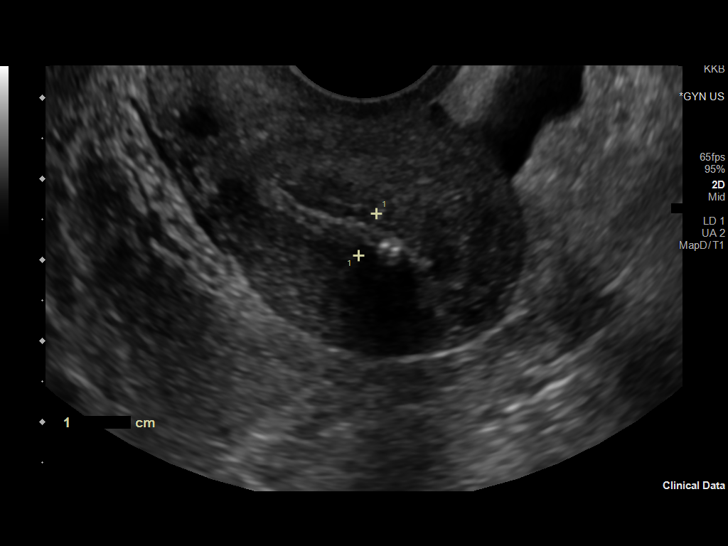
[im 41/61]
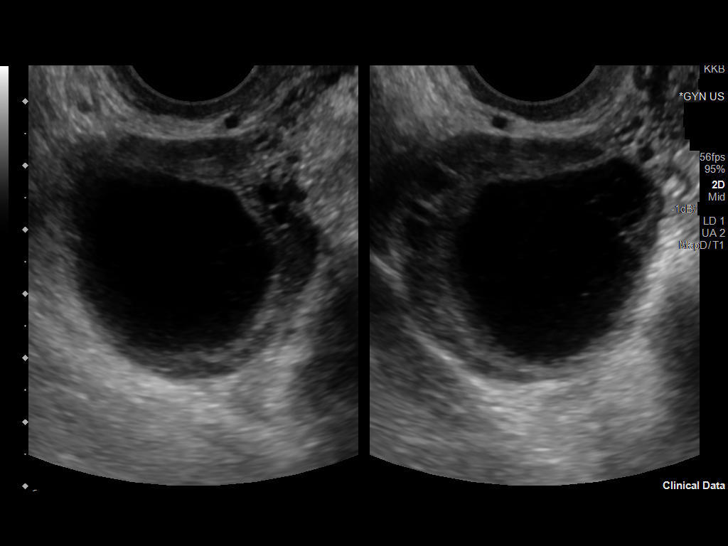
[im 46/61]
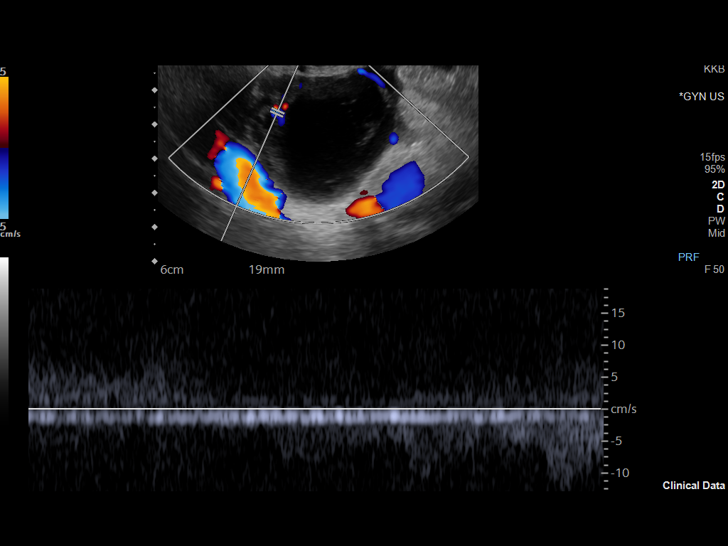
[im 51/61]
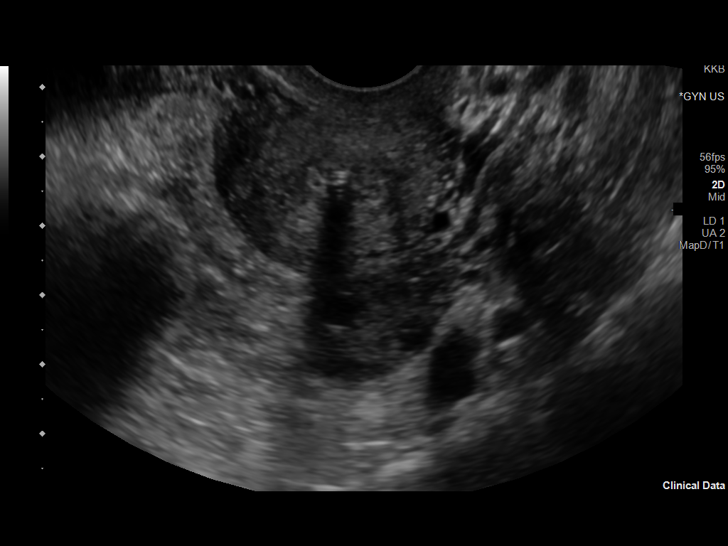
[im 56/61]
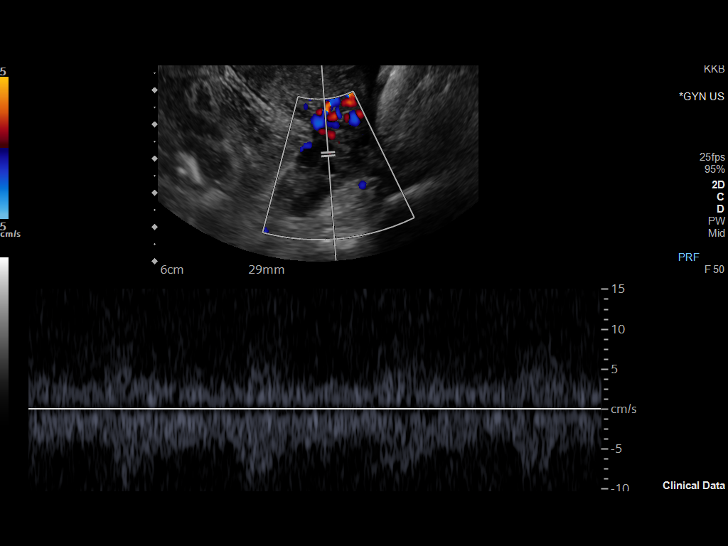
[im 61/61]
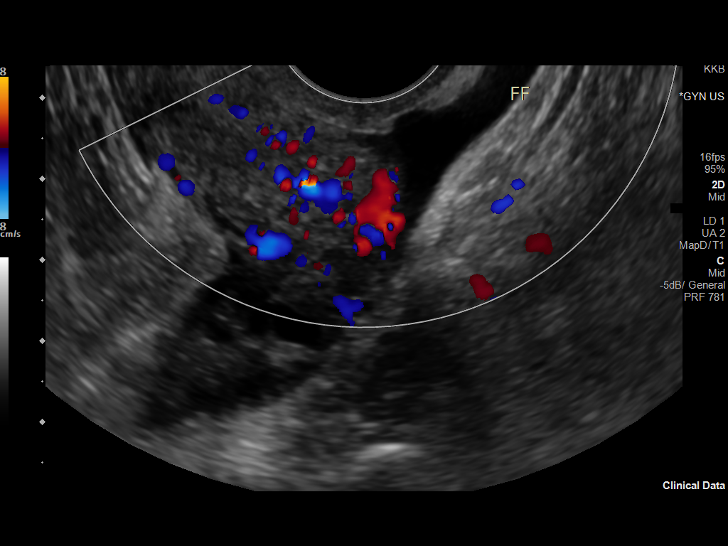

[13 of 25 positions shown; findings below may reference images not displayed]

FINDINGS: Uterus

Measurements: 5.8 x 3.3 x 4.1 cm = volume: 41.9 mL. Retroverted
uterus. No focal myometrial abnormality or discernible uterine
fibroids.

Endometrium

Thickness: 6 mm. No focal abnormality visualized. Echogenic,
shadowing IUD in place.

Right ovary

Measurements: 4.6 x 3.9 x 4.0 cm = volume: 37.7 mL. 3.8 x 3.0 x
cm cyst with a reticular pattern of internal echoes and several
solid-appearing regions with concave margins and no color Doppler
internal flow. Characteristics compatible with hemorrhagic cyst. No
concerning lesion.

Left ovary

Measurements: 2.6 x 1.9 x 1.8 cm = volume: 4.7 mL. Normal
appearance/no adnexal mass.

Pulsed Doppler evaluation of both ovaries demonstrates normal
low-resistance arterial and venous waveforms.

Other findings

Small volume of anechoic free fluid seen in the posterior
cul-de-sac, nonspecific and often physiologic in a reproductive age
female.
IMPRESSION: 1. 3.8 cm probable hemorrhagic cyst in the right ovary. This has
benign characteristics and is a common finding in premenopausal
females. No imaging follow up is required. This follows consensus
guidelines: Simple Adnexal Cysts: SRU Consensus Conference Update on
Follow-up and Reporting. IUD in place.
2. Small volume of anechoic free fluid in the pelvis, nonspecific
and often physiologic in a reproductive age female.
3. Retroverted uterus.  Normally positioned IUD.

## 2021-06-16 ENCOUNTER — Ambulatory Visit (INDEPENDENT_AMBULATORY_CARE_PROVIDER_SITE_OTHER): Payer: BC Managed Care – PPO | Admitting: Psychiatry

## 2021-06-16 ENCOUNTER — Other Ambulatory Visit: Payer: Self-pay

## 2021-06-16 DIAGNOSIS — F330 Major depressive disorder, recurrent, mild: Secondary | ICD-10-CM

## 2021-06-17 ENCOUNTER — Encounter (HOSPITAL_COMMUNITY): Payer: Self-pay | Admitting: Psychiatry

## 2021-06-17 NOTE — Progress Notes (Signed)
Virtual Visit via Video Note  I connected with Amanda Fowler on 06/17/21 at 12:00 PM EDT by a video enabled telemedicine application and verified that I am speaking with the correct person using two identifiers.  I discussed the limitations of evaluation and management by telemedicine and the availability of in person appointments. The patient expressed understanding and agreed to proceed.    Location: Patient: Patient Home Provider: Home Office   GROUP GOAL: Client will attend IOP Aftercare Group Therapy 2-4x a month to connect with peers and to apply strategies discussed to improve mental health condition.  History of Present Illness: MDD   Observations/Objective: Counselor met with Patient in the context of Group Therapy, via Webex. Counselor prompted Patient to share updates on coping skill application and individual therapeutic process in management of mental health. Client reports intentional efforts to maintain mental health.  Counselor prompted Patient to share areas of concern, challenges and identify barriers to meeting current goals. Topics covered: low energy and motivation, being intentional with time and schedule, father's day grief reminders, expanding support system, trying new activities in the community, medication side effects, Disability Process, health related issues, medical trauma, and self-care practices.  Patient engaged in discussion, provided feedback for others within the group and took note of additional strategies reviewed and discussed.   Assessment and Plan: Counselor recommends client continue following treatment plan goals, following crisis plan and following up with behavioral health and medical providers as needed. Patient is welcome to join group at next session.   Follow Up Instructions: Counselor to provide link for next session via Webex platform. The patient was advised to call back or seek an in-person evaluation if the symptoms worsen or if  the condition fails to improve as anticipated.  I provided 85 minutes of non-face-to-face time during this encounter.   Lise Auer, LCSW

## 2021-06-18 ENCOUNTER — Ambulatory Visit (INDEPENDENT_AMBULATORY_CARE_PROVIDER_SITE_OTHER): Payer: BC Managed Care – PPO | Admitting: Psychiatry

## 2021-06-18 ENCOUNTER — Other Ambulatory Visit: Payer: Self-pay

## 2021-06-18 DIAGNOSIS — F330 Major depressive disorder, recurrent, mild: Secondary | ICD-10-CM

## 2021-06-18 DIAGNOSIS — F329 Major depressive disorder, single episode, unspecified: Secondary | ICD-10-CM | POA: Diagnosis not present

## 2021-06-18 DIAGNOSIS — F411 Generalized anxiety disorder: Secondary | ICD-10-CM | POA: Diagnosis not present

## 2021-06-18 NOTE — Progress Notes (Signed)
Virtual Visit via Video Note  I connected with [name] Ankney on 06/18/21 at  2:30 PM EDT by a video enabled telemedicine application and verified that I am speaking with the correct person using two identifiers.  Location: Patient: Patient Home Provider: Home Office   I discussed the limitations of evaluation and management by telemedicine and the availability of in person appointments. The patient expressed understanding and agreed to proceed.   History of Present Illness: MDD and GAD   Treatment Plan Goals: 1) To improve self-care routine to decrease environmental stressors and improve hygiene, as evidenced by eating well, cleaning up room weekly, engaging in enjoyable activities and attending needed appointments.  2) Client will take medication as prescribed 7 out of 7 days and will communicate side effects with provider as needed.  3) Client will attend IOP Aftercare Group Therapy 2-4x a month to connect with peers and to apply strategies discussed to improve mental health condition.   Observations/Objective: Counselor met with Client for individual therapy via Webex. Counselor assessed MH symptoms and progress on treatment plan goals, with patient reporting that she is struggling with ADLs, however she has made plans for self-care and to spend time socializing today out of the home. Client reports increased depressive symptoms. Client deciding on how she would like to move forward with medication management to reduce depressive symptoms. Client reports lack of motivation, fatigue, boredom, and feeling awkward in social settings. Counselor provided psychoeducation on applicable coping strategies and skills for concern areas. Counselor and Client spent remainder of time updating, revising and adding to annual CCA and treatment plan update. Client shred feedback on progress and needs. Client plans to continue engagement in individual and group treatment. Client presents with moderate  depression and moderate anxiety. Client denied suicidal ideation or self-harm behaviors.   Assessment and Plan: Counselor will continue to meet with patient to address treatment plan goals. Patient will continue to follow recommendations of providers and implement skills learned in session.   Follow Up Instructions: Counselor will send information for next session via Webex.   The patient was advised to call back or seek an in-person evaluation if the symptoms worsen or if the condition fails to improve as anticipated.   I provided 60 minutes of non-face-to-face time during this encounter.     Lise Auer, LCSW

## 2021-06-19 ENCOUNTER — Encounter (HOSPITAL_COMMUNITY): Payer: Self-pay | Admitting: Psychiatry

## 2021-06-23 ENCOUNTER — Other Ambulatory Visit: Payer: Self-pay

## 2021-06-23 ENCOUNTER — Ambulatory Visit (INDEPENDENT_AMBULATORY_CARE_PROVIDER_SITE_OTHER): Payer: BC Managed Care – PPO | Admitting: Psychiatry

## 2021-06-23 ENCOUNTER — Encounter (HOSPITAL_COMMUNITY): Payer: Self-pay | Admitting: Psychiatry

## 2021-06-23 DIAGNOSIS — F330 Major depressive disorder, recurrent, mild: Secondary | ICD-10-CM

## 2021-06-23 NOTE — Progress Notes (Signed)
Virtual Visit via Video Note  I connected with Amanda Fowler on 06/23/21 at 12:00 PM EDT by a video enabled telemedicine application and verified that I am speaking with the correct person using two identifiers.  I discussed the limitations of evaluation and management by telemedicine and the availability of in person appointments. The patient expressed understanding and agreed to proceed.    Location: Patient: Patient Home Provider: Home Office   GROUP GOAL: Client will attend IOP Aftercare Group Therapy 2-4x a month to connect with peers and to apply strategies discussed to improve mental health condition.  History of Present Illness: MDD  Observations/Objective: Counselor met with Patient in the context of Group Therapy, via Webex. Counselor prompted Patient to share updates on coping skill application and individual therapeutic process in management of mental health. Client reports intentional efforts to maintain mental health.  Counselor prompted Patient to share areas of concern, challenges and identify barriers to meeting current goals. Group topics covered: current events impact on MH, medical issues impacting mental health, substance abstinence maintenance, childhood traumas, increased socialization, intimate partner communication, medication concerns, and grief and loss.  Patient engaged in discussion, provided feedback for others within the group and took note of additional strategies reviewed and discussed.   Assessment and Plan: Counselor recommends client continue following treatment plan goals, following crisis plan and following up with behavioral health and medical providers as needed. Patient is welcome to join group at next session.   Follow Up Instructions: Counselor to provide link for next session via Webex platform. The patient was advised to call back or seek an in-person evaluation if the symptoms worsen or if the condition fails to improve as anticipated.  I  provided 90 minutes of non-face-to-face time during this encounter.   Lise Auer, LCSW

## 2021-06-25 ENCOUNTER — Encounter (HOSPITAL_COMMUNITY): Payer: Self-pay | Admitting: Psychiatry

## 2021-06-25 ENCOUNTER — Other Ambulatory Visit: Payer: Self-pay

## 2021-06-25 ENCOUNTER — Ambulatory Visit (INDEPENDENT_AMBULATORY_CARE_PROVIDER_SITE_OTHER): Payer: BC Managed Care – PPO | Admitting: Psychiatry

## 2021-06-25 DIAGNOSIS — F411 Generalized anxiety disorder: Secondary | ICD-10-CM

## 2021-06-25 DIAGNOSIS — F330 Major depressive disorder, recurrent, mild: Secondary | ICD-10-CM | POA: Diagnosis not present

## 2021-06-25 NOTE — Progress Notes (Signed)
Virtual Visit via Video Note  I connected with Amanda Fowler on 06/25/21 at  2:30 PM EDT by a video enabled telemedicine application and verified that I am speaking with the correct person using two identifiers.  History of Present Illness: MDD and GAD   Treatment Plan Goals: 1) To improve self-care routine to decrease environmental stressors and improve hygiene, as evidenced by eating well, cleaning up room weekly, engaging in enjoyable activities and attending needed appointments.  2) Client will take medication as prescribed 7 out of 7 days and will communicate side effects with provider as needed. 3) Client will attend IOP Aftercare Group Therapy 2-4x a month to connect with peers and to apply strategies discussed to improve mental health condition.   Observations/Objective: Counselor met with Client for individual therapy via Webex. Counselor assessed MH symptoms and progress on treatment plan goals, with patient reporting that depressive symptoms remain, lack of motivation, low energy, poor self-concept, relational and familial issues. Client presents with moderate depression and moderate anxiety. Client denied suicidal ideation or self-harm behaviors.  Goal 1-3) Counselor assessed progress on goals using CBT and MI interventions, as well as psychoeducation as needed for gaps in knowledge and understanding. Client reports that she continues to have low energy, lack of purpose and meaning in life, low motivation and discussed impact of 2 challenging relationships on mental health. Client reports she is eating on a limited budget, making food choices often unhealthy or lacking in nutrition. Client reports being unwilling and unable to keep living spaces clean and organized. Client finding self "stuck on the couch" having guilt for lack of productivity. Counselor and Client identified coping skills and practical strategies to structure day to make tasks more manageable. Client willing to  apply plan. Client requested processing of trauma trigger related to relationship with mother. Client reports need for medication changes/adjustments and the desire to navigate depression holeistically. Client attended group 3 out 4 sessions this month.    Assessment and Plan: Counselor will continue to meet with patient to address treatment plan goals. Patient will continue to follow recommendations of providers and implement skills learned in session.   Follow Up Instructions: Counselor will send information for next session via Webex.   The patient was advised to call back or seek an in-person evaluation if the symptoms worsen or if the condition fails to improve as anticipated.   I provided 60 minutes of non-face-to-face time during this encounter.     Lise Auer, LCSW

## 2021-06-30 ENCOUNTER — Other Ambulatory Visit: Payer: Self-pay

## 2021-06-30 ENCOUNTER — Encounter (HOSPITAL_COMMUNITY): Payer: Self-pay | Admitting: Psychiatry

## 2021-06-30 ENCOUNTER — Ambulatory Visit (HOSPITAL_COMMUNITY): Payer: BC Managed Care – PPO | Admitting: Psychiatry

## 2021-06-30 DIAGNOSIS — F330 Major depressive disorder, recurrent, mild: Secondary | ICD-10-CM

## 2021-06-30 NOTE — Progress Notes (Signed)
Client notified Counselor that she was unable to attend group due to another appointment today.   Othel Hoogendoorn. LCSW

## 2021-07-02 ENCOUNTER — Ambulatory Visit (INDEPENDENT_AMBULATORY_CARE_PROVIDER_SITE_OTHER): Payer: BC Managed Care – PPO | Admitting: Psychiatry

## 2021-07-02 ENCOUNTER — Other Ambulatory Visit: Payer: Self-pay

## 2021-07-02 DIAGNOSIS — F330 Major depressive disorder, recurrent, mild: Secondary | ICD-10-CM | POA: Diagnosis not present

## 2021-07-02 DIAGNOSIS — F411 Generalized anxiety disorder: Secondary | ICD-10-CM | POA: Diagnosis not present

## 2021-07-03 ENCOUNTER — Encounter (HOSPITAL_COMMUNITY): Payer: Self-pay | Admitting: Psychiatry

## 2021-07-03 NOTE — Progress Notes (Signed)
Virtual Visit via Video Note  I connected with Shalen Stlaurent on 07/03/21 at  2:30 PM EDT by a video enabled telemedicine application and verified that I am speaking with the correct person using two identifiers.  History of Present Illness: MDD and GAD   Treatment Plan Goals: 1) To improve self-care routine to decrease environmental stressors and improve hygiene, as evidenced by eating well, cleaning up room weekly, engaging in enjoyable activities and attending needed appointments.  2) Client will take medication as prescribed 7 out of 7 days and will communicate side effects with provider as needed. 3) Client will attend IOP Aftercare Group Therapy 2-4x a month to connect with peers and to apply strategies discussed to improve mental health condition.   Observations/Objective: Counselor met with Client for individual therapy via Webex. Counselor assessed MH symptoms and progress on treatment plan goals, with patient reporting that she was currently tired and relieved after being up most of the night tending to a pet medical emergency.  Client presents with mild depression and moderate anxiety. Client denied suicidal ideation or self-harm behaviors.   Goal 1-3) Counselor assessed progress on goals using CBT and MI interventions, as well as psychoeducation as needed for gaps in knowledge and understanding. Client reports improvement in overall functioning, ability to attend a beach trip with friends, a couple social events and improved communication with partner. 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 Counselor and Client identified coping skills and practical strategies that have assisted in regaining structure and functioning. Client requested processing of trauma trigger related to relationship with mother. Client and Counselor discussed including mother in future therapy sessions, pros, cons and process. Client to discuss with mother. Client reports need  for medication changes/adjustments and the desire to navigate depression holeistically. Client to set up appointment with provider in coming months to assess need for an antidepressant. Client attended group 1 out of 1 sessions this month.    Assessment and Plan: Counselor will continue to meet with patient to address treatment plan goals. Patient will continue to follow recommendations of providers and implement skills learned in session.   Follow Up Instructions: Counselor will send information for next session via Webex.   The patient was advised to call back or seek an in-person evaluation if the symptoms worsen or if the condition fails to improve as anticipated.   I provided 60 minutes of non-face-to-face time during this encounter.     Lise Auer, LCSW

## 2021-07-08 DIAGNOSIS — Z03818 Encounter for observation for suspected exposure to other biological agents ruled out: Secondary | ICD-10-CM | POA: Diagnosis not present

## 2021-07-08 DIAGNOSIS — R0981 Nasal congestion: Secondary | ICD-10-CM | POA: Diagnosis not present

## 2021-07-08 DIAGNOSIS — Z20822 Contact with and (suspected) exposure to covid-19: Secondary | ICD-10-CM | POA: Diagnosis not present

## 2021-07-08 DIAGNOSIS — J209 Acute bronchitis, unspecified: Secondary | ICD-10-CM | POA: Diagnosis not present

## 2021-07-08 DIAGNOSIS — R051 Acute cough: Secondary | ICD-10-CM | POA: Diagnosis not present

## 2021-07-13 ENCOUNTER — Ambulatory Visit (INDEPENDENT_AMBULATORY_CARE_PROVIDER_SITE_OTHER): Payer: BC Managed Care – PPO | Admitting: Psychiatry

## 2021-07-13 ENCOUNTER — Other Ambulatory Visit: Payer: Self-pay

## 2021-07-13 ENCOUNTER — Encounter (HOSPITAL_COMMUNITY): Payer: Self-pay | Admitting: Psychiatry

## 2021-07-13 DIAGNOSIS — F330 Major depressive disorder, recurrent, mild: Secondary | ICD-10-CM

## 2021-07-13 DIAGNOSIS — F411 Generalized anxiety disorder: Secondary | ICD-10-CM | POA: Diagnosis not present

## 2021-07-13 NOTE — Progress Notes (Signed)
Virtual Visit via Video Note  I connected with Amanda Fowler on 07/13/21 at  2:30 PM EDT by a video enabled telemedicine application and verified that I am speaking with the correct person using two identifiers.  Location: Patient: Patient Home Provider: Home Office   I discussed the limitations of evaluation and management by telemedicine and the availability of in person appointments. The patient expressed understanding and agreed to proceed.  History of Present Illness: MDD and GAD   Treatment Plan Goals: 1) To improve self-care routine to decrease environmental stressors and improve hygiene, as evidenced by eating well, cleaning up room weekly, engaging in enjoyable activities and attending needed appointments.  2) Client will take medication as prescribed 7 out of 7 days and will communicate side effects with provider as needed. 3) Client will attend IOP Aftercare Group Therapy 2-4x a month to connect with peers and to apply strategies discussed to improve mental health condition.   Observations/Objective: Counselor met with Client for individual therapy via Webex. Counselor assessed MH symptoms and progress on treatment plan goals, with patient reporting that she had been very sick over the past week and is just starting to feel better, doing typical daily routines.  Client presents with moderate depression and moderate anxiety. Client denied suicidal ideation or self-harm behaviors.   Goal 1-3) Counselor assessed progress on goals using CBT and MI interventions, as well as psychoeducation as needed for gaps in knowledge and understanding. Client reports regression on goal 1, as she has been focused on recovering from virus. Client reports that she plans to clean up and do laundry tomorrow. Client stated she spent time with friends yesterday, processing impact of behaviors and actions from others, how she internalizes, causing negative cogniitons about self. Counselor and Client  used CBT interventions to process thoughts-feelings-behaviors and restructured unhelpful cognitions. Regarding goal 2, Client received informaion on potiential psychiatrist and plans to call this week to set up appointment. Client could benefit from an antidepressant. Client attended group 2 out of 2 sessions this month.    Assessment and Plan: Counselor will continue to meet with patient to address treatment plan goals. Patient will continue to follow recommendations of providers and implement skills learned in session.   Follow Up Instructions: Counselor will send information for next session via Webex.   The patient was advised to call back or seek an in-person evaluation if the symptoms worsen or if the condition fails to improve as anticipated.   I provided 60 minutes of non-face-to-face time during this encounter.     Lise Auer, LCSW

## 2021-07-28 ENCOUNTER — Other Ambulatory Visit: Payer: Self-pay

## 2021-07-28 ENCOUNTER — Ambulatory Visit (INDEPENDENT_AMBULATORY_CARE_PROVIDER_SITE_OTHER): Payer: BC Managed Care – PPO | Admitting: Psychiatry

## 2021-07-28 ENCOUNTER — Encounter (HOSPITAL_COMMUNITY): Payer: Self-pay | Admitting: Psychiatry

## 2021-07-28 DIAGNOSIS — F411 Generalized anxiety disorder: Secondary | ICD-10-CM | POA: Diagnosis not present

## 2021-07-28 DIAGNOSIS — F330 Major depressive disorder, recurrent, mild: Secondary | ICD-10-CM

## 2021-07-28 NOTE — Progress Notes (Signed)
Virtual Visit via Video Note  I connected with Amanda Fowler on 07/28/21 at 12:00 PM EDT by a video enabled telemedicine application and verified that I am speaking with the correct person using two identifiers.  I discussed the limitations of evaluation and management by telemedicine and the availability of in person appointments. The patient expressed understanding and agreed to proceed.    Location: Patient: Patient Home Provider: Home Office   GROUP GOAL: Client will attend IOP Aftercare Group Therapy 2-4x a month to connect with peers and to apply strategies discussed to improve mental health condition.  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 Counselor prompted Patient to share updates on coping skill application and individual therapeutic process in management of mental health. Client reports intentional efforts to maintain mental health.  Counselor prompted Patient to share areas of concern, challenges and identify barriers to meeting current goals. Group topics covered: specific treatments for trauma, securing new providers, body image, self-concepts, OCD symptoms, negative cognitions, insomnia, ADLs, living with others who are not managing mental health.  Patient engaged in discussion, provided feedback for others within the group and took note of additional strategies reviewed and discussed.   Assessment and Plan: Counselor recommends client continue following treatment plan goals, following crisis plan and following up with behavioral health and medical providers as needed. Patient is welcome to join group at next session.   Follow Up Instructions: Counselor to provide link for next session via Webex platform. The patient was advised to call back or seek an in-person evaluation if the symptoms worsen or if the condition fails to improve as anticipated.  I provided 90 minutes  of non-face-to-face time during this encounter.   Lise Auer, LCSW

## 2021-07-30 ENCOUNTER — Other Ambulatory Visit: Payer: Self-pay

## 2021-07-30 ENCOUNTER — Ambulatory Visit (INDEPENDENT_AMBULATORY_CARE_PROVIDER_SITE_OTHER): Payer: BC Managed Care – PPO | Admitting: Psychiatry

## 2021-07-30 DIAGNOSIS — F411 Generalized anxiety disorder: Secondary | ICD-10-CM

## 2021-07-30 DIAGNOSIS — F330 Major depressive disorder, recurrent, mild: Secondary | ICD-10-CM | POA: Diagnosis not present

## 2021-07-31 ENCOUNTER — Encounter (HOSPITAL_COMMUNITY): Payer: Self-pay | Admitting: Psychiatry

## 2021-07-31 NOTE — Progress Notes (Signed)
Virtual Visit via Video Note  I connected with Amanda Fowler on 07/31/21 at  2:30 PM EDT by a video enabled telemedicine application and verified that I am speaking with the correct person using two identifiers.  Location: Patient: Patient Home Provider: Home Office   I discussed the limitations of evaluation and management by telemedicine and the availability of in person appointments. The patient expressed understanding and agreed to proceed.   History of Present Illness: MDD and GAD   Treatment Plan Goals: 1) To improve self-care routine to decrease environmental stressors and improve hygiene, as evidenced by eating well, cleaning up room weekly, engaging in enjoyable activities and attending needed appointments.  2) Client will take medication as prescribed 7 out of 7 days and will communicate side effects with provider as needed. 3) Client will attend IOP Aftercare Group Therapy 2-4x a month to connect with peers and to apply strategies discussed to improve mental health condition.   Observations/Objective: Counselor met with Client for individual therapy via Webex. Counselor assessed MH symptoms and progress on treatment plan goals, with patient reporting on improvements in functioning and communication with support system. Client presents with moderate depression and moderate anxiety. Client denied suicidal ideation or self-harm behaviors.   Goal 1-3) Counselor assessed progress on goals using CBT and MI interventions, as well as psychoeducation as needed for gaps in knowledge and understanding. Client reports improvement in daily functioning. 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 Client is currently in search of a new provider for medications. Client not taking any medications at this time. Client and Counselor spent remainder of time together discussing  body images concerns and impact of mother dismissing Client feelings and experiences. Client reports mother denied willingness to participate in sessions at this time.    Client attended group 1 out of 1 sessions this month.    Assessment and Plan: Counselor will continue to meet with patient to address treatment plan goals. Patient will continue to follow recommendations of providers and implement skills learned in session.   Follow Up Instructions: Counselor will send information for next session via Webex.   The patient was advised to call back or seek an in-person evaluation if the symptoms worsen or if the condition fails to improve as anticipated.   I provided 60 minutes of non-face-to-face time during this encounter.     Lise Auer, LCSW

## 2021-08-25 ENCOUNTER — Encounter (HOSPITAL_COMMUNITY): Payer: Self-pay | Admitting: Psychiatry

## 2021-08-25 ENCOUNTER — Ambulatory Visit (INDEPENDENT_AMBULATORY_CARE_PROVIDER_SITE_OTHER): Payer: BC Managed Care – PPO | Admitting: Psychiatry

## 2021-08-25 ENCOUNTER — Other Ambulatory Visit: Payer: Self-pay

## 2021-08-25 DIAGNOSIS — F330 Major depressive disorder, recurrent, mild: Secondary | ICD-10-CM | POA: Diagnosis not present

## 2021-08-25 DIAGNOSIS — F411 Generalized anxiety disorder: Secondary | ICD-10-CM

## 2021-08-25 NOTE — Progress Notes (Signed)
Virtual Visit via Video Note  I connected with Amanda Fowler on 08/25/21 at  1:00 PM EDT by a video enabled telemedicine application and verified that I am speaking with the correct person using two identifiers.  I discussed the limitations of evaluation and management by telemedicine and the availability of in person appointments. The patient expressed understanding and agreed to proceed.    Location: Patient: Patient Home Provider: Home Office   GROUP GOAL: Client will attend IOP Aftercare Group Therapy 2-4x a month to connect with peers and to apply strategies discussed to improve mental health condition.  History of Present Illness: MDD GAD  Observations/Objective: Counselor met with Patient in the context of Group Therapy, via Webex. Counselor prompted Patient to share updates on coping skill application and individual therapeutic process in management of mental health. Client reports intentional efforts to maintain mental health.  Counselor prompted Patient to share areas of concern, challenges and identify barriers to meeting current goals. Group topics covered: specialized treatment process and outcomes, continuity of care, community resources, positive affirmations, grief work, boundaries, regaining self-confidence, combating depression and anxiety with coping strategies.  Patient engaged in discussion, provided feedback for others within the group and took note of additional strategies reviewed and discussed.   Assessment and Plan: Counselor recommends client continue following treatment plan goals, following crisis plan and following up with behavioral health and medical providers as needed. Patient is welcome to join group at next session.   Follow Up Instructions: Counselor to provide link for next session via Webex platform. The patient was advised to call back or seek an in-person evaluation if the symptoms worsen or if the condition fails to improve as anticipated.  I  provided 60 minutes of non-face-to-face time during this encounter.   Lise Auer, LCSW

## 2021-09-01 ENCOUNTER — Ambulatory Visit (INDEPENDENT_AMBULATORY_CARE_PROVIDER_SITE_OTHER): Payer: BC Managed Care – PPO | Admitting: Psychiatry

## 2021-09-01 ENCOUNTER — Other Ambulatory Visit: Payer: Self-pay

## 2021-09-01 DIAGNOSIS — F330 Major depressive disorder, recurrent, mild: Secondary | ICD-10-CM

## 2021-09-01 DIAGNOSIS — F411 Generalized anxiety disorder: Secondary | ICD-10-CM | POA: Diagnosis not present

## 2021-09-03 ENCOUNTER — Encounter (HOSPITAL_COMMUNITY): Payer: Self-pay | Admitting: Psychiatry

## 2021-09-03 NOTE — Progress Notes (Signed)
Virtual Visit via Video Note  I connected with Amanda Fowler on 09/01/21 at  1:00 PM EDT by a video enabled telemedicine application and verified that I am speaking with the correct person using two identifiers.  I discussed the limitations of evaluation and management by telemedicine and the availability of in person appointments. The patient expressed understanding and agreed to proceed.    Location: Patient: Patient Home Provider: Home Office   GROUP GOAL: Client will attend IOP Aftercare Group Therapy 2-4x a month to connect with peers and to apply strategies discussed to improve mental health condition.  History of Present Illness: MDD GAD  Observations/Objective: Counselor met with Patient in the context of Group Therapy, via Webex. Counselor prompted Patient to share updates on coping skill application and individual therapeutic process in management of mental health. Client reports intentional efforts to maintain mental health.  Counselor prompted Patient to share areas of concern, challenges and identify barriers to meeting current goals. Group topics covered: passive suicidal thoughts, finding life purpose and meaning, grief and loss, medication side effects, assertive communication, avoiding escaping behaviors, genesite testing and self-compassion.  Patient engaged in discussion, provided feedback for others within the group and took note of additional strategies reviewed and discussed.   Assessment and Plan: Counselor recommends client continue following treatment plan goals, following crisis plan and following up with behavioral health and medical providers as needed. Patient is welcome to join group at next session.   Follow Up Instructions: Counselor to provide link for next session via Webex platform. The patient was advised to call back or seek an in-person evaluation if the symptoms worsen or if the condition fails to improve as anticipated.  I provided 60 minutes of  non-face-to-face time during this encounter.   Lise Auer, LCSW

## 2021-09-08 ENCOUNTER — Ambulatory Visit (INDEPENDENT_AMBULATORY_CARE_PROVIDER_SITE_OTHER): Payer: BC Managed Care – PPO | Admitting: Psychiatry

## 2021-09-08 ENCOUNTER — Other Ambulatory Visit: Payer: Self-pay

## 2021-09-08 DIAGNOSIS — F330 Major depressive disorder, recurrent, mild: Secondary | ICD-10-CM

## 2021-09-10 ENCOUNTER — Encounter (HOSPITAL_COMMUNITY): Payer: Self-pay | Admitting: Psychiatry

## 2021-09-10 NOTE — Progress Notes (Signed)
Virtual Visit via Video Note  I connected with Amanda Fowler on 09/08/21 at  1:00 PM EDT by a video enabled telemedicine application and verified that I am speaking with the correct person using two identifiers.  I discussed the limitations of evaluation and management by telemedicine and the availability of in person appointments. The patient expressed understanding and agreed to proceed.    Location: Patient: Patient Home Provider: Home Office   GROUP GOAL: Client will attend IOP Aftercare Group Therapy 2-4x a month to connect with peers and to apply strategies discussed to improve mental health condition.  History of Present Illness: MDD  Observations/Objective: Counselor met with Patient in the context of Group Therapy, via Webex. Counselor prompted Patient to share updates on coping skill application and individual therapeutic process in management of mental health. Client reports intentional efforts to maintain mental health.  Counselor prompted Patient to share areas of concern, challenges and identify barriers to meeting current goals. Group topics covered: exploring new supports and interests, navigating relationships, communication and boundary setting.  Patient engaged in discussion, provided feedback for others within the group and took note of additional strategies reviewed and discussed.   Counselor shared that today would be one of our last group sessions, as Counselor will be ending time with [facility] effective October 1. Client was understanding and we discussed an appropriate transfer of care. Client to remain established with practice for counseling and psychiatry. Client aware of how to follow up and follow safety plan if needed.   Assessment and Plan: Counselor will meet with patient once more to provider group treatment before discharging or transferring care. Patient will continue to follow recommendations of providers and implement skills learned in session.    Follow Up Instructions: Counselor to send out list of potential providers for Clients to receive group treatment via mail.   The patient was advised to call back or seek an in-person evaluation if the symptoms worsen or if the condition fails to improve as anticipated.  I provided 60 minutes of non-face-to-face time during this encounter.   Lise Auer, LCSW

## 2021-10-14 DIAGNOSIS — F3341 Major depressive disorder, recurrent, in partial remission: Secondary | ICD-10-CM | POA: Diagnosis not present

## 2021-10-14 DIAGNOSIS — F411 Generalized anxiety disorder: Secondary | ICD-10-CM | POA: Diagnosis not present

## 2021-10-14 DIAGNOSIS — E559 Vitamin D deficiency, unspecified: Secondary | ICD-10-CM | POA: Diagnosis not present

## 2021-10-14 DIAGNOSIS — F988 Other specified behavioral and emotional disorders with onset usually occurring in childhood and adolescence: Secondary | ICD-10-CM | POA: Diagnosis not present

## 2022-01-12 DIAGNOSIS — F340 Cyclothymic disorder: Secondary | ICD-10-CM | POA: Diagnosis not present

## 2022-01-12 DIAGNOSIS — F909 Attention-deficit hyperactivity disorder, unspecified type: Secondary | ICD-10-CM | POA: Diagnosis not present

## 2022-01-12 DIAGNOSIS — F411 Generalized anxiety disorder: Secondary | ICD-10-CM | POA: Diagnosis not present

## 2022-01-14 DIAGNOSIS — Z79891 Long term (current) use of opiate analgesic: Secondary | ICD-10-CM | POA: Diagnosis not present

## 2022-02-09 DIAGNOSIS — F909 Attention-deficit hyperactivity disorder, unspecified type: Secondary | ICD-10-CM | POA: Diagnosis not present

## 2022-02-09 DIAGNOSIS — F340 Cyclothymic disorder: Secondary | ICD-10-CM | POA: Diagnosis not present

## 2022-02-09 DIAGNOSIS — F411 Generalized anxiety disorder: Secondary | ICD-10-CM | POA: Diagnosis not present

## 2022-02-18 DIAGNOSIS — F340 Cyclothymic disorder: Secondary | ICD-10-CM | POA: Diagnosis not present

## 2022-02-18 DIAGNOSIS — F411 Generalized anxiety disorder: Secondary | ICD-10-CM | POA: Diagnosis not present

## 2022-02-18 DIAGNOSIS — F909 Attention-deficit hyperactivity disorder, unspecified type: Secondary | ICD-10-CM | POA: Diagnosis not present

## 2022-03-09 DIAGNOSIS — F909 Attention-deficit hyperactivity disorder, unspecified type: Secondary | ICD-10-CM | POA: Diagnosis not present

## 2022-03-09 DIAGNOSIS — F340 Cyclothymic disorder: Secondary | ICD-10-CM | POA: Diagnosis not present

## 2022-03-09 DIAGNOSIS — F411 Generalized anxiety disorder: Secondary | ICD-10-CM | POA: Diagnosis not present

## 2022-04-22 DIAGNOSIS — B070 Plantar wart: Secondary | ICD-10-CM | POA: Diagnosis not present

## 2022-05-04 DIAGNOSIS — F909 Attention-deficit hyperactivity disorder, unspecified type: Secondary | ICD-10-CM | POA: Diagnosis not present

## 2022-05-04 DIAGNOSIS — F340 Cyclothymic disorder: Secondary | ICD-10-CM | POA: Diagnosis not present

## 2022-05-04 DIAGNOSIS — F411 Generalized anxiety disorder: Secondary | ICD-10-CM | POA: Diagnosis not present

## 2022-05-06 DIAGNOSIS — Z79899 Other long term (current) drug therapy: Secondary | ICD-10-CM | POA: Diagnosis not present

## 2022-05-11 DIAGNOSIS — F411 Generalized anxiety disorder: Secondary | ICD-10-CM | POA: Diagnosis not present

## 2022-05-11 DIAGNOSIS — F340 Cyclothymic disorder: Secondary | ICD-10-CM | POA: Diagnosis not present

## 2022-05-11 DIAGNOSIS — F909 Attention-deficit hyperactivity disorder, unspecified type: Secondary | ICD-10-CM | POA: Diagnosis not present

## 2022-07-14 DIAGNOSIS — F909 Attention-deficit hyperactivity disorder, unspecified type: Secondary | ICD-10-CM | POA: Diagnosis not present

## 2022-07-14 DIAGNOSIS — F411 Generalized anxiety disorder: Secondary | ICD-10-CM | POA: Diagnosis not present

## 2022-07-14 DIAGNOSIS — F340 Cyclothymic disorder: Secondary | ICD-10-CM | POA: Diagnosis not present

## 2022-07-20 DIAGNOSIS — F340 Cyclothymic disorder: Secondary | ICD-10-CM | POA: Diagnosis not present

## 2022-07-20 DIAGNOSIS — F411 Generalized anxiety disorder: Secondary | ICD-10-CM | POA: Diagnosis not present

## 2022-07-20 DIAGNOSIS — F909 Attention-deficit hyperactivity disorder, unspecified type: Secondary | ICD-10-CM | POA: Diagnosis not present

## 2022-07-27 DIAGNOSIS — F909 Attention-deficit hyperactivity disorder, unspecified type: Secondary | ICD-10-CM | POA: Diagnosis not present

## 2022-07-27 DIAGNOSIS — F340 Cyclothymic disorder: Secondary | ICD-10-CM | POA: Diagnosis not present

## 2022-07-27 DIAGNOSIS — F411 Generalized anxiety disorder: Secondary | ICD-10-CM | POA: Diagnosis not present

## 2022-08-12 DIAGNOSIS — F340 Cyclothymic disorder: Secondary | ICD-10-CM | POA: Diagnosis not present

## 2022-08-12 DIAGNOSIS — F411 Generalized anxiety disorder: Secondary | ICD-10-CM | POA: Diagnosis not present

## 2022-08-12 DIAGNOSIS — F909 Attention-deficit hyperactivity disorder, unspecified type: Secondary | ICD-10-CM | POA: Diagnosis not present

## 2022-09-16 DIAGNOSIS — F909 Attention-deficit hyperactivity disorder, unspecified type: Secondary | ICD-10-CM | POA: Diagnosis not present

## 2022-09-16 DIAGNOSIS — F411 Generalized anxiety disorder: Secondary | ICD-10-CM | POA: Diagnosis not present

## 2022-09-16 DIAGNOSIS — F340 Cyclothymic disorder: Secondary | ICD-10-CM | POA: Diagnosis not present

## 2022-11-23 DIAGNOSIS — F909 Attention-deficit hyperactivity disorder, unspecified type: Secondary | ICD-10-CM | POA: Diagnosis not present

## 2022-11-23 DIAGNOSIS — F340 Cyclothymic disorder: Secondary | ICD-10-CM | POA: Diagnosis not present

## 2022-11-23 DIAGNOSIS — F411 Generalized anxiety disorder: Secondary | ICD-10-CM | POA: Diagnosis not present

## 2022-12-22 DIAGNOSIS — R0600 Dyspnea, unspecified: Secondary | ICD-10-CM | POA: Diagnosis not present

## 2022-12-22 DIAGNOSIS — Z8616 Personal history of COVID-19: Secondary | ICD-10-CM | POA: Diagnosis not present

## 2022-12-22 DIAGNOSIS — R051 Acute cough: Secondary | ICD-10-CM | POA: Diagnosis not present

## 2022-12-30 DIAGNOSIS — R200 Anesthesia of skin: Secondary | ICD-10-CM | POA: Diagnosis not present

## 2022-12-30 DIAGNOSIS — Z6828 Body mass index (BMI) 28.0-28.9, adult: Secondary | ICD-10-CM | POA: Diagnosis not present

## 2022-12-30 DIAGNOSIS — B078 Other viral warts: Secondary | ICD-10-CM | POA: Diagnosis not present

## 2023-01-20 DIAGNOSIS — F411 Generalized anxiety disorder: Secondary | ICD-10-CM | POA: Diagnosis not present

## 2023-01-20 DIAGNOSIS — F909 Attention-deficit hyperactivity disorder, unspecified type: Secondary | ICD-10-CM | POA: Diagnosis not present

## 2023-01-20 DIAGNOSIS — F340 Cyclothymic disorder: Secondary | ICD-10-CM | POA: Diagnosis not present

## 2023-04-13 DIAGNOSIS — B079 Viral wart, unspecified: Secondary | ICD-10-CM | POA: Diagnosis not present

## 2023-04-13 DIAGNOSIS — Z23 Encounter for immunization: Secondary | ICD-10-CM | POA: Diagnosis not present

## 2023-04-13 DIAGNOSIS — F988 Other specified behavioral and emotional disorders with onset usually occurring in childhood and adolescence: Secondary | ICD-10-CM | POA: Diagnosis not present

## 2023-04-13 DIAGNOSIS — Z0000 Encounter for general adult medical examination without abnormal findings: Secondary | ICD-10-CM | POA: Diagnosis not present

## 2023-04-20 DIAGNOSIS — F411 Generalized anxiety disorder: Secondary | ICD-10-CM | POA: Diagnosis not present

## 2023-04-20 DIAGNOSIS — F340 Cyclothymic disorder: Secondary | ICD-10-CM | POA: Diagnosis not present

## 2023-04-20 DIAGNOSIS — F909 Attention-deficit hyperactivity disorder, unspecified type: Secondary | ICD-10-CM | POA: Diagnosis not present

## 2023-04-26 ENCOUNTER — Ambulatory Visit: Payer: BC Managed Care – PPO | Admitting: Podiatry

## 2023-04-26 ENCOUNTER — Ambulatory Visit: Payer: BC Managed Care – PPO

## 2023-04-26 DIAGNOSIS — M2041 Other hammer toe(s) (acquired), right foot: Secondary | ICD-10-CM

## 2023-04-26 DIAGNOSIS — D2371 Other benign neoplasm of skin of right lower limb, including hip: Secondary | ICD-10-CM | POA: Diagnosis not present

## 2023-04-26 MED ORDER — FLUOROURACIL 5 % EX CREA: TOPICAL_CREAM | Freq: Two times a day (BID) | CUTANEOUS | 1 refills | Status: AC

## 2023-04-26 NOTE — Progress Notes (Signed)
Subjective:  Patient ID: Amanda Fowler, female    DOB: 1996-06-24,  MRN: [id number] HPI Chief Complaint  Patient presents with   Toe Pain    5th toe right - "wart" - had cut off and froze twice, has been back again for about 6 months, using wart remover bandaids at home   New Patient (Initial Visit)    27 y.o. female presents with the above complaint.   ROS: Denies fever chills nausea vomit muscle aches pains calf pain back pain chest pain shortness of breath.  Past Medical History:  Diagnosis Date   ADHD    Anxiety    Depression    Heart murmur    Heart murmur    Migraines    with aura   Past Surgical History:  Procedure Laterality Date   BIOPSY THYROID     benign    Current Outpatient Medications:    albuterol (VENTOLIN HFA) 108 (90 Base) MCG/ACT inhaler, Inhale into the lungs., Disp: , Rfl:    eszopiclone (LUNESTA) 1 MG TABS tablet, TAKE 1 TABLET BY MOUTH EVERY DAY AT BEDTIME FOR SLEEP, Disp: , Rfl:    fluorouracil (EFUDEX) 5 % cream, Apply topically 2 (two) times daily., Disp: 40 g, Rfl: 1   amphetamine-dextroamphetamine (ADDERALL) 10 MG tablet, Take 10 mg by mouth as needed. (Patient not taking: Reported on 06/24/2020), Disp: , Rfl:    ARIPiprazole (ABILIFY) 2 MG tablet, Take 1 tablet (2 mg total) by mouth daily. As an djunct to Effexor-XR: For depression, Disp: 90 tablet, Rfl: 0   doxycycline (VIBRAMYCIN) 100 MG capsule, Take 1 capsule (100 mg total) by mouth 2 (two) times daily., Disp: 14 capsule, Rfl: 0   levonorgestrel (MIRENA) 20 MCG/24HR IUD, 1 each by Intrauterine route once., Disp: , Rfl:    lisdexamfetamine (VYVANSE) 20 MG capsule, Take 20 mg by mouth as needed. , Disp: , Rfl:    propranolol (INDERAL) 10 MG tablet, Take 1 tablet (10 mg total) by mouth 2 (two) times daily. For anxiety, Disp: 180 tablet, Rfl: 0   traZODone (DESYREL) 50 MG tablet, Take 1 tablet (50 mg total) by mouth at bedtime as needed for sleep. (Patient not taking: Reported on  06/24/2020), Disp: 30 tablet, Rfl: 0   venlafaxine XR (EFFEXOR-XR) 37.5 MG 24 hr capsule, Take one capsule daily., Disp: 30 capsule, Rfl: 0  Allergies  Allergen Reactions   Amoxicillin Hives    Has patient had a PCN reaction causing immediate rash, facial/tongue/throat swelling, SOB or lightheadedness with hypotension: Yes Has patient had a PCN reaction causing severe rash involving mucus membranes or skin necrosis: Yes Has patient had a PCN reaction that required hospitalization: No Has patient had a PCN reaction occurring within the last 10 years: Unknown If all of the above answers are "NO", then may proceed with Cephalosporin use.    Review of Systems Objective:  There were no vitals filed for this visit.  General: Well developed, nourished, in no acute distress, alert and oriented x3   Dermatological: Skin is warm, dry and supple bilateral. Nails x 10 are well maintained; remaining integument appears unremarkable at this time. There are no open sores, no preulcerative lesions, no rash or signs of infection present.  She has a verrucoid lesion overlying the PIPJ dorsal lateral aspect of the fifth digit of the right foot and a small 1 on the lateral tuft of the second toe distally.  Both of these demonstrate thrombosed capillaries and skin lines and circumvent the lesion indicative  of verrucoid nature.  Vascular: Dorsalis Pedis artery and Posterior Tibial artery pedal pulses are 2/4 bilateral with immedate capillary fill time. Pedal hair growth present. No varicosities and no lower extremity edema present bilateral.   Neruologic: Grossly intact via light touch bilateral. Vibratory intact via tuning fork bilateral. Protective threshold with Semmes Wienstein monofilament intact to all pedal sites bilateral. Patellar and Achilles deep tendon reflexes 2+ bilateral. No Babinski or clonus noted bilateral.   Musculoskeletal: No gross boney pedal deformities bilateral. No pain, crepitus, or  limitation noted with foot and ankle range of motion bilateral. Muscular strength 5/5 in all groups tested bilateral.  Gait: Unassisted, Nonantalgic.    Radiographs:  None taken  Assessment & Plan:   Assessment: Verruca  Plan: Started her on Efudex cream follow-up with her in 1 to 2 months     Moria Brophy T. [place], [place]

## 2023-06-28 ENCOUNTER — Ambulatory Visit: Payer: BC Managed Care – PPO | Admitting: Podiatry

## 2023-08-22 DIAGNOSIS — Z5181 Encounter for therapeutic drug level monitoring: Secondary | ICD-10-CM | POA: Diagnosis not present

## 2023-08-22 DIAGNOSIS — F340 Cyclothymic disorder: Secondary | ICD-10-CM | POA: Diagnosis not present

## 2023-08-22 DIAGNOSIS — F909 Attention-deficit hyperactivity disorder, unspecified type: Secondary | ICD-10-CM | POA: Diagnosis not present

## 2023-08-22 DIAGNOSIS — F411 Generalized anxiety disorder: Secondary | ICD-10-CM | POA: Diagnosis not present

## 2023-09-19 DIAGNOSIS — Z23 Encounter for immunization: Secondary | ICD-10-CM | POA: Diagnosis not present
